# Patient Record
Sex: Female | Born: 1955 | Race: White | Hispanic: No | Smoking: Current every day smoker
Health system: Southern US, Community
[De-identification: ages and names within clinical notes are randomized; demographics above are authoritative.]

## PROBLEM LIST (undated history)

## (undated) DIAGNOSIS — M810 Age-related osteoporosis without current pathological fracture: Secondary | ICD-10-CM

## (undated) DIAGNOSIS — N879 Dysplasia of cervix uteri, unspecified: Secondary | ICD-10-CM

## (undated) DIAGNOSIS — I10 Essential (primary) hypertension: Secondary | ICD-10-CM

## (undated) DIAGNOSIS — J449 Chronic obstructive pulmonary disease, unspecified: Secondary | ICD-10-CM

## (undated) DIAGNOSIS — E785 Hyperlipidemia, unspecified: Secondary | ICD-10-CM

## (undated) HISTORY — PX: TOTAL ABDOMINAL HYSTERECTOMY W/ BILATERAL SALPINGOOPHORECTOMY: SHX83

## (undated) HISTORY — DX: Age-related osteoporosis without current pathological fracture: M81.0

## (undated) HISTORY — DX: Chronic obstructive pulmonary disease, unspecified: J44.9

## (undated) HISTORY — DX: Dysplasia of cervix uteri, unspecified: N87.9

## (undated) HISTORY — PX: APPENDECTOMY: SHX54

## (undated) HISTORY — DX: Essential (primary) hypertension: I10

## (undated) HISTORY — PX: ABDOMINAL HYSTERECTOMY: SHX81

## (undated) HISTORY — DX: Hyperlipidemia, unspecified: E78.5

---

## 2011-10-26 ENCOUNTER — Emergency Department (INDEPENDENT_AMBULATORY_CARE_PROVIDER_SITE_OTHER): Payer: BC Managed Care – PPO

## 2011-10-26 ENCOUNTER — Emergency Department
Admission: EM | Admit: 2011-10-26 | Discharge: 2011-10-26 | Disposition: A | Discharge status: Home / Self Care | Payer: BC Managed Care – PPO | Source: Home / Self Care

## 2011-10-26 ENCOUNTER — Encounter: Payer: Self-pay | Admitting: Emergency Medicine

## 2011-10-26 DIAGNOSIS — R0781 Pleurodynia: Secondary | ICD-10-CM

## 2011-10-26 DIAGNOSIS — R079 Chest pain, unspecified: Secondary | ICD-10-CM

## 2011-10-26 MED ORDER — HYDROCODONE-ACETAMINOPHEN 5-500 MG PO TABS: 1.0000 | ORAL_TABLET | Freq: Every evening | ORAL | Status: DC | PRN

## 2011-10-26 MED ORDER — TRAMADOL HCL 50 MG PO TABS: ORAL_TABLET | ORAL | Status: DC

## 2011-10-26 NOTE — ED Provider Notes (Signed)
History     CSN: [id number]  Arrival date & time 10/26/11  1541   None     Chief Complaint  Patient presents with  . Chest Pain      HPI Comments: Patient was vigorously doing yardwork about a week ago, including raking leaves. She recalls no injury, but subsequently developed right side rib pain worse with movement and inspiration.  She denies cough, and no fever.  Patient is a 56 y.o. female presenting with chest pain. The history is provided by the patient.  Chest Pain Episode onset: 1 week ago. Chest pain occurs frequently. The chest pain is unchanged. The pain is associated with breathing and coughing. At its most intense, the pain is at 8/10. The severity of the pain is moderate. The quality of the pain is described as aching. The pain does not radiate. Chest pain is worsened by certain positions and deep breathing. Pertinent negatives for primary symptoms include no fever, no fatigue, no syncope, no shortness of breath, no cough, no wheezing, no palpitations, no abdominal pain, no nausea, no vomiting and no dizziness. She tried NSAIDs for the symptoms.     History reviewed. No pertinent past medical history.  Past Surgical History  Procedure Date  . Abdominal hysterectomy   . Total abdominal hysterectomy w/ bilateral salpingoophorectomy     Family History  Problem Relation Age of Onset  . Cancer Mother   . Heart failure Father     History  Substance Use Topics  . Smoking status: Former Games developer  . Smokeless tobacco: Not on file  . Alcohol Use: Yes    OB History    Grav Para Term Preterm Abortions TAB SAB Ect Mult Living                  Review of Systems  Constitutional: Negative for fever and fatigue.  Respiratory: Negative for cough, shortness of breath and wheezing.   Cardiovascular: Positive for chest pain. Negative for palpitations and syncope.  Gastrointestinal: Negative for nausea, vomiting and abdominal pain.  Neurological: Negative for dizziness.    All other systems reviewed and are negative.    Allergies  Erythromycin; Keflex; Penicillins; and Sulfa antibiotics  Home Medications   Current Outpatient Rx  Name Route Sig Dispense Refill  . TRAMADOL HCL 50 MG PO TABS  Take one or two tabs at bedtime as needed for pain Maximum dose= 8 tablets per day 15 tablet 0    BP 167/91  Pulse 76  Temp 97.7 F (36.5 C) (Oral)  Resp 16  Ht 5\' 7"  (1.702 m)  Wt 145 lb (65.772 kg)  BMI 22.71 kg/m2  SpO2 98%  Physical Exam  Nursing note and vitals reviewed. Constitutional: She is oriented to person, place, and time. She appears well-developed and well-nourished. No distress.  HENT:  Head: Normocephalic and atraumatic.  Eyes: Conjunctivae normal are normal. Pupils are equal, round, and reactive to light.  Neck: Neck supple.  Cardiovascular: Normal heart sounds.   Pulmonary/Chest: Effort normal and breath sounds normal. No respiratory distress. She has no wheezes. She has no rales. She exhibits tenderness.  Abdominal: Soft. There is no tenderness.  Musculoskeletal:       Arms:      There is tenderness to palpation right lateral/anterior ribs as noted on diagram  Lymphadenopathy:    She has no cervical adenopathy.  Neurological: She is alert and oriented to person, place, and time.  Skin: Skin is warm and dry. No rash noted.  ED Course  Procedures none   Dg Ribs Unilateral W/chest Right  10/26/2011  *RADIOLOGY REPORT*  Clinical Data: Chest pain in the right lateral and anterior ribs for the past week.  RIGHT RIBS AND CHEST - 3+ VIEW  Comparison: No priors.  Findings: Lung volumes are normal.  No consolidative airspace disease.  No pleural effusions.  No pneumothorax.  No pulmonary nodule or mass noted.  Pulmonary vasculature and the cardiomediastinal silhouette are within normal limits.  Dedicated views of the lower right ribs demonstrate a radiopaque marker placed laterally over the point of maximal tenderness.  No acute  displaced rib fractures are identified.  IMPRESSION: 1. No radiographic evidence of acute cardiopulmonary disease. 2.  No acute displaced rib fractures are noted.   Original Report Authenticated By: Florencia Reasons, M.D.      1. Rib pain on right side       MDM  Rib belt applied.  Rx for tramadol at bedtime. Wear rib belt daytime.  Apply ice pack two or three times daily. Increase Ibuprofen 200mg , 4 tabs every 8 hours with food.  Followup with Dr. Rodney Langton if not improved two weeks.        Lattie Haw, MD 10/29/11 [id number]

## 2011-10-26 NOTE — ED Notes (Signed)
Patient states having upper/lateral right side rib pain x 1 week; associates onset with doing yardwork.

## 2011-11-04 ENCOUNTER — Ambulatory Visit (INDEPENDENT_AMBULATORY_CARE_PROVIDER_SITE_OTHER): Payer: BC Managed Care – PPO | Admitting: Family Medicine

## 2011-11-04 ENCOUNTER — Encounter: Payer: Self-pay | Admitting: Family Medicine

## 2011-11-04 VITALS — BP 169/91 | HR 81 | Wt 155.0 lb

## 2011-11-04 DIAGNOSIS — E8941 Symptomatic postprocedural ovarian failure: Secondary | ICD-10-CM

## 2011-11-04 DIAGNOSIS — E8940 Asymptomatic postprocedural ovarian failure: Secondary | ICD-10-CM

## 2011-11-04 DIAGNOSIS — I10 Essential (primary) hypertension: Secondary | ICD-10-CM | POA: Insufficient documentation

## 2011-11-04 MED ORDER — LISINOPRIL 20 MG PO TABS: 20.0000 mg | ORAL_TABLET | Freq: Every day | ORAL | Status: DC

## 2011-11-04 MED ORDER — ASPIRIN EC 81 MG PO TBEC: 81.0000 mg | DELAYED_RELEASE_TABLET | Freq: Every day | ORAL | Status: AC

## 2011-11-04 NOTE — Progress Notes (Signed)
CC: Sue Smith is a 56 y.o. female is here for Establish Care and Hypertension   Subjective: HPI:  Pleasant 56 year old here to establish care. She has the following concerns  She was seen in early October in urgent care Center and noted to have an elevated blood pressure. Since then she's been taking her blood pressure at home and in the community and notes systolics between 140 160, diastolics between 90 and 100. She has never been on antihypertensives. She tries to restrict her salt intake, avoiding canned and processed foods. She's a daily walking routine for exercise. She denies motor or sensory disturbances, irregular heartbeat, chest pain, shortness of breath, orthopnea, nor peripheral edema. She stopped smoking in 2012, cold [place].  She tells me that ever since the removal of both ovaries  in the late 1990s she suffered from hot flashes most days of the week. She was on estrogen and progesterone for matter of years but found that this was causing anxiety attacks, which had no longer been present ever since she stopped taking hormone replacement therapy. She wonders if there's anything on the market these days for hot flashes.   Her mother died from colon cancer at age 50, the patient has had colonoscopies every 5 years since the age of 74 without any abnormalities.  Review Of Systems Outlined In HPI  Past Medical History  Diagnosis Date  . Cervical dysplasia      Family History  Problem Relation Age of Onset  . Cancer Mother   . Heart failure Father      History  Substance Use Topics  . Smoking status: Former Smoker    Quit date: 01/14/2010  . Smokeless tobacco: Not on file  . Alcohol Use: 1.0 oz/week    2 drink(s) per week     Objective: Filed Vitals:   11/04/11 1408  BP: 169/91  Pulse: 81    General: Alert and Oriented, No Acute Distress HEENT: Pupils equal, round, reactive to light. Conjunctivae clear.  External ears unremarkable, canals clear with  intact TMs with appropriate landmarks.  Middle ear appears open without effusion. Pink inferior turbinates.  Moist mucous membranes, pharynx without inflammation nor lesions.  Neck supple without palpable lymphadenopathy nor abnormal masses. Lungs: Clear to auscultation bilaterally, no wheezing/ronchi/rales.  Comfortable work of breathing. Good air movement. Cardiac: Regular rate and rhythm. Normal S1/S2.  No murmurs, rubs, nor gallops.  No carotid bruits Extremities: No peripheral edema.  Strong peripheral pulses.  Mental Status: No depression, anxiety, nor agitation. Skin: Warm and dry.  Assessment & Plan: Sarai was seen today for establish care and hypertension.  Diagnoses and associated orders for this visit:  Essential hypertension - lisinopril (PRINIVIL,ZESTRIL) 20 MG tablet; Take 1 tablet (20 mg total) by mouth daily. - TSH - BASIC METABOLIC PANEL WITH GFR  Surgical menopause    We discussed different classes of antihypertensives, joint decision to start ACE inhibitor. If her basic metabolic panel is without abnormalities a call her tomorrow to start lisinopril. She'll return in 1-2 weeks for a blood pressure recheck and a complete physical exam, fasting. I've asked her to start a baby aspirin.  Return in about 2 weeks (around 11/18/2011) for cpe.

## 2011-11-05 LAB — BASIC METABOLIC PANEL WITH GFR
BUN: 11 mg/dL (ref 6–23)
CO2: 32 mEq/L (ref 19–32)
Chloride: 104 mEq/L (ref 96–112)
Creat: 0.84 mg/dL (ref 0.50–1.10)
Glucose, Bld: 104 mg/dL — ABNORMAL HIGH (ref 70–99)
Potassium: 4.2 mEq/L (ref 3.5–5.3)

## 2011-11-18 ENCOUNTER — Ambulatory Visit (INDEPENDENT_AMBULATORY_CARE_PROVIDER_SITE_OTHER): Payer: BC Managed Care – PPO | Admitting: Family Medicine

## 2011-11-18 ENCOUNTER — Encounter: Payer: Self-pay | Admitting: Family Medicine

## 2011-11-18 VITALS — BP 140/79 | HR 67 | Ht 67.0 in | Wt 154.0 lb

## 2011-11-18 DIAGNOSIS — Z8739 Personal history of other diseases of the musculoskeletal system and connective tissue: Secondary | ICD-10-CM

## 2011-11-18 DIAGNOSIS — I10 Essential (primary) hypertension: Secondary | ICD-10-CM

## 2011-11-18 DIAGNOSIS — Z0000 Encounter for general adult medical examination without abnormal findings: Secondary | ICD-10-CM

## 2011-11-18 DIAGNOSIS — R232 Flushing: Secondary | ICD-10-CM

## 2011-11-18 MED ORDER — ESTRADIOL 1 MG PO TABS: ORAL_TABLET | ORAL | Status: DC

## 2011-11-18 NOTE — Patient Instructions (Addendum)
Dr. Coda Mathey's General Advice Following Your Complete Physical Exam  The Benefits of Regular Exercise: Unless you suffer from an uncontrolled cardiovascular condition, studies strongly suggest that regular exercise and physical activity will add to both the quality and length of your life.  The World Health Organization recommends 150 minutes of moderate intensity aerobic activity every week.  This is best split over 3-4 days a week, and can be as simple as a brisk walk for just over 35 minutes "most days of the week".  This type of exercise has been shown to lower LDL-Cholesterol, lower average blood sugars, lower blood pressure, lower cardiovascular disease risk, improve memory, and increase one's overall sense of wellbeing.  The addition of anaerobic (or "strength training") exercises offers additional benefits including but not limited to increased metabolism, prevention of osteoporosis, and improved overall cholesterol levels.  How Can I Strive For A Low-Fat Diet?: Current guidelines recommend that 25-35 percent of your daily energy (food) intake should come from fats.  One might ask how can this be achieved without having to dissect each meal on a daily basis?  Switch to skim or 1% milk instead of whole milk.  Focus on lean meats such as ground turkey, fresh fish, baked chicken, and lean cuts of beef as your source of dietary protein.  Consume less than 300mg/day of dietary cholesterol.  Limit trans fatty acid consumption primarily by limiting synthetic trans fats such as partially hydrogenated oils (Ex: fried fast foods).  Focus efforts on reducing your intake of "solid" fats (Ex: Butter).  Substitute olive or vegetable oil for solid fats where possible.  Moderation of Salt Intake: Provided you don't carry a diagnosis of congestive heart failure nor renal failure, I recommend a daily allowance of no more than 2300 mg of salt (sodium).  Keeping under this daily goal is associated with a  decreased risk of cardiovascular events, creeping above it can lead to elevated blood pressures and increases your risk of cardiovascular events.  Milligrams (mg) of salt is listed on all nutrition labels, and your daily intake can add up faster than you think.  Most canned and frozen dinners can pack in over half your daily salt allowance in one meal.    Lifestyle Health Risks: Certain lifestyle choices carry specific health risks.  As you may already know, tobacco use has been associated with increasing one's risk of cardiovascular disease, pulmonary disease, numerous cancers, among many other issues.  What you may not know is that there are medications and nicotine replacement strategies that can more than double your chances of successfully quitting.  I would be thrilled to help manage your quitting strategy if you currently use tobacco products.  When it comes to alcohol use, I've yet to find an "ideal" daily allowance.  Provided an individual does not have a medical condition that is exacerbated by alcohol consumption, general guidelines determine "safe drinking" as no more than two standard drinks for a man or no more than one standard drink for a female per day.  However, much debate still exists on whether any amount of alcohol consumption is technically "safe".  My general advice, keep alcohol consumption to a minimum for general health promotion.  If you or others believe that alcohol, tobacco, or recreational drug use is interfering with your life, I would be happy to provide confidential counseling regarding treatment options.  General "Over The Counter" Nutrition Advice: Postmenopausal women should aim for a daily calcium intake of 1200 mg, however a significant   portion of this might already be provided by diets including milk, yogurt, cheese, and other dairy products.  Vitamin D has been shown to help preserve bone density, prevent fatigue, and has even been shown to help reduce falls in the  elderly.  Ensuring a daily intake of 800 Units of Vitamin D is a good place to start to enjoy the above benefits, we can easily check your Vitamin D level to see if you'd potentially benefit from supplementation beyond 800 Units a day.  Folic Acid intake should be of particular concern to women of childbearing age.  Daily consumption of 400-800 mcg of Folic Acid is recommended to minimize the chance of spinal cord defects in a fetus should pregnancy occur.    For many adults, accidents still remain one of the most common culprits when it comes to cause of death.  Some of the simplest but most effective preventitive habits you can adopt include regular seatbelt use, proper helmet use, securing firearms, and regularly testing your smoke and carbon monoxide detectors.  Ryu Cerreta B. Jahmire Ruffins DO [facility] [address], [address] [place], [place] [address] Phone: [phone]  

## 2011-11-18 NOTE — Progress Notes (Signed)
CC: Sue Smith is a 56 y.o. female is here for Annual Exam   Colonoscopy: Her mother died from colon cancer at age 64, the patient has had colonoscopies every 5 years since the age of 89 without any abnormalities. She has one planned for next year.  Papsmear: Total hysterectomy and has had 5 negative Pap smears since, not indicated today. Mammogram: Order placed for screening mammogram, no history of abnormalities.  DEXA: She tells me she had a DEXA scan years ago that showed osteoporosis, she's not been on medication for this, she'll bring in records reflecting this for my review.  Influenza Vaccine: Declines Pneumovax: Believes 2009 last admin Td/Tdap: Believes 2009 last admin Zoster: (Start 56 yo)  Subjective: HPI:  Here for complete physical exam and would like to discuss restarting hormone replacement therapy. When she had her total abdominal hysterectomy both ovaries were removed, she was on estrogen and progesterone for a few months but it caused anxiety so she stopped both. She has no history of blood clots nor family history or personal history of breast cancer. She would like to restart estrogen. She reports numerous daily hot flashes that in her quality of life, often interrupting sleep.  Over the past 2 weeks have you been bothered by: - Little interest or pleasure in doing things: No - Feeling down depressed or hopeless: No  She tries to watch what she eats, no formal exercise program but tries to stay active with yard work and chores. Former smoker, less than social alcohol use.  She had a cough after starting on lisinopril, she believes it is getting better but it returns or fail to improve she'll call the office for consideration of starting an ARB instead of lisinopril. She brings in blood pressures from home with systolics ranging from 115 140, and diastolics in the normotensive range.   Review of Systems - General ROS: negative for - chills, fever, night sweats,  weight gain or weight loss Ophthalmic ROS: negative for - decreased vision Psychological ROS: negative for - anxiety or depression ENT ROS: negative for - hearing change, nasal congestion, tinnitus or allergies Hematological and Lymphatic ROS: negative for - bleeding problems, bruising or swollen lymph nodes Breast ROS: negative Respiratory ROS: no shortness of breath, or wheezing Cardiovascular ROS: no chest pain or dyspnea on exertion Gastrointestinal ROS: no abdominal pain, change in bowel habits, or black or bloody stools Genito-Urinary ROS: negative for - genital discharge, genital ulcers, incontinence or abnormal bleeding from genitals Musculoskeletal ROS: negative for - joint pain or muscle pain Neurological ROS: negative for - headaches or memory loss Dermatological ROS: negative for lumps, mole changes, rash and skin lesion changes  Past Medical History  Diagnosis Date  . Cervical dysplasia      Family History  Problem Relation Age of Onset  . Cancer Mother   . Heart failure Father      History  Substance Use Topics  . Smoking status: Former Smoker    Quit date: 01/14/2010  . Smokeless tobacco: Not on file  . Alcohol Use: 1.0 oz/week    2 drink(s) per week     Objective: Filed Vitals:   11/18/11 0824  BP: 140/79  Pulse: 67    GGeneral: No Acute Distress HEENT: Atraumatic, normocephalic, conjunctivae normal without scleral icterus.  No nasal discharge, hearing grossly intact, TMs with good landmarks bilaterally with no middle ear abnormalities, posterior pharynx clear without oral lesions. Neck: Supple, trachea midline, no cervical nor supraclavicular adenopathy. Pulmonary:  Clear to auscultation bilaterally without wheezing, rhonchi, nor rales. Cardiac: Regular rate and rhythm.  No murmurs, rubs, nor gallops. No peripheral edema.  2+ peripheral pulses bilaterally. Abdomen: Bowel sounds normal.  No masses.  Non-tender without rebound.  Negative Murphy's sign. GU:  Deferred by patient  MSK: Grossly intact, no signs of weakness.  Full strength throughout upper and lower extremities.  Full ROM in upper and lower extremities.  No midline spinal tenderness. Neuro: Gait unremarkable, CN II-XII grossly intact.  C5-C6 Reflex 2/4 Bilaterally, L4 Reflex 2/4 Bilaterally.  Cerebellar function intact. Skin: No rashes. Psych: Alert and oriented to person/place/time.  Thought process normal. No anxiety/depression.   Assessment & Plan: [name] was seen today for annual exam.  Diagnoses and associated orders for this visit:  Annual physical exam - MM Digital Screening; Future - Lipid panel - HgB A1c  History of osteoporosis  Vasomotor flushing  Essential hypertension  Other Orders - Discontinue: estradiol (ESTRACE) 1 MG tablet; One daily for 21 days, followed by 7 days without, then repeat. - estradiol (ESTRACE) 1 MG tablet; One daily for 21 days, followed by 7 days without, then repeat.    Depression screen negative, we'll reassess next year. We discussed healthy lifestyle options and handout was given to the patient stressing the importance of calcium and vitamin D intake. She'll be dropping off outside DEXA scan for my review, we'll need to see if she should should start a bisphosphonate. Return in 3 months for blood pressure management, sooner if the above labs necessitate a sooner followup.  Return in about 3 months (around 02/18/2012).

## 2011-11-19 LAB — HEMOGLOBIN A1C
Hgb A1c MFr Bld: 5.7 % — ABNORMAL HIGH (ref <5.7)
Mean Plasma Glucose: 117 mg/dL — ABNORMAL HIGH (ref <117)

## 2011-11-19 LAB — LIPID PANEL
HDL: 49 mg/dL (ref >39)
Total CHOL/HDL Ratio: 4.7 Ratio
VLDL: 26 mg/dL (ref 0–40)

## 2011-11-20 ENCOUNTER — Telehealth: Payer: Self-pay | Admitting: Family Medicine

## 2011-11-20 DIAGNOSIS — R7303 Prediabetes: Secondary | ICD-10-CM | POA: Insufficient documentation

## 2011-11-20 NOTE — Telephone Encounter (Signed)
[place] 3% Risk Goal LDL < 160, she does not want to start a statin to minimize her risk by approximately one fifth.  She already takes an asprin.  Stressed the importance of regular exercise.  RTC 3 months. Discused pre-diabetes

## 2011-11-26 ENCOUNTER — Encounter: Payer: Self-pay | Admitting: Family Medicine

## 2012-02-10 ENCOUNTER — Ambulatory Visit (INDEPENDENT_AMBULATORY_CARE_PROVIDER_SITE_OTHER): Payer: BC Managed Care – PPO | Admitting: Family Medicine

## 2012-02-10 ENCOUNTER — Encounter: Payer: Self-pay | Admitting: Family Medicine

## 2012-02-10 VITALS — BP 143/82 | HR 79 | Temp 98.0°F | Wt 155.0 lb

## 2012-02-10 DIAGNOSIS — Z8739 Personal history of other diseases of the musculoskeletal system and connective tissue: Secondary | ICD-10-CM

## 2012-02-10 DIAGNOSIS — I10 Essential (primary) hypertension: Secondary | ICD-10-CM

## 2012-02-10 DIAGNOSIS — R300 Dysuria: Secondary | ICD-10-CM

## 2012-02-10 DIAGNOSIS — M549 Dorsalgia, unspecified: Secondary | ICD-10-CM

## 2012-02-10 DIAGNOSIS — Z800 Family history of malignant neoplasm of digestive organs: Secondary | ICD-10-CM

## 2012-02-10 LAB — POCT URINALYSIS DIPSTICK
Nitrite, UA: NEGATIVE
Spec Grav, UA: 1.02
Urobilinogen, UA: 0.2
pH, UA: 6.5

## 2012-02-10 MED ORDER — OXYCODONE-ACETAMINOPHEN 5-325 MG PO TABS: 1.0000 | ORAL_TABLET | Freq: Four times a day (QID) | ORAL | Status: DC | PRN

## 2012-02-10 MED ORDER — CIPROFLOXACIN HCL 250 MG PO TABS: ORAL_TABLET | ORAL | Status: AC

## 2012-02-10 NOTE — Progress Notes (Signed)
CC: Sue Smith is a 57 y.o. female is here for Back Pain and Dysuria   Subjective: HPI:  Patient complains of dysuria. Has been present for 3 days, worsening on a daily basis. She describes it as a discomfort in her lower back that radiates around the pelvis ending at the urethra. Described as a moderate pressure and burning sensation that is severe with urinating. Complaints of fatigue since this started. Has tried Azo, acetaminophen, no improvement whatsoever. Complains of the odor to her urine but denies clots, nor change in color or  consistency otherwise of her urine.  History of essential hypertension: Is taking lisinopril a daily basis. Reports systolics at home of 120 to 130. Reports diastolics in the 70s. Denies chest pain, shortness of breath, orthopnea, peripheral edema, cough, PND, nor facial or lip swelling  History of osteoporosis per her report. She is taking 1200 mg calcium, 800 units vitamin D on a daily basis. She is unsure what her T-scores were, she has been meaning to Provide [place] with her most recent DEXA scan. She's not taking and has not taken bisphosphonates.. she denies a history of fractures.      Review Of Systems Outlined In HPI  Past Medical History  Diagnosis Date  . Cervical dysplasia      Family History  Problem Relation Age of Onset  . Cancer Mother   . Heart failure Father      History  Substance Use Topics  . Smoking status: Former Smoker    Quit date: 01/14/2010  . Smokeless tobacco: Not on file  . Alcohol Use: 1.0 oz/week    2 drink(s) per week     Objective: Filed Vitals:   02/10/12 0847  BP: 143/82  Pulse: 79  Temp: 98 F (36.7 C)    General: Alert and Oriented, No Acute Distress HEENT: Pupils equal, round, reactive to light. Conjunctivae clear.   moist mucous membranes  Lungs: Clear to auscultation bilaterally, no wheezing/ronchi/rales.  Comfortable work of breathing. Good air movement. Cardiac: Regular rate and rhythm.  Normal S1/S2.  No murmurs, rubs, nor gallops.   Abdomen:  soft and nontender  Back: No CVA tenderness nor midline pain the spinous processes  Extremities: No peripheral edema.  Strong peripheral pulses.  Mental Status: No depression, anxiety, nor agitation. Skin: Warm and dry.  Assessment & Plan: Sue Smith was seen today for back pain and dysuria.  Diagnoses and associated orders for this visit:  Back pain - Urinalysis Dipstick - Urine Culture  Dysuria - Urinalysis Dipstick - Urine Culture - ciprofloxacin (CIPRO) 250 MG tablet; Take one by mouth twice a day for five days. - oxyCODONE-acetaminophen (PERCOCET/ROXICET) 5-325 MG per tablet; Take 1 tablet by mouth every 6 (six) hours as needed for pain.  Essential hypertension  History of osteoporosis  Family history of colon cancer    Urinary tract infection: UA with small blood, low suspicion for nephrolithiasis. Empiric treatment with Cipro, will follow culture. Given severity of pain small regimen of Percocet.Signs and symptoms requring emergent/urgent reevaluation were discussed with the patient. History of osteoporosis: I've asked her to provide [place] with her most recent T-scores or results and if less than -2.5 entry diagnosis of osteoporosis she will consider bisphosphonate. Continue calcium and vitamin D Essential hypertension: Controlled, stable per home blood pressure report.  Return if symptoms worsen or fail to improve.

## 2012-02-12 LAB — URINE CULTURE: Organism ID, Bacteria: NO GROWTH

## 2012-02-29 ENCOUNTER — Other Ambulatory Visit: Payer: Self-pay

## 2012-03-25 ENCOUNTER — Encounter: Payer: Self-pay | Admitting: Family Medicine

## 2012-03-25 DIAGNOSIS — R232 Flushing: Secondary | ICD-10-CM

## 2012-03-25 MED ORDER — ESTRADIOL 0.5 MG PO TABS: ORAL_TABLET | ORAL | Status: DC

## 2012-03-25 NOTE — Telephone Encounter (Signed)
Mrs. Hauk, This might actually be a reflection that the level of estrogen is too much especially since you're having hot flashes while taking it.  I've called in a lower dose to your [facility], 0.5mg  instead of 1mg .  You can take this on a daily basis, no longer taking seven days off since that was making matters worse.  Let me know if the hot flashes behave the same way a month after new regimen.  Take care, -Laren Boom

## 2012-03-26 ENCOUNTER — Encounter: Payer: Self-pay | Admitting: Family Medicine

## 2012-03-26 DIAGNOSIS — Z860100 Personal history of colon polyps, unspecified: Secondary | ICD-10-CM | POA: Insufficient documentation

## 2012-06-30 ENCOUNTER — Encounter: Payer: Self-pay | Admitting: Family Medicine

## 2012-07-02 ENCOUNTER — Encounter: Payer: Self-pay | Admitting: Family Medicine

## 2012-08-06 ENCOUNTER — Encounter: Payer: Self-pay | Admitting: Family Medicine

## 2012-08-06 ENCOUNTER — Telehealth: Payer: Self-pay | Admitting: Family Medicine

## 2012-08-06 ENCOUNTER — Ambulatory Visit (INDEPENDENT_AMBULATORY_CARE_PROVIDER_SITE_OTHER): Payer: BC Managed Care – PPO | Admitting: Family Medicine

## 2012-08-06 ENCOUNTER — Ambulatory Visit (INDEPENDENT_AMBULATORY_CARE_PROVIDER_SITE_OTHER): Payer: BC Managed Care – PPO

## 2012-08-06 VITALS — BP 156/91 | HR 75 | Wt 155.0 lb

## 2012-08-06 DIAGNOSIS — M545 Low back pain, unspecified: Secondary | ICD-10-CM

## 2012-08-06 DIAGNOSIS — Z8739 Personal history of other diseases of the musculoskeletal system and connective tissue: Secondary | ICD-10-CM

## 2012-08-06 DIAGNOSIS — M5137 Other intervertebral disc degeneration, lumbosacral region: Secondary | ICD-10-CM

## 2012-08-06 MED ORDER — OXYCODONE-ACETAMINOPHEN 5-325 MG PO TABS: 1.0000 | ORAL_TABLET | Freq: Two times a day (BID) | ORAL | Status: DC | PRN

## 2012-08-06 MED ORDER — PREDNISONE 20 MG PO TABS: ORAL_TABLET | ORAL | Status: AC

## 2012-08-06 NOTE — Telephone Encounter (Signed)
Pt.notified

## 2012-08-06 NOTE — Telephone Encounter (Signed)
Sue Smith,  Will you please let [name] know that her xray does not show any sign of a compression fracture, I"d like her to start a prednisone taper that i have sent to [facility].  The xray does show some degenerative disc disease where I reproduced her pain when pressing on her back, this is likely the cause of her pain and should respond to prednisone.  I'd like her to f/u once she's doen with the taper.

## 2012-08-06 NOTE — Progress Notes (Signed)
CC: Sue Smith is a 57 y.o. female is here for Back Pain   Subjective: HPI:  Patient complains of low back pain that has been present for 2 months slowly worsening moderate severity now is on a daily basis. It is worse when standing for long periods of time it is localized to the midline of the back and will radiate through the left or right buttock into respective hamstrings never passing the knee. Pain is improved with flexion pain is not reproduced with extension and is relatively unchanged in that position. She denies saddle paresthesia nor bowel or bladder incontinence nor motor or sensory disturbances she denies recent or remote trauma. She does have a history of osteoporosis continues to take calcium and vitamin D daily. Pain can be occurring anytime of the day. Is described only as a pain. She denies night sweats, unintentional weight loss, abdominal pain, pelvic pain, cough, shortness of breath nor constipation. She has been using ibuprofen 800 mg 3 times a day without improvement of pain   Review Of Systems Outlined In HPI  Past Medical History  Diagnosis Date  . Cervical dysplasia      Family History  Problem Relation Age of Onset  . Cancer Mother   . Heart failure Father      History  Substance Use Topics  . Smoking status: Former Smoker    Quit date: 01/14/2010  . Smokeless tobacco: Not on file  . Alcohol Use: 1.0 oz/week    2 drink(s) per week     Objective: Filed Vitals:   08/06/12 1028  BP: 156/91  Pulse: 75    General: Alert and Oriented, No Acute Distress HEENT: Pupils equal, round, reactive to light. Conjunctivae clear.  Moist mucous membranes pharynx unremarkable Lungs: Clear to auscultation bilaterally, no wheezing/ronchi/rales.  Comfortable work of breathing. Good air movement. Cardiac: Regular rate and rhythm. Normal S1/S2.  No murmurs, rubs, nor gallops.   Extremities: No peripheral edema.  Strong peripheral pulses.  Full range of motion  strength bilaterally L4-S1 DTRs two over four. Straight leg raise is negative, long roll negative FABER/FADIR negative, bilaterally Back: Midline spinous process tenderness at L5 no paraspinal tenderness nor sacroiliac pain with deep palpation. Pain is not reproduced with flexion or extension however stork test with left leg planted is positive Mental Status: No depression, anxiety, nor agitation. Skin: Warm and dry.  Assessment & Plan: Sue Smith was seen today for back pain.  Diagnoses and associated orders for this visit:  History of osteoporosis - DG Lumbar Spine Complete; Future  Low back pain - oxyCODONE-acetaminophen (PERCOCET/ROXICET) 5-325 MG per tablet; Take 1 tablet by mouth every 12 (twelve) hours as needed for pain. - DG Lumbar Spine Complete; Future    Discussed with patient due to history of osteoporosis she is at risk for compression fracture I would like to rule this out she will obtain lumbar films today. If this is ruled out I'm still suspicious of facet pathology, she was given handout on home physical therapy medication regimen beyond Percocet will depend on plain films.   Return in about 4 weeks (around 09/03/2012), or if symptoms worsen or fail to improve.

## 2012-09-16 ENCOUNTER — Ambulatory Visit (INDEPENDENT_AMBULATORY_CARE_PROVIDER_SITE_OTHER): Payer: BC Managed Care – PPO | Admitting: Family Medicine

## 2012-09-16 ENCOUNTER — Encounter: Payer: Self-pay | Admitting: Family Medicine

## 2012-09-16 VITALS — BP 141/89 | HR 82 | Temp 97.8°F | Wt 154.0 lb

## 2012-09-16 DIAGNOSIS — M545 Low back pain, unspecified: Secondary | ICD-10-CM

## 2012-09-16 DIAGNOSIS — R300 Dysuria: Secondary | ICD-10-CM

## 2012-09-16 DIAGNOSIS — N390 Urinary tract infection, site not specified: Secondary | ICD-10-CM

## 2012-09-16 LAB — POCT URINALYSIS DIPSTICK
Bilirubin, UA: NEGATIVE
Glucose, UA: NEGATIVE
Ketones, UA: NEGATIVE
Nitrite, UA: POSITIVE
pH, UA: 6.5

## 2012-09-16 MED ORDER — CIPROFLOXACIN HCL 500 MG PO TABS: 500.0000 mg | ORAL_TABLET | Freq: Two times a day (BID) | ORAL | Status: DC

## 2012-09-16 MED ORDER — OXYCODONE-ACETAMINOPHEN 5-325 MG PO TABS: 1.0000 | ORAL_TABLET | Freq: Two times a day (BID) | ORAL | Status: DC | PRN

## 2012-09-16 NOTE — Progress Notes (Signed)
CC: Sue Smith is a 57 y.o. female is here for Dysuria   Subjective: HPI:  Patient complains of dysuria of moderate severity described as a burning at urethra along with sensation of bladder spasms that has been worsening over the past 3 days it is present all hours of the day, she has tried peridium without much benefit, she had left over oxycodone from her back pain which completely resolved her urinary complaints however only for approximately 2-3 hours nothing else seems to make better or worse. This morning it began radiating up the right lower quadrant. She has a history of pyelonephritis years ago requiring hospitalization. Currently she denies fevers, chills, flank pain, nausea, vomiting, abdominal pain, vaginal discharge constipation nor diarrhea   Review Of Systems Outlined In HPI  Past Medical History  Diagnosis Date  . Cervical dysplasia      Family History  Problem Relation Age of Onset  . Cancer Mother   . Heart failure Father      History  Substance Use Topics  . Smoking status: Former Smoker    Quit date: 01/14/2010  . Smokeless tobacco: Not on file  . Alcohol Use: 1.0 oz/week    2 drink(s) per week     Objective: Filed Vitals:   09/16/12 0827  BP: 141/89  Pulse: 82  Temp: 97.8 F (36.6 C)    General: Alert and Oriented, No Acute Distress HEENT: Pupils equal, round, reactive to light. Conjunctivae clear.   moist mucous membranes  Lungs: Clear to auscultation bilaterally, no wheezing/ronchi/rales.  Comfortable work of breathing. Good air movement. Cardiac: Regular rate and rhythm. Normal S1/S2.  No murmurs, rubs, nor gallops.   Abdomen:  soft nontender no rebound  Back: No CVA tenderness  Mental Status: No depression, anxiety, nor agitation. Skin: Warm and dry.  Assessment & Plan: Sue Smith was seen today for dysuria.  Diagnoses and associated orders for this visit:  Dysuria - Urinalysis Dipstick - Urine Culture  UTI (urinary tract  infection) - ciprofloxacin (CIPRO) 500 MG tablet; Take 1 tablet (500 mg total) by mouth 2 (two) times daily.  Low back pain - oxyCODONE-acetaminophen (PERCOCET/ROXICET) 5-325 MG per tablet; Take 1 tablet by mouth every 12 (twelve) hours as needed for pain.    Urinalysis with blood and nitrates we'll start with Cipro high-dose given her history of pyelonephritis I will follow the culture. Small prescription of oxycodone given to help with pain however she will call us in 48 hours if not significantly better.   Return if symptoms worsen or fail to improve.

## 2012-09-18 LAB — URINE CULTURE: Colony Count: >=100000

## 2012-09-25 ENCOUNTER — Encounter: Payer: BC Managed Care – PPO | Admitting: Family Medicine

## 2012-10-13 ENCOUNTER — Ambulatory Visit (INDEPENDENT_AMBULATORY_CARE_PROVIDER_SITE_OTHER): Payer: BC Managed Care – PPO | Admitting: Family Medicine

## 2012-10-13 ENCOUNTER — Encounter: Payer: Self-pay | Admitting: Family Medicine

## 2012-10-13 VITALS — BP 147/87 | HR 82 | Temp 97.7°F | Wt 155.0 lb

## 2012-10-13 DIAGNOSIS — I10 Essential (primary) hypertension: Secondary | ICD-10-CM

## 2012-10-13 DIAGNOSIS — M545 Low back pain, unspecified: Secondary | ICD-10-CM

## 2012-10-13 DIAGNOSIS — J209 Acute bronchitis, unspecified: Secondary | ICD-10-CM

## 2012-10-13 MED ORDER — LISINOPRIL 20 MG PO TABS: 20.0000 mg | ORAL_TABLET | Freq: Every day | ORAL | Status: DC

## 2012-10-13 MED ORDER — OXYCODONE-ACETAMINOPHEN 5-325 MG PO TABS: 1.0000 | ORAL_TABLET | Freq: Two times a day (BID) | ORAL | Status: DC | PRN

## 2012-10-13 MED ORDER — LEVOFLOXACIN 500 MG PO TABS: 500.0000 mg | ORAL_TABLET | Freq: Every day | ORAL | Status: DC

## 2012-10-13 NOTE — Progress Notes (Signed)
CC: Sue Smith is a 57 y.o. female is here for Nasal Congestion and Cough   Subjective: HPI:  Complains of productive cough that has been present for 3 weeks has been worsening on a weekly basis. Described as producing thick yellow sputum without blood. Present all hours of the day nothing particularly makes it better or worse. Associated with shortness of breath this weekend which prompted today's visit she's unsure about fevers or chills but there is been no change to her hot flashes. Denies cold sweats at night. Denies chest pain wheezing confusion, headache, nausea. Has had accompanying facial pressure and nasal congestion this past week.  Followup essential hypertension: At her last visit she has been hypertensive state she has run out of her lisinopril she has no outside blood pressures to report. She denies chest pain, motor sensory disturbances.  Followup low back pain: Patient reports drastic improvement with low back pain if she takes half of a Percocet in the evening. Pain is still low in the midline of the back nonradiating without bowel or bladder dysfunction nor saddle paresthesia. She states pain is improving on a monthly basis.   Review Of Systems Outlined In HPI  Past Medical History  Diagnosis Date  . Cervical dysplasia      Family History  Problem Relation Age of Onset  . Cancer Mother   . Heart failure Father      History  Substance Use Topics  . Smoking status: Former Smoker    Quit date: 01/14/2010  . Smokeless tobacco: Not on file  . Alcohol Use: 1.0 oz/week    2 drink(s) per week     Objective: Filed Vitals:   10/13/12 0821  BP: 147/87  Pulse: 82  Temp: 97.7 F (36.5 C)    General: Alert and Oriented, No Acute Distress HEENT: Pupils equal, round, reactive to light. Conjunctivae clear.  External ears unremarkable, canals clear with intact TMs with appropriate landmarks.  Middle ear appears open without effusion. Pink inferior turbinates.   Moist mucous membranes, pharynx without inflammation nor lesions However moderate cobblestoning.  Neck supple without palpable lymphadenopathy nor abnormal masses. Lungs:  trace rhonchi in the central lung fields no rales nor wheezing nor signs of consolidation.  Comfortable work of breathing. Good air movement.Frequent coughing during exam  Cardiac: Regular rate and rhythm. Normal S1/S2.  No murmurs, rubs, nor gallops.   Extremities: No peripheral edema.  Strong peripheral pulses.  Mental Status: No depression, anxiety, nor agitation. Skin: Warm and dry.  Assessment & Plan: Sue Smith was seen today for nasal congestion and cough.  Diagnoses and associated orders for this visit:  Acute bronchitis - levofloxacin (LEVAQUIN) 500 MG tablet; Take 1 tablet (500 mg total) by mouth daily.  Low back pain - oxyCODONE-acetaminophen (PERCOCET/ROXICET) 5-325 MG per tablet; Take 1 tablet by mouth every 12 (twelve) hours as needed for pain.  Essential hypertension - lisinopril (PRINIVIL,ZESTRIL) 20 MG tablet; Take 1 tablet (20 mg total) by mouth daily.    Essential hypertension: Uncontrolled chronic condition encouraged her to restart lisinopril refills provided Low back pain: Improving continue as needed Percocet for sleep   acute bronchitis: She has a complicated list of allergies we will try levofloxacin if affordable, if no improvement in 2 days will add prednisone   Return if symptoms worsen or fail to improve.

## 2012-10-15 ENCOUNTER — Encounter: Payer: Self-pay | Admitting: Family Medicine

## 2012-10-21 ENCOUNTER — Encounter: Payer: Self-pay | Admitting: Family Medicine

## 2012-10-26 ENCOUNTER — Telehealth: Payer: Self-pay | Admitting: Family Medicine

## 2012-10-26 DIAGNOSIS — R05 Cough: Secondary | ICD-10-CM

## 2012-10-26 DIAGNOSIS — R059 Cough, unspecified: Secondary | ICD-10-CM

## 2012-10-26 MED ORDER — PREDNISONE 20 MG PO TABS: ORAL_TABLET | ORAL | Status: AC

## 2012-10-26 NOTE — Telephone Encounter (Signed)
Continued cough, adding prednisone taper

## 2012-11-19 ENCOUNTER — Other Ambulatory Visit: Payer: Self-pay

## 2012-11-24 ENCOUNTER — Ambulatory Visit (INDEPENDENT_AMBULATORY_CARE_PROVIDER_SITE_OTHER): Payer: Self-pay

## 2012-11-24 ENCOUNTER — Encounter: Payer: Self-pay | Admitting: Family Medicine

## 2012-11-24 ENCOUNTER — Ambulatory Visit (INDEPENDENT_AMBULATORY_CARE_PROVIDER_SITE_OTHER): Payer: BC Managed Care – PPO

## 2012-11-24 ENCOUNTER — Ambulatory Visit (INDEPENDENT_AMBULATORY_CARE_PROVIDER_SITE_OTHER): Payer: BC Managed Care – PPO | Admitting: Family Medicine

## 2012-11-24 VITALS — BP 137/76 | HR 72 | Wt 153.0 lb

## 2012-11-24 DIAGNOSIS — R928 Other abnormal and inconclusive findings on diagnostic imaging of breast: Secondary | ICD-10-CM

## 2012-11-24 DIAGNOSIS — E785 Hyperlipidemia, unspecified: Secondary | ICD-10-CM

## 2012-11-24 DIAGNOSIS — M810 Age-related osteoporosis without current pathological fracture: Secondary | ICD-10-CM

## 2012-11-24 DIAGNOSIS — M545 Low back pain, unspecified: Secondary | ICD-10-CM

## 2012-11-24 DIAGNOSIS — Z1239 Encounter for other screening for malignant neoplasm of breast: Secondary | ICD-10-CM

## 2012-11-24 DIAGNOSIS — R7303 Prediabetes: Secondary | ICD-10-CM

## 2012-11-24 DIAGNOSIS — I10 Essential (primary) hypertension: Secondary | ICD-10-CM

## 2012-11-24 DIAGNOSIS — R5383 Other fatigue: Secondary | ICD-10-CM

## 2012-11-24 DIAGNOSIS — R5381 Other malaise: Secondary | ICD-10-CM

## 2012-11-24 DIAGNOSIS — R7309 Other abnormal glucose: Secondary | ICD-10-CM

## 2012-11-24 DIAGNOSIS — Z0000 Encounter for general adult medical examination without abnormal findings: Secondary | ICD-10-CM

## 2012-11-24 DIAGNOSIS — Z1231 Encounter for screening mammogram for malignant neoplasm of breast: Secondary | ICD-10-CM

## 2012-11-24 LAB — LIPID PANEL
Cholesterol: 260 mg/dL — ABNORMAL HIGH (ref 0–200)
Total CHOL/HDL Ratio: 5.8 Ratio

## 2012-11-24 LAB — CBC
HCT: 45.6 % (ref 36.0–46.0)
Hemoglobin: 15.3 g/dL — ABNORMAL HIGH (ref 12.0–15.0)
MCV: 94.2 fL (ref 78.0–100.0)
RBC: 4.84 MIL/uL (ref 3.87–5.11)
WBC: 6 10*3/uL (ref 4.0–10.5)

## 2012-11-24 LAB — HEMOGLOBIN A1C
Hgb A1c MFr Bld: 5.7 % — ABNORMAL HIGH (ref <5.7)
Mean Plasma Glucose: 117 mg/dL — ABNORMAL HIGH (ref <117)

## 2012-11-24 LAB — BASIC METABOLIC PANEL WITH GFR
CO2: 26 mEq/L (ref 19–32)
Chloride: 106 mEq/L (ref 96–112)
GFR, Est Non African American: 74 mL/min
Potassium: 4.9 mEq/L (ref 3.5–5.3)

## 2012-11-24 MED ORDER — OXYCODONE-ACETAMINOPHEN 5-325 MG PO TABS: 1.0000 | ORAL_TABLET | Freq: Two times a day (BID) | ORAL | Status: DC | PRN

## 2012-11-24 NOTE — Progress Notes (Signed)
CC: Sue Smith is a 57 y.o. female is here for Annual Exam   Subjective: HPI:  Colonoscopy: Her mother died from colon cancer at age 37, the patient has had colonoscopies every 5 years since the age of 44 without any abnormalities.  06/30/2012 with repeat in 5 years.  Papsmear: Total hysterectomy and has had 5 negative Pap smears since, not indicated today.  Mammogram: Overdue, referral placed today  DEXA: No prior records, last obtained 2009, repeat today.  Influenza Vaccine: due today Pneumovax: Pneumovax 2009, UTD Td/Tdap: Tdap 2009, UTD Zoster: (Start 55 yo)  Over the past 2 weeks have you been bothered by: - Little interest or pleasure in doing things: No - Feeling down depressed or hopeless: No  Daily smoker, no alcohol use, no recreational drug use. No formal physical activity tries to watch her she eats return for improvement  Her only complaint involves fatigue over the past month mild in severity present on a daily basis seems to be getting worse lites are getting longer. Denies shortness of breath, nor motor or sensory disturbances nor weakness.  Back pain is doing great taking only one percocet screen for anemia given her report of less than most days of the week  Review of Systems - General ROS: negative for - chills, fever, night sweats, weight gain or weight loss Ophthalmic ROS: negative for - decreased vision Psychological ROS: negative for - anxiety or depression ENT ROS: negative for - hearing change, nasal congestion, tinnitus or allergies Hematological and Lymphatic ROS: negative for - bleeding problems, bruising or swollen lymph nodes Breast ROS: negative Respiratory ROS: no cough, shortness of breath, or wheezing Cardiovascular ROS: no chest pain or dyspnea on exertion Gastrointestinal ROS: no abdominal pain, change in bowel habits, or black or bloody stools Genito-Urinary ROS: negative for - genital discharge, genital ulcers, incontinence or abnormal  bleeding from genitals Musculoskeletal ROS: negative for - joint pain or muscle pain Neurological ROS: negative for - headaches or memory loss Dermatological ROS: negative for lumps, mole changes, rash and skin lesion changes  Past Medical History  Diagnosis Date  . Cervical dysplasia      Family History  Problem Relation Age of Onset  . Cancer Mother   . Heart failure Father      History  Substance Use Topics  . Smoking status: Former Smoker    Quit date: 01/14/2010  . Smokeless tobacco: Not on file  . Alcohol Use: 1.0 oz/week    2 drink(s) per week     Objective: Filed Vitals:   11/24/12 0822  BP: 137/76  Pulse: 72   General: No Acute Distress HEENT: Atraumatic, normocephalic, conjunctivae normal without scleral icterus.  No nasal discharge, hearing grossly intact, TMs with good landmarks bilaterally with no middle ear abnormalities, posterior pharynx clear without oral lesions. Neck: Supple, trachea midline, no cervical nor supraclavicular adenopathy. Pulmonary: Clear to auscultation bilaterally without wheezing, rhonchi, nor rales. Cardiac: Regular rate and rhythm.  No murmurs, rubs, nor gallops. No peripheral edema.  2+ peripheral pulses bilaterally. Abdomen: Bowel sounds normal.  No masses.  Non-tender without rebound.  Negative Murphy's sign. MSK: Grossly intact, no signs of weakness.  Full strength throughout upper and lower extremities.  Full ROM in upper and lower extremities.  No midline spinal tenderness. Neuro: Gait unremarkable, CN II-XII grossly intact.  C5-C6 Reflex 2/4 Bilaterally, L4 Reflex 2/4 Bilaterally.  Cerebellar function intact. Skin: No rashes. Psych: Alert and oriented to person/place/time.  Thought process normal. No anxiety/depression.  Assessment & Plan: Sue Smith was seen today for annual exam.  Diagnoses and associated orders for this visit:  Annual physical exam  Hyperlipidemia - Lipid Profile  Prediabetes - Hemoglobin  A1c  Essential hypertension, benign - BASIC METABOLIC PANEL WITH GFR  Screening for breast cancer - MM Digital Screening; Future  Osteoporosis, unspecified - DG Bone Density; Future  Fatigue - CBC  Low back pain - oxyCODONE-acetaminophen (PERCOCET/ROXICET) 5-325 MG per tablet; Take 1 tablet by mouth every 12 (twelve) hours as needed.    Healthy lifestyle interventions including but limited to regular exercise, a healthy low fat diet, moderation of salt intake, the dangers of tobacco/alcohol/recreational drug use, nutrition supplementation, and accident avoidance were discussed with the patient and a handout was provided for future reference. She is overdue for mammogram and DEXA scan orders have been placed Following A1c given history prediabetes, lipid panel for dyslipidemia, metabolic panel for hypertension. Screening for anemia given complaint of fatigue.    Return in about 3 months (around 02/24/2013).

## 2012-11-25 ENCOUNTER — Encounter: Payer: Self-pay | Admitting: Family Medicine

## 2012-11-25 ENCOUNTER — Telehealth: Payer: Self-pay | Admitting: Family Medicine

## 2012-11-25 DIAGNOSIS — M810 Age-related osteoporosis without current pathological fracture: Secondary | ICD-10-CM | POA: Insufficient documentation

## 2012-11-25 DIAGNOSIS — E785 Hyperlipidemia, unspecified: Secondary | ICD-10-CM | POA: Insufficient documentation

## 2012-11-25 MED ORDER — ALENDRONATE SODIUM 70 MG PO TABS: 70.0000 mg | ORAL_TABLET | ORAL | Status: DC

## 2012-11-25 MED ORDER — ATORVASTATIN CALCIUM 20 MG PO TABS: 20.0000 mg | ORAL_TABLET | Freq: Every day | ORAL | Status: DC

## 2012-11-25 NOTE — Telephone Encounter (Signed)
Pt.notified

## 2012-11-25 NOTE — Telephone Encounter (Signed)
Sue Lush, Will you please let mrs. Constantin know that her bone density test confirmed osteoporosis.  Her bone density is decreased to degree where I'd recommend she start a medication Fosamax/alendronate to help prevent further loss, this is a once a week tablet to take along with calcium and Vitamin D.  Also, cholesterol has increased over the past year to a degree where I'd also recommend starting lipitor/atorvastatin to help bring these down.  Blood sugar has not changed, not in the diabetic range and blood counts were normal.  I'd encourage f/u in 3 months to recheck cholesterol.

## 2012-12-01 ENCOUNTER — Other Ambulatory Visit: Payer: Self-pay | Admitting: Family Medicine

## 2012-12-01 DIAGNOSIS — R928 Other abnormal and inconclusive findings on diagnostic imaging of breast: Secondary | ICD-10-CM

## 2012-12-25 ENCOUNTER — Ambulatory Visit
Admission: RE | Admit: 2012-12-25 | Discharge: 2012-12-25 | Disposition: A | Discharge status: Home / Self Care | Payer: BC Managed Care – PPO | Source: Ambulatory Visit | Attending: Family Medicine | Admitting: Family Medicine

## 2012-12-25 DIAGNOSIS — R928 Other abnormal and inconclusive findings on diagnostic imaging of breast: Secondary | ICD-10-CM

## 2013-01-02 ENCOUNTER — Encounter: Payer: Self-pay | Admitting: Family Medicine

## 2013-01-02 DIAGNOSIS — M545 Low back pain, unspecified: Secondary | ICD-10-CM

## 2013-01-05 MED ORDER — OXYCODONE-ACETAMINOPHEN 5-325 MG PO TABS: 1.0000 | ORAL_TABLET | Freq: Two times a day (BID) | ORAL | Status: DC | PRN

## 2013-01-05 NOTE — Telephone Encounter (Signed)
Andrea, Rx placed in in-box ready for pickup/faxing.  

## 2013-02-02 ENCOUNTER — Encounter: Payer: Self-pay | Admitting: Family Medicine

## 2013-02-04 ENCOUNTER — Telehealth: Payer: Self-pay | Admitting: Family Medicine

## 2013-02-04 ENCOUNTER — Other Ambulatory Visit: Payer: Self-pay | Admitting: Family Medicine

## 2013-02-04 DIAGNOSIS — M545 Low back pain, unspecified: Secondary | ICD-10-CM

## 2013-02-04 MED ORDER — OXYCODONE-ACETAMINOPHEN 5-325 MG PO TABS: 1.0000 | ORAL_TABLET | Freq: Two times a day (BID) | ORAL | Status: DC | PRN

## 2013-02-04 NOTE — Telephone Encounter (Signed)
Pt came by and picked the rx up today

## 2013-02-04 NOTE — Telephone Encounter (Signed)
Sue Smith, Rx placed in in-box ready for pickup/faxing.  Good Morning Dr. Ileene Rubens, I would like to request a refill prescription for oxycodone-5-325. It has helped tremendously with the back pain especially during this cold weather. I will be seeing you late February for my cholesterol check. Thank you Sue Smith

## 2013-02-25 ENCOUNTER — Other Ambulatory Visit: Payer: Self-pay | Admitting: Family Medicine

## 2013-02-25 ENCOUNTER — Telehealth: Payer: Self-pay | Admitting: Family Medicine

## 2013-02-25 DIAGNOSIS — E785 Hyperlipidemia, unspecified: Secondary | ICD-10-CM

## 2013-02-25 DIAGNOSIS — M545 Low back pain, unspecified: Secondary | ICD-10-CM

## 2013-02-25 MED ORDER — OXYCODONE-ACETAMINOPHEN 5-325 MG PO TABS: 1.0000 | ORAL_TABLET | Freq: Two times a day (BID) | ORAL | Status: DC | PRN

## 2013-02-25 NOTE — Telephone Encounter (Signed)
Sue Smith, Rx requested by patient, in your inbox, can you please ask her to f/u in the next 2-4 weeks so we can go over some additional medications to help with her back pain so that she does not build a tolerance to oxycodone.

## 2013-02-25 NOTE — Telephone Encounter (Signed)
Pt notified; she asked if she can have chol lab order put in. Order placed

## 2013-03-23 ENCOUNTER — Encounter: Payer: Self-pay | Admitting: Family Medicine

## 2013-03-23 ENCOUNTER — Other Ambulatory Visit: Payer: Self-pay | Admitting: Family Medicine

## 2013-03-23 ENCOUNTER — Ambulatory Visit (INDEPENDENT_AMBULATORY_CARE_PROVIDER_SITE_OTHER): Payer: BC Managed Care – PPO | Admitting: Family Medicine

## 2013-03-23 VITALS — BP 142/75 | HR 73 | Wt 147.0 lb

## 2013-03-23 DIAGNOSIS — Z79899 Other long term (current) drug therapy: Secondary | ICD-10-CM

## 2013-03-23 DIAGNOSIS — M545 Low back pain, unspecified: Secondary | ICD-10-CM

## 2013-03-23 DIAGNOSIS — M810 Age-related osteoporosis without current pathological fracture: Secondary | ICD-10-CM

## 2013-03-23 DIAGNOSIS — M5136 Other intervertebral disc degeneration, lumbar region: Secondary | ICD-10-CM

## 2013-03-23 DIAGNOSIS — E785 Hyperlipidemia, unspecified: Secondary | ICD-10-CM

## 2013-03-23 DIAGNOSIS — Z5181 Encounter for therapeutic drug level monitoring: Secondary | ICD-10-CM

## 2013-03-23 DIAGNOSIS — M5137 Other intervertebral disc degeneration, lumbosacral region: Secondary | ICD-10-CM

## 2013-03-23 LAB — COMPLETE METABOLIC PANEL WITH GFR
ALT: 16 U/L (ref 0–35)
AST: 11 U/L (ref 0–37)
Albumin: 4.2 g/dL (ref 3.5–5.2)
Alkaline Phosphatase: 71 U/L (ref 39–117)
BILIRUBIN TOTAL: 0.4 mg/dL (ref 0.2–1.2)
BUN: 11 mg/dL (ref 6–23)
CO2: 29 meq/L (ref 19–32)
Calcium: 9.5 mg/dL (ref 8.4–10.5)
Chloride: 105 mEq/L (ref 96–112)
Creat: 0.91 mg/dL (ref 0.50–1.10)
GFR, EST AFRICAN AMERICAN: 82 mL/min
GFR, EST NON AFRICAN AMERICAN: 71 mL/min
GLUCOSE: 92 mg/dL (ref 70–99)
Potassium: 4.3 mEq/L (ref 3.5–5.3)
Sodium: 143 mEq/L (ref 135–145)
Total Protein: 6.8 g/dL (ref 6.0–8.3)

## 2013-03-23 LAB — LIPID PANEL
CHOL/HDL RATIO: 3.6 ratio
Cholesterol: 154 mg/dL (ref 0–200)
HDL: 43 mg/dL (ref >39)
LDL CALC: 92 mg/dL (ref 0–99)
Triglycerides: 97 mg/dL (ref <150)
VLDL: 19 mg/dL (ref 0–40)

## 2013-03-23 MED ORDER — OXYCODONE-ACETAMINOPHEN 5-325 MG PO TABS: 1.0000 | ORAL_TABLET | Freq: Two times a day (BID) | ORAL | Status: DC | PRN

## 2013-03-23 MED ORDER — CELECOXIB 200 MG PO CAPS: 200.0000 mg | ORAL_CAPSULE | Freq: Two times a day (BID) | ORAL | Status: DC

## 2013-03-23 NOTE — Progress Notes (Signed)
CC: Sue Smith is a 58 y.o. female is here for f/u back pain and cholesterol f/u   Subjective: HPI:  Followup hyperlipidemia: Continues on Lipitor a daily basis without myalgias nor right upper quadrant pain. She has been trying to eat better with a low-fat diet and try to stay more active during the day. She's lost 5 pounds since I saw her last this was intentional. Denies motor or sensory disturbances, chest pain, nor limb claudication  Followup low back pain: Patient states that half a tablet of her Percocet will completely relieve her pain for approximately 8-12 hours. Pain is still localized in the middle of the low back is nonradiating worse with leaning backwards greater than leaning forwards. No saddle paresthesia nor bowel or bladder incontinence. Character severity and frequency of pain has not been deteriorating  Followup osteoporosis: Continues to take Fosamax on a weekly basis without dysphagia or any known side effects. She's also taking vitamin D and calcium.  Denies any new joint or bone pain.     Review Of Systems Outlined In HPI  Past Medical History  Diagnosis Date  . Cervical dysplasia     Past Surgical History  Procedure Laterality Date  . Abdominal hysterectomy    . Total abdominal hysterectomy w/ bilateral salpingoophorectomy    . Appendectomy     Family History  Problem Relation Age of Onset  . Cancer Mother   . Heart failure Father     History   Social History  . Marital Status: Widowed    Spouse Name: N/A    Number of Children: N/A  . Years of Education: N/A   Occupational History  . Not on file.   Social History Main Topics  . Smoking status: Former Smoker    Quit date: 01/14/2010  . Smokeless tobacco: Not on file  . Alcohol Use: 1.0 oz/week    2 drink(s) per week  . Drug Use: No  . Sexual Activity: Not on file   Other Topics Concern  . Not on file   Social History Narrative  . No narrative on file     Objective: BP 142/75   Pulse 73  Wt 147 lb (66.679 kg)  Vital signs reviewed. General: Alert and Oriented, No Acute Distress HEENT: Pupils equal, round, reactive to light. Conjunctivae clear.  External ears unremarkable.  Moist mucous membranes. Lungs: Clear and comfortable work of breathing, speaking in full sentences without accessory muscle use. Cardiac: Regular rate and rhythm.  Back: Full range of motion without restrictions of the lumbar spine Extremities: No peripheral edema.  Strong peripheral pulses.  Mental Status: No depression, anxiety, nor agitation. Logical though process. Skin: Warm and dry.  Assessment & Plan: Sue Smith was seen today for f/u back pain and cholesterol f/u.  Diagnoses and associated orders for this visit:  Hyperlipidemia - Lipid Profile  Encounter for monitoring statin therapy - COMPLETE METABOLIC PANEL WITH GFR  DDD (degenerative disc disease), lumbar  Low back pain - oxyCODONE-acetaminophen (PERCOCET/ROXICET) 5-325 MG per tablet; Take 1 tablet by mouth every 12 (twelve) hours as needed.  Osteoporosis  Other Orders - celecoxib (CELEBREX) 200 MG capsule; Take 1 capsule (200 mg total) by mouth 2 (two) times daily.    Hyperlipidemia: Due for repeat LDL checked checking liver enzymes Degenerative disc disease and low back pain: Stable and controlled continue as needed Percocet we have added Celebrex via samples to see this limits her need for narcotics Osteoporosis: Clinically stable continue Fosamax  Return in  about 6 months (around 09/23/2013).

## 2013-03-24 ENCOUNTER — Telehealth: Payer: Self-pay | Admitting: Family Medicine

## 2013-03-24 DIAGNOSIS — E785 Hyperlipidemia, unspecified: Secondary | ICD-10-CM

## 2013-03-24 MED ORDER — ATORVASTATIN CALCIUM 20 MG PO TABS: 20.0000 mg | ORAL_TABLET | Freq: Every day | ORAL | Status: DC

## 2013-03-24 NOTE — Telephone Encounter (Signed)
lipitor refill

## 2013-03-25 ENCOUNTER — Encounter: Payer: Self-pay | Admitting: Family Medicine

## 2013-03-30 ENCOUNTER — Encounter: Payer: Self-pay | Admitting: Family Medicine

## 2013-03-31 ENCOUNTER — Telehealth: Payer: Self-pay | Admitting: Family Medicine

## 2013-03-31 DIAGNOSIS — M1990 Unspecified osteoarthritis, unspecified site: Secondary | ICD-10-CM

## 2013-03-31 MED ORDER — CELECOXIB 200 MG PO CAPS: 200.0000 mg | ORAL_CAPSULE | Freq: Two times a day (BID) | ORAL | Status: DC

## 2013-03-31 NOTE — Telephone Encounter (Signed)
See email--

## 2013-04-13 ENCOUNTER — Other Ambulatory Visit: Payer: Self-pay | Admitting: Family Medicine

## 2013-04-27 ENCOUNTER — Telehealth: Payer: Self-pay | Admitting: Family Medicine

## 2013-04-27 DIAGNOSIS — M545 Low back pain, unspecified: Secondary | ICD-10-CM

## 2013-04-28 MED ORDER — OXYCODONE-ACETAMINOPHEN 5-325 MG PO TABS: 1.0000 | ORAL_TABLET | Freq: Two times a day (BID) | ORAL | Status: DC | PRN

## 2013-04-28 NOTE — Telephone Encounter (Signed)
rx up front and left message on pt's vm that  rx up front

## 2013-04-28 NOTE — Telephone Encounter (Signed)
Sue Smith, Rx placed in in-box ready for pickup/faxing.  

## 2013-05-27 ENCOUNTER — Other Ambulatory Visit: Payer: Self-pay | Admitting: Family Medicine

## 2013-05-27 ENCOUNTER — Telehealth: Payer: Self-pay | Admitting: Family Medicine

## 2013-05-27 DIAGNOSIS — M545 Low back pain, unspecified: Secondary | ICD-10-CM

## 2013-05-27 MED ORDER — OXYCODONE-ACETAMINOPHEN 5-325 MG PO TABS: 1.0000 | ORAL_TABLET | Freq: Two times a day (BID) | ORAL | Status: DC | PRN

## 2013-05-27 NOTE — Telephone Encounter (Signed)
rx up front 

## 2013-05-27 NOTE — Telephone Encounter (Signed)
Sue Smith, Rx placed in in-box ready for pickup/faxing.  

## 2013-06-25 ENCOUNTER — Telehealth: Payer: Self-pay | Admitting: Family Medicine

## 2013-06-25 DIAGNOSIS — M545 Low back pain, unspecified: Secondary | ICD-10-CM

## 2013-06-28 ENCOUNTER — Other Ambulatory Visit: Payer: Self-pay | Admitting: Family Medicine

## 2013-06-28 MED ORDER — OXYCODONE-ACETAMINOPHEN 5-325 MG PO TABS: 1.0000 | ORAL_TABLET | Freq: Two times a day (BID) | ORAL | Status: DC | PRN

## 2013-06-28 NOTE — Telephone Encounter (Signed)
Sue Smith, Rx placed in in-box ready for pickup/faxing.  

## 2013-06-28 NOTE — Telephone Encounter (Signed)
rx up front 

## 2013-07-14 ENCOUNTER — Encounter: Payer: Self-pay | Admitting: Physician Assistant

## 2013-07-14 ENCOUNTER — Ambulatory Visit (INDEPENDENT_AMBULATORY_CARE_PROVIDER_SITE_OTHER): Payer: BC Managed Care – PPO | Admitting: Physician Assistant

## 2013-07-14 VITALS — BP 138/88 | HR 82 | Temp 97.9°F | Ht 67.0 in | Wt 147.0 lb

## 2013-07-14 DIAGNOSIS — J209 Acute bronchitis, unspecified: Secondary | ICD-10-CM

## 2013-07-14 MED ORDER — PREDNISONE 50 MG PO TABS: ORAL_TABLET | ORAL | Status: DC

## 2013-07-14 MED ORDER — LEVOFLOXACIN 500 MG PO TABS: 500.0000 mg | ORAL_TABLET | Freq: Every day | ORAL | Status: DC

## 2013-07-14 NOTE — Patient Instructions (Addendum)
Prednisone for 5 days.  levaquin for 7 days.    Bronchitis Bronchitis is inflammation of the airways that extend from the windpipe into the lungs (bronchi). The inflammation often causes mucus to develop, which leads to a cough. If the inflammation becomes severe, it may cause shortness of breath. CAUSES  Bronchitis may be caused by:   Viral infections.   Bacteria.   Cigarette smoke.   Allergens, pollutants, and other irritants.  SIGNS AND SYMPTOMS  The most common symptom of bronchitis is a frequent cough that produces mucus. Other symptoms include:  Fever.   Body aches.   Chest congestion.   Chills.   Shortness of breath.   Sore throat.  DIAGNOSIS  Bronchitis is usually diagnosed through a medical history and physical exam. Tests, such as chest X-rays, are sometimes done to rule out other conditions.  TREATMENT  You may need to avoid contact with whatever caused the problem (smoking, for example). Medicines are sometimes needed. These may include:  Antibiotics. These may be prescribed if the condition is caused by bacteria.  Cough suppressants. These may be prescribed for relief of cough symptoms.   Inhaled medicines. These may be prescribed to help open your airways and make it easier for you to breathe.   Steroid medicines. These may be prescribed for those with recurrent (chronic) bronchitis. HOME CARE INSTRUCTIONS  Get plenty of rest.   Drink enough fluids to keep your urine clear or pale yellow (unless you have a medical condition that requires fluid restriction). Increasing fluids may help thin your secretions and will prevent dehydration.   Only take over-the-counter or prescription medicines as directed by your health care provider.  Only take antibiotics as directed. Make sure you finish them even if you start to feel better.  Avoid secondhand smoke, irritating chemicals, and strong fumes. These will make bronchitis worse. If you are a  smoker, quit smoking. Consider using nicotine gum or skin patches to help control withdrawal symptoms. Quitting smoking will help your lungs heal faster.   Put a cool-mist humidifier in your bedroom at night to moisten the air. This may help loosen mucus. Change the water in the humidifier daily. You can also run the hot water in your shower and sit in the bathroom with the door closed for 5-10 minutes.   Follow up with your health care provider as directed.   Wash your hands frequently to avoid catching bronchitis again or spreading an infection to others.  SEEK MEDICAL CARE IF: Your symptoms do not improve after 1 week of treatment.  SEEK IMMEDIATE MEDICAL CARE IF:  Your fever increases.  You have chills.   You have chest pain.   You have worsening shortness of breath.   You have bloody sputum.  You faint.  You have lightheadedness.  You have a severe headache.   You vomit repeatedly. MAKE SURE YOU:   Understand these instructions.  Will watch your condition.  Will get help right away if you are not doing well or get worse. Document Released: 12/31/2004 Document Revised: 10/21/2012 Document Reviewed: 08/25/2012 [facility] Patient Information 2015 [place], [place]. This information is not intended to replace advice given to you by your health care provider. Make sure you discuss any questions you have with your health care provider.

## 2013-07-16 NOTE — Progress Notes (Signed)
   Subjective:    Patient ID: Sue Smith, female    DOB: May 03, 1955, 58 y.o.   MRN: [id number]  HPI Pt is a 58 yo female who presents to the clinic with productive cough , chest tightness and wheezing for about one month. She has treated with OTC mucinex and nyquil with no relief. She continues to have a lot of mucinex production. Mostly white to clear but some yellowish sputum. No fever, chills, nausea, vomiting, sinus pressure, ear pain or ST. She is a former smoker who quit in 2012. Pt has nebulizer machine access but has not been using.    Review of Systems  All other systems reviewed and are negative.      Objective:   Physical Exam  Constitutional: She is oriented to person, place, and time. She appears well-developed and well-nourished.  HENT:  Head: Normocephalic and atraumatic.  Right Ear: External ear normal.  Left Ear: External ear normal.  Nose: Nose normal.  Mouth/Throat: Oropharynx is clear and moist.  TM's clear bilaterally  Negative maxillary and frontal sinus tenderness.   Eyes: Conjunctivae are normal.  Neck: Normal range of motion. Neck supple.  Cardiovascular: Normal rate, regular rhythm and normal heart sounds.   Pulmonary/Chest:  Bilateral wheezing throughout both lungs. Pulse ox 95 percent.   Lymphadenopathy:    She has no cervical adenopathy.  Neurological: She is alert and oriented to person, place, and time.  Skin: Skin is dry.  Psychiatric: She has a normal mood and affect. Her behavior is normal.          Assessment & Plan:  Acute bronchitis- duoneb was given in office today. Pt did have some benefit espeically with more mucus production. Pulse ox is reassuring.  pt has nebuilzer at home with albuterol. Suggested to use every 2-6 hours as needed for SOB and wheezing. Use more for next 3 days. Prednisone given today. levaquin for 7 days given. Pt has numerous allergies and intolerances. She requested to be given last abx that she was given  since she tolerated so well. HO given. Offered cough syrup; however she declined today. She does not like narcotic in cough syrup.   Discussed if coughing continued spirometry would be a good next step.

## 2013-07-19 ENCOUNTER — Encounter: Payer: Self-pay | Admitting: Family Medicine

## 2013-07-21 ENCOUNTER — Ambulatory Visit (INDEPENDENT_AMBULATORY_CARE_PROVIDER_SITE_OTHER): Payer: BC Managed Care – PPO | Admitting: Family Medicine

## 2013-07-21 ENCOUNTER — Encounter: Payer: Self-pay | Admitting: Family Medicine

## 2013-07-21 VITALS — BP 108/50 | HR 59 | Ht 67.0 in | Wt 147.0 lb

## 2013-07-21 DIAGNOSIS — B9689 Other specified bacterial agents as the cause of diseases classified elsewhere: Secondary | ICD-10-CM

## 2013-07-21 DIAGNOSIS — M549 Dorsalgia, unspecified: Secondary | ICD-10-CM

## 2013-07-21 DIAGNOSIS — G8929 Other chronic pain: Secondary | ICD-10-CM

## 2013-07-21 DIAGNOSIS — I10 Essential (primary) hypertension: Secondary | ICD-10-CM

## 2013-07-21 DIAGNOSIS — A499 Bacterial infection, unspecified: Secondary | ICD-10-CM

## 2013-07-21 DIAGNOSIS — J329 Chronic sinusitis, unspecified: Secondary | ICD-10-CM

## 2013-07-21 DIAGNOSIS — M545 Low back pain, unspecified: Secondary | ICD-10-CM

## 2013-07-21 MED ORDER — PREDNISONE 20 MG PO TABS: ORAL_TABLET | ORAL | Status: AC

## 2013-07-21 MED ORDER — OXYCODONE-ACETAMINOPHEN 5-325 MG PO TABS: 1.0000 | ORAL_TABLET | Freq: Two times a day (BID) | ORAL | Status: DC | PRN

## 2013-07-21 MED ORDER — AMOXICILLIN-POT CLAVULANATE 500-125 MG PO TABS: ORAL_TABLET | ORAL | Status: AC

## 2013-07-21 NOTE — Progress Notes (Signed)
CC: Sue Smith is a 58 y.o. female is here for Cough   Subjective: HPI:  Patient reports facial pain localized in the right cheek described as pressure mild to moderate severity accompanied by dry cough, sore throat, fatigue, subjective fever and chills that has been present for the past 2 weeks. Symptoms were company by a nonproductive cough with wheezing however this is slightly improved after 5 days of prednisone and 7 days of Levaquin. She continues to use albuterol nebulizer treatment twice a day without any benefit of the facial pain or sore throat symptoms. Review of systems is positive for nasal congestion. She denies motor or sensory disturbances, lightheadedness, headache,. Review of systems is positive for rash on the right and left flanks that came on 2 days ago and is itchy.  She states that she is going to file for Social Security disability and would like to know what she needs to do to document her physical limitations due to chronic back pain degenerative disc disease.  Followup hypertension: No outside blood pressures to report she continues on lisinopril on a daily basis denying any side effects or intolerance.   Review Of Systems Outlined In HPI  Past Medical History  Diagnosis Date  . Cervical dysplasia     Past Surgical History  Procedure Laterality Date  . Abdominal hysterectomy    . Total abdominal hysterectomy w/ bilateral salpingoophorectomy    . Appendectomy     Family History  Problem Relation Age of Onset  . Cancer Mother   . Heart failure Father     History   Social History  . Marital Status: Widowed    Spouse Name: N/A    Number of Children: N/A  . Years of Education: N/A   Occupational History  . Not on file.   Social History Main Topics  . Smoking status: Former Smoker    Quit date: 01/14/2010  . Smokeless tobacco: Not on file  . Alcohol Use: 1.0 oz/week    2 drink(s) per week  . Drug Use: No  . Sexual Activity: Not on file    Other Topics Concern  . Not on file   Social History Narrative  . No narrative on file     Objective: BP 108/50  Pulse 59  Ht 5\' 7"  (1.702 m)  Wt 147 lb (66.679 kg)  BMI 23.02 kg/m2  General: Alert and Oriented, No Acute Distress HEENT: Pupils equal, round, reactive to light. Conjunctivae clear.  External ears unremarkable, canals clear with intact TMs with appropriate landmarks.  Middle ear appears open without effusion. Pink inferior turbinates.  Moist mucous membranes, pharynx with moderate inflammation however no lesions, uvula is midline.  Neck with shotty right and left anterior cervical chain lymphadenopathy but no posterior chain lymphadenopathy.  Lungs: Clear to auscultation bilaterally, no wheezing/ronchi/rales.  Comfortable work of breathing. Good air movement. Cardiac: Regular rate and rhythm. Normal S1/S2.  No murmurs, rubs, nor gallops.   Extremities: No peripheral edema.  Strong peripheral pulses.  Mental Status: No depression, anxiety, nor agitation. Skin: Warm and dry. 3 welts on the right flank and again on the left flank with mild surrounding excoriations, not tender to the touch  Assessment & Plan: Sue Smith was seen today for cough.  Diagnoses and associated orders for this visit:  Bacterial sinusitis - predniSONE (DELTASONE) 20 MG tablet; Three tabs daily days 1-3, two tabs daily days 4-6, one tab daily days 7-9, half tab daily days 10-13. - amoxicillin-clavulanate (AUGMENTIN) 500-125 MG  per tablet; Take one by mouth every 8 hours for ten total days.  Chronic back pain - Ambulatory referral to Physical Therapy  Low back pain without sciatica, unspecified back pain laterality - oxyCODONE-acetaminophen (PERCOCET/ROXICET) 5-325 MG per tablet; Take 1 tablet by mouth every 12 (twelve) hours as needed.  Essential hypertension    Bacterial sinusitis: Start prednisone taper and Augmentin Chronic back pain: I referred her to physical therapy for a functional  capacity evaluation not necessarily for recurring visits Low back pain: She's due for refill on oxycodone which has been providing her with satisfactory pain improvement Essential hypertension: Controlled continue lisinopril   Return if symptoms worsen or fail to improve.

## 2013-07-22 ENCOUNTER — Encounter: Payer: Self-pay | Admitting: Family Medicine

## 2013-07-22 ENCOUNTER — Telehealth: Payer: Self-pay | Admitting: Family Medicine

## 2013-07-22 DIAGNOSIS — M5136 Other intervertebral disc degeneration, lumbar region: Secondary | ICD-10-CM

## 2013-07-22 NOTE — Telephone Encounter (Signed)
CEA no regarding functional capacity evaluation

## 2013-08-02 ENCOUNTER — Encounter: Payer: Self-pay | Admitting: Family Medicine

## 2013-08-17 ENCOUNTER — Ambulatory Visit: Payer: BC Managed Care – PPO | Attending: Family Medicine

## 2013-08-17 DIAGNOSIS — M5137 Other intervertebral disc degeneration, lumbosacral region: Secondary | ICD-10-CM | POA: Diagnosis not present

## 2013-08-17 DIAGNOSIS — M51379 Other intervertebral disc degeneration, lumbosacral region without mention of lumbar back pain or lower extremity pain: Secondary | ICD-10-CM | POA: Insufficient documentation

## 2013-08-17 DIAGNOSIS — IMO0001 Reserved for inherently not codable concepts without codable children: Secondary | ICD-10-CM | POA: Diagnosis present

## 2013-08-19 ENCOUNTER — Encounter: Payer: Self-pay | Admitting: Family Medicine

## 2013-08-19 ENCOUNTER — Telehealth: Payer: Self-pay | Admitting: Family Medicine

## 2013-08-19 DIAGNOSIS — M5136 Other intervertebral disc degeneration, lumbar region: Secondary | ICD-10-CM

## 2013-08-19 NOTE — Telephone Encounter (Signed)
Seth Bake, Will you please asked patient if she was provided with results from her functional capacity evaluation. This evaluation classify her as only being capable of performing light work 8 hours a day for 40 hours a week. Having this document is important she desires to claim or seek disability benefits. I like her to have a copy of this for records, please let me know she does not have a copy and will request one from Pearson Forster, PT

## 2013-08-19 NOTE — Telephone Encounter (Signed)
Pt notified. [facility] is faxing over functional capacity eval results

## 2013-08-22 ENCOUNTER — Other Ambulatory Visit: Payer: Self-pay | Admitting: Family Medicine

## 2013-08-22 DIAGNOSIS — M545 Low back pain: Secondary | ICD-10-CM

## 2013-08-23 ENCOUNTER — Encounter: Payer: Self-pay | Admitting: Family Medicine

## 2013-08-23 ENCOUNTER — Telehealth: Payer: Self-pay | Admitting: *Deleted

## 2013-08-23 ENCOUNTER — Ambulatory Visit (INDEPENDENT_AMBULATORY_CARE_PROVIDER_SITE_OTHER): Payer: BC Managed Care – PPO | Admitting: Family Medicine

## 2013-08-23 VITALS — BP 136/84 | HR 85 | Temp 98.1°F | Wt 148.0 lb

## 2013-08-23 DIAGNOSIS — R300 Dysuria: Secondary | ICD-10-CM

## 2013-08-23 DIAGNOSIS — M545 Low back pain, unspecified: Secondary | ICD-10-CM

## 2013-08-23 LAB — POCT URINALYSIS DIPSTICK
Bilirubin, UA: NEGATIVE
Glucose, UA: NEGATIVE
NITRITE UA: NEGATIVE
Protein, UA: 30
Spec Grav, UA: 1.015
UROBILINOGEN UA: 0.2
pH, UA: 8.5

## 2013-08-23 MED ORDER — OXYCODONE-ACETAMINOPHEN 5-325 MG PO TABS: 1.0000 | ORAL_TABLET | Freq: Two times a day (BID) | ORAL | Status: DC | PRN

## 2013-08-23 MED ORDER — CIPROFLOXACIN HCL 250 MG PO TABS: ORAL_TABLET | ORAL | Status: AC

## 2013-08-23 NOTE — Telephone Encounter (Signed)
Sue Smith sent a msg thru [facility] and I responded to her msg and also left a voice mail on her phone. I stated that if she felt she had another UTI she needed to schedule a nurse visit or appt with her provider. Margette Fast, CMA

## 2013-08-23 NOTE — Progress Notes (Signed)
CC: [name] is a 58 y.o. female is here for Dysuria   Subjective: HPI:  Complains of dysuria described as a burning when urinating and a knife sensation in her bladder upon completion of urinating. Symptoms are moderate in severity and have been worsening since onset 3 days ago. No interventions as of yet. Denies fevers, chills, nausea, vomiting, nor any other genitourinary complaints   Review Of Systems Outlined In HPI  Past Medical History  Diagnosis Date  . Cervical dysplasia     Past Surgical History  Procedure Laterality Date  . Abdominal hysterectomy    . Total abdominal hysterectomy w/ bilateral salpingoophorectomy    . Appendectomy     Family History  Problem Relation Age of Onset  . Cancer Mother   . Heart failure Father     History   Social History  . Marital Status: Widowed    Spouse Name: N/A    Number of Children: N/A  . Years of Education: N/A   Occupational History  . Not on file.   Social History Main Topics  . Smoking status: Former Smoker    Quit date: 01/14/2010  . Smokeless tobacco: Not on file  . Alcohol Use: 1.0 oz/week    2 drink(s) per week  . Drug Use: No  . Sexual Activity: Not on file   Other Topics Concern  . Not on file   Social History Narrative  . No narrative on file     Objective: BP 136/84  Pulse 85  Temp(Src) 98.1 F (36.7 C) (Oral)  Wt 148 lb (67.132 kg)  Vital signs reviewed. General: Alert and Oriented, No Acute Distress HEENT: Pupils equal, round, reactive to light. Conjunctivae clear.  External ears unremarkable.  Moist mucous membranes. Lungs: Clear and comfortable work of breathing, speaking in full sentences without accessory muscle use. Cardiac: Regular rate and rhythm.  Neuro: CN II-XII grossly intact, gait normal. Extremities: No peripheral edema.  Strong peripheral pulses.  Mental Status: No depression, anxiety, nor agitation. Logical though process. Skin: Warm and dry.  Assessment &  Plan: Airis was seen today for dysuria.  Diagnoses and associated orders for this visit:  Dysuria - Urinalysis Dipstick - Urine Culture - ciprofloxacin (CIPRO) 250 MG tablet; Take one by mouth twice a day for five days.  Low back pain without sciatica, unspecified back pain laterality - oxyCODONE-acetaminophen (PERCOCET/ROXICET) 5-325 MG per tablet; Take 1 tablet by mouth every 12 (twelve) hours as needed.    Dysuria: Start ciprofloxacin based on antibiotic sensitivities from last year, will follow culture Low back pain: She's due for refill on her oxycodone  Followup on an as-needed basis   No Follow-up on file.

## 2013-08-25 LAB — URINE CULTURE: Colony Count: >=100000

## 2013-09-11 ENCOUNTER — Other Ambulatory Visit: Payer: Self-pay | Admitting: Family Medicine

## 2013-09-20 ENCOUNTER — Telehealth: Payer: Self-pay | Admitting: Family Medicine

## 2013-09-20 DIAGNOSIS — M545 Low back pain: Secondary | ICD-10-CM

## 2013-09-21 MED ORDER — OXYCODONE-ACETAMINOPHEN 5-325 MG PO TABS: 1.0000 | ORAL_TABLET | Freq: Two times a day (BID) | ORAL | Status: DC | PRN

## 2013-09-21 NOTE — Telephone Encounter (Signed)
Sue Smith, Rx placed in in-box ready for pickup/faxing.  

## 2013-09-22 NOTE — Telephone Encounter (Signed)
rx placed up front and pt notified

## 2013-09-24 ENCOUNTER — Other Ambulatory Visit: Payer: Self-pay | Admitting: Family Medicine

## 2013-10-22 ENCOUNTER — Telehealth: Payer: Self-pay | Admitting: Family Medicine

## 2013-10-22 DIAGNOSIS — M545 Low back pain: Secondary | ICD-10-CM

## 2013-10-22 MED ORDER — OXYCODONE-ACETAMINOPHEN 5-325 MG PO TABS: 1.0000 | ORAL_TABLET | Freq: Two times a day (BID) | ORAL | Status: DC | PRN

## 2013-10-22 NOTE — Telephone Encounter (Signed)
Printed and in Dr [employer] in box.

## 2013-11-14 ENCOUNTER — Other Ambulatory Visit: Payer: Self-pay | Admitting: Family Medicine

## 2013-11-16 ENCOUNTER — Other Ambulatory Visit: Payer: Self-pay | Admitting: Family Medicine

## 2013-11-21 ENCOUNTER — Telehealth: Payer: Self-pay | Admitting: Family Medicine

## 2013-11-21 DIAGNOSIS — M545 Low back pain: Secondary | ICD-10-CM

## 2013-11-22 MED ORDER — OXYCODONE-ACETAMINOPHEN 5-325 MG PO TABS: 1.0000 | ORAL_TABLET | Freq: Two times a day (BID) | ORAL | Status: DC | PRN

## 2013-11-22 NOTE — Telephone Encounter (Signed)
rx placed up front by tammy

## 2013-11-22 NOTE — Telephone Encounter (Signed)
Andrea, Rx placed in in-box ready for pickup/faxing.  

## 2013-11-25 ENCOUNTER — Ambulatory Visit (INDEPENDENT_AMBULATORY_CARE_PROVIDER_SITE_OTHER): Payer: BC Managed Care – PPO | Admitting: Family Medicine

## 2013-11-25 ENCOUNTER — Ambulatory Visit: Payer: BC Managed Care – PPO

## 2013-11-25 ENCOUNTER — Encounter: Payer: Self-pay | Admitting: Family Medicine

## 2013-11-25 VITALS — BP 131/78 | HR 77 | Ht 67.0 in | Wt 144.0 lb

## 2013-11-25 DIAGNOSIS — R7303 Prediabetes: Secondary | ICD-10-CM

## 2013-11-25 DIAGNOSIS — M810 Age-related osteoporosis without current pathological fracture: Secondary | ICD-10-CM

## 2013-11-25 DIAGNOSIS — M545 Low back pain: Secondary | ICD-10-CM

## 2013-11-25 DIAGNOSIS — G4700 Insomnia, unspecified: Secondary | ICD-10-CM | POA: Insufficient documentation

## 2013-11-25 DIAGNOSIS — M1990 Unspecified osteoarthritis, unspecified site: Secondary | ICD-10-CM

## 2013-11-25 DIAGNOSIS — Z0000 Encounter for general adult medical examination without abnormal findings: Secondary | ICD-10-CM

## 2013-11-25 DIAGNOSIS — Z1239 Encounter for other screening for malignant neoplasm of breast: Secondary | ICD-10-CM

## 2013-11-25 DIAGNOSIS — E785 Hyperlipidemia, unspecified: Secondary | ICD-10-CM

## 2013-11-25 LAB — CBC
HCT: 46.2 % — ABNORMAL HIGH (ref 36.0–46.0)
HEMOGLOBIN: 15.3 g/dL — AB (ref 12.0–15.0)
MCH: 31.6 pg (ref 26.0–34.0)
MCHC: 33.1 g/dL (ref 30.0–36.0)
MCV: 95.5 fL (ref 78.0–100.0)
Platelets: 208 10*3/uL (ref 150–400)
RBC: 4.84 MIL/uL (ref 3.87–5.11)
RDW: 12.8 % (ref 11.5–15.5)
WBC: 6.4 10*3/uL (ref 4.0–10.5)

## 2013-11-25 LAB — COMPLETE METABOLIC PANEL WITH GFR
ALBUMIN: 4.5 g/dL (ref 3.5–5.2)
ALT: 10 U/L (ref 0–35)
AST: 11 U/L (ref 0–37)
Alkaline Phosphatase: 67 U/L (ref 39–117)
BILIRUBIN TOTAL: 0.4 mg/dL (ref 0.2–1.2)
BUN: 10 mg/dL (ref 6–23)
CHLORIDE: 103 meq/L (ref 96–112)
CO2: 29 meq/L (ref 19–32)
Calcium: 10.3 mg/dL (ref 8.4–10.5)
Creat: 0.96 mg/dL (ref 0.50–1.10)
GFR, EST AFRICAN AMERICAN: 76 mL/min
GFR, Est Non African American: 66 mL/min
Glucose, Bld: 103 mg/dL — ABNORMAL HIGH (ref 70–99)
POTASSIUM: 4.2 meq/L (ref 3.5–5.3)
SODIUM: 141 meq/L (ref 135–145)
TOTAL PROTEIN: 7.2 g/dL (ref 6.0–8.3)

## 2013-11-25 LAB — LIPID PANEL
Cholesterol: 152 mg/dL (ref 0–200)
HDL: 54 mg/dL (ref >39)
LDL CALC: 77 mg/dL (ref 0–99)
Total CHOL/HDL Ratio: 2.8 Ratio
Triglycerides: 105 mg/dL (ref <150)
VLDL: 21 mg/dL (ref 0–40)

## 2013-11-25 MED ORDER — CELECOXIB 200 MG PO CAPS: 200.0000 mg | ORAL_CAPSULE | Freq: Two times a day (BID) | ORAL | Status: DC

## 2013-11-25 MED ORDER — AMITRIPTYLINE HCL 10 MG PO TABS: 10.0000 mg | ORAL_TABLET | Freq: Every evening | ORAL | Status: DC | PRN

## 2013-11-25 MED ORDER — LISINOPRIL 20 MG PO TABS: ORAL_TABLET | ORAL | Status: DC

## 2013-11-25 MED ORDER — ALENDRONATE SODIUM 70 MG PO TABS: 70.0000 mg | ORAL_TABLET | ORAL | Status: DC

## 2013-11-25 MED ORDER — OXYCODONE-ACETAMINOPHEN 5-325 MG PO TABS: 1.0000 | ORAL_TABLET | Freq: Two times a day (BID) | ORAL | Status: DC | PRN

## 2013-11-25 MED ORDER — ESTRADIOL 0.5 MG PO TABS
ORAL_TABLET | ORAL | Status: DC
Start: 2013-11-25 — End: 2014-01-19

## 2013-11-25 MED ORDER — RANITIDINE HCL 150 MG PO CAPS: 150.0000 mg | ORAL_CAPSULE | Freq: Every evening | ORAL | Status: DC

## 2013-11-25 NOTE — Progress Notes (Signed)
CC: Sue Smith is a 58 y.o. female is here for Annual Exam   Subjective: HPI:  Colonoscopy: Repeat due June 2019 Papsmear: Total hysterectomy and has had 5 negative Pap smears since, not indicated today. Mammogram: Repeat due December 2015  DEXA: On therapy and repeat needed one year.  Influenza Vaccine: she will receive this today Pneumovax: Pneumovax 2009, UTD Td/Tdap: Tdap 2009, UTD Zoster: (Start 58 yo)  Patient plans to continued hot flashes occurring on a daily basis mild to moderate severity. She also reports insomnia described as difficulty staying asleep despite using melatonin. This occurs on a daily basis. Complains of acidic sensation in the back of her throat that radiates up and down behind the sternum if she eats large meals before going to bed or chili dogs, this only happens late in the evening after eating. Denies any exertional component to this  Review of Systems - General ROS: negative for - chills, fever, night sweats, weight gain or weight loss Ophthalmic ROS: negative for - decreased vision Psychological ROS: negative for - anxiety or depression ENT ROS: negative for - hearing change, nasal congestion, tinnitus or allergies Hematological and Lymphatic ROS: negative for - bleeding problems, bruising or swollen lymph nodes Breast ROS: negative Respiratory ROS: no cough, shortness of breath, or wheezing Cardiovascular ROS: no chest pain or dyspnea on exertion Gastrointestinal ROS: no abdominal painother than that described above, change in bowel habits, or black or bloody stools Genito-Urinary ROS: negative for - genital discharge, genital ulcers, incontinence or abnormal bleeding from genitals Musculoskeletal ROS: negative for - joint pain or muscle pain Neurological ROS: negative for - headaches or memory loss Dermatological ROS: negative for lumps, mole changes, rash and skin lesion changes  Past Medical History  Diagnosis Date  . Cervical dysplasia      Past Surgical History  Procedure Laterality Date  . Abdominal hysterectomy    . Total abdominal hysterectomy w/ bilateral salpingoophorectomy    . Appendectomy     Family History  Problem Relation Age of Onset  . Cancer Mother   . Heart failure Father     History   Social History  . Marital Status: Widowed    Spouse Name: N/A    Number of Children: N/A  . Years of Education: N/A   Occupational History  . Not on file.   Social History Main Topics  . Smoking status: Former Smoker    Quit date: 01/14/2010  . Smokeless tobacco: Not on file  . Alcohol Use: 1.0 oz/week    2 drink(s) per week  . Drug Use: No  . Sexual Activity: Not on file   Other Topics Concern  . Not on file   Social History Narrative     Objective: BP 131/78 mmHg  Pulse 77  Ht 5\' 7"  (1.702 m)  Wt 144 lb (65.318 kg)  BMI 22.55 kg/m2  General: No Acute Distress HEENT: Atraumatic, normocephalic, conjunctivae normal without scleral icterus.  No nasal discharge, hearing grossly intact, TMs with good landmarks bilaterally with no middle ear abnormalities, posterior pharynx clear without oral lesions. Neck: Supple, trachea midline, no cervical nor supraclavicular adenopathy. Pulmonary: Clear to auscultation bilaterally without wheezing, rhonchi, nor rales. Cardiac: Regular rate and rhythm.  No murmurs, rubs, nor gallops. No peripheral edema.  2+ peripheral pulses bilaterally. Abdomen: Bowel sounds normal.  No masses.  Non-tender without rebound.  Negative Murphy's sign. GU: declined MSK: Grossly intact, no signs of weakness.  Full strength throughout upper and lower extremities.  Full ROM in upper and lower extremities.  No midline spinal tenderness. Neuro: Gait unremarkable, CN II-XII grossly intact.  C5-C6 Reflex 2/4 Bilaterally, L4 Reflex 2/4 Bilaterally.  Cerebellar function intact. Skin: No rashes. Seborrheic keratoses on the left calf noninflamed Psych: Alert and oriented to person/place/time.   Thought process normal. No anxiety/depression.   Assessment & Plan: Sue Smith was seen today for annual exam.  Diagnoses and associated orders for this visit:  Pre-diabetes - Hemoglobin A1c  Hyperlipidemia - COMPLETE METABOLIC PANEL WITH GFR  Screening for breast cancer - MM DIGITAL SCREENING BILATERAL; Future  Annual physical exam - MM DIGITAL SCREENING BILATERAL; Future - Lipid panel - COMPLETE METABOLIC PANEL WITH GFR - CBC  Osteoporosis - alendronate (FOSAMAX) 70 MG tablet; Take 1 tablet (70 mg total) by mouth once a week. Take with a full glass of water on an empty stomach.  Osteoarthritis, unspecified osteoarthritis type, unspecified site - celecoxib (CELEBREX) 200 MG capsule; Take 1 capsule (200 mg total) by mouth 2 (two) times daily.  Low back pain without sciatica, unspecified back pain laterality - oxyCODONE-acetaminophen (PERCOCET/ROXICET) 5-325 MG per tablet; Take 1 tablet by mouth every 12 (twelve) hours as needed.  Insomnia  Other Orders - ranitidine (ZANTAC) 150 MG capsule; Take 1 capsule (150 mg total) by mouth every evening. - amitriptyline (ELAVIL) 10 MG tablet; Take 1 tablet (10 mg total) by mouth at bedtime as needed for sleep. - estradiol (ESTRACE) 0.5 MG tablet; TAKE ONE TABLET BY MOUTH ONCE DAILY - lisinopril (PRINIVIL,ZESTRIL) 20 MG tablet; TAKE ONE TABLET BY MOUTH ONCE DAILY    Healthy lifestyle interventions including but not limited to regular exercise, a healthy low fat diet, moderation of salt intake, the dangers of tobacco/alcohol/recreational drug use, nutrition supplementation, and accident avoidance were discussed with the patient and a handout was provided for future reference.  Lipid panel with liver enzymes, she would like to know if she can stop taking Lipitor if her cholesterol is normal, she would hope to not take this for 3-6 months to see if she still really needs to be on it after her AHA score is calculated when she is no longer  taking cholesterol medication. Refills provided for all other chronic conditions, start ranitidine for GERD for at least one month on a nightly basis. Starting amitriptyline to help with sleep.  Return for 3-6 months lab visit for cholesterol if cholesterol is good today and we stop generic lipitor.

## 2013-11-26 LAB — HEMOGLOBIN A1C
HEMOGLOBIN A1C: 5.7 % — AB (ref <5.7)
Mean Plasma Glucose: 117 mg/dL — ABNORMAL HIGH (ref <117)

## 2013-12-26 ENCOUNTER — Other Ambulatory Visit: Payer: Self-pay | Admitting: Family Medicine

## 2013-12-27 ENCOUNTER — Other Ambulatory Visit: Payer: Self-pay

## 2013-12-27 DIAGNOSIS — M545 Low back pain: Secondary | ICD-10-CM

## 2013-12-27 MED ORDER — OXYCODONE-ACETAMINOPHEN 5-325 MG PO TABS: 1.0000 | ORAL_TABLET | Freq: Two times a day (BID) | ORAL | Status: DC | PRN

## 2013-12-29 ENCOUNTER — Ambulatory Visit (INDEPENDENT_AMBULATORY_CARE_PROVIDER_SITE_OTHER): Payer: BC Managed Care – PPO

## 2013-12-29 ENCOUNTER — Ambulatory Visit: Payer: BC Managed Care – PPO

## 2013-12-29 DIAGNOSIS — Z1239 Encounter for other screening for malignant neoplasm of breast: Secondary | ICD-10-CM

## 2014-01-16 ENCOUNTER — Other Ambulatory Visit: Payer: Self-pay | Admitting: Family Medicine

## 2014-01-19 ENCOUNTER — Other Ambulatory Visit: Payer: Self-pay | Admitting: Family Medicine

## 2014-02-01 ENCOUNTER — Telehealth: Payer: Self-pay | Admitting: Family Medicine

## 2014-02-01 ENCOUNTER — Encounter: Payer: Self-pay | Admitting: Family Medicine

## 2014-02-01 DIAGNOSIS — M545 Low back pain: Secondary | ICD-10-CM

## 2014-02-01 MED ORDER — ESTRADIOL 1 MG PO TABS: 1.0000 mg | ORAL_TABLET | Freq: Every day | ORAL | Status: DC

## 2014-02-01 MED ORDER — OXYCODONE-ACETAMINOPHEN 5-325 MG PO TABS: 1.0000 | ORAL_TABLET | Freq: Two times a day (BID) | ORAL | Status: DC | PRN

## 2014-02-01 NOTE — Telephone Encounter (Signed)
Sue Smith, Rx placed in in-box ready for pickup/faxing.  

## 2014-02-01 NOTE — Telephone Encounter (Signed)
rx faxed

## 2014-02-14 ENCOUNTER — Other Ambulatory Visit: Payer: Self-pay | Admitting: Family Medicine

## 2014-02-23 ENCOUNTER — Other Ambulatory Visit: Payer: Self-pay | Admitting: Family Medicine

## 2014-02-23 ENCOUNTER — Telehealth: Payer: Self-pay | Admitting: Family Medicine

## 2014-02-23 DIAGNOSIS — M545 Low back pain: Secondary | ICD-10-CM

## 2014-02-23 MED ORDER — OXYCODONE-ACETAMINOPHEN 5-325 MG PO TABS: 1.0000 | ORAL_TABLET | Freq: Two times a day (BID) | ORAL | Status: DC | PRN

## 2014-02-23 NOTE — Telephone Encounter (Signed)
Pt.notified

## 2014-02-23 NOTE — Telephone Encounter (Signed)
Seth Bake, Patient is requesting refill, also requesting increase in frequency of dosing, i'll print off an Rx of the usual frequency however any consideration of changes in dosing requires an office visit for exam.

## 2014-02-27 ENCOUNTER — Other Ambulatory Visit: Payer: Self-pay | Admitting: Family Medicine

## 2014-03-11 ENCOUNTER — Ambulatory Visit (INDEPENDENT_AMBULATORY_CARE_PROVIDER_SITE_OTHER): Payer: BLUE CROSS/BLUE SHIELD | Admitting: Family Medicine

## 2014-03-11 ENCOUNTER — Encounter: Payer: Self-pay | Admitting: Family Medicine

## 2014-03-11 VITALS — BP 164/88 | HR 64 | Wt 141.0 lb

## 2014-03-11 DIAGNOSIS — I10 Essential (primary) hypertension: Secondary | ICD-10-CM

## 2014-03-11 DIAGNOSIS — R232 Flushing: Secondary | ICD-10-CM

## 2014-03-11 DIAGNOSIS — R7309 Other abnormal glucose: Secondary | ICD-10-CM

## 2014-03-11 DIAGNOSIS — H6121 Impacted cerumen, right ear: Secondary | ICD-10-CM

## 2014-03-11 DIAGNOSIS — M545 Low back pain: Secondary | ICD-10-CM

## 2014-03-11 DIAGNOSIS — M5136 Other intervertebral disc degeneration, lumbar region: Secondary | ICD-10-CM

## 2014-03-11 DIAGNOSIS — E785 Hyperlipidemia, unspecified: Secondary | ICD-10-CM | POA: Diagnosis not present

## 2014-03-11 DIAGNOSIS — R7303 Prediabetes: Secondary | ICD-10-CM

## 2014-03-11 LAB — LIPID PANEL
Cholesterol: 240 mg/dL — ABNORMAL HIGH (ref 0–200)
HDL: 45 mg/dL — ABNORMAL LOW (ref >=46)
LDL Cholesterol: 167 mg/dL — ABNORMAL HIGH (ref 0–99)
Total CHOL/HDL Ratio: 5.3 Ratio
Triglycerides: 138 mg/dL (ref <150)
VLDL: 28 mg/dL (ref 0–40)

## 2014-03-11 LAB — HEMOGLOBIN A1C
HEMOGLOBIN A1C: 5.7 % — AB (ref <5.7)
MEAN PLASMA GLUCOSE: 117 mg/dL — AB (ref <117)

## 2014-03-11 MED ORDER — ESTRADIOL 1 MG PO TABS: 1.0000 mg | ORAL_TABLET | Freq: Every day | ORAL | Status: DC

## 2014-03-11 MED ORDER — OXYCODONE-ACETAMINOPHEN 5-325 MG PO TABS: 1.0000 | ORAL_TABLET | Freq: Three times a day (TID) | ORAL | Status: DC | PRN

## 2014-03-11 MED ORDER — GABAPENTIN 300 MG PO CAPS: 300.0000 mg | ORAL_CAPSULE | Freq: Every day | ORAL | Status: DC

## 2014-03-11 NOTE — Progress Notes (Signed)
CC: Sue Smith is a 59 y.o. female is here for Medication Refill and Labs Only   Subjective: HPI:  Complains of right ear fullness with mild decrease in hearing. Been present for about a week now accompanied by nasal congestion and postnasal drip. Ear discomfort is moderate in severity no interventions as of yet nothing particularly makes it better or worse. The accompanying symptoms are only mild in severity and have been fluctuating throughout the day. Denies any other motor or sensory disturbances other than that described below.  Follow-up essential hypertension: She's lost 5 pounds over the last 2 months intentionally with portion control. No outside blood pressures to report. She's been able to handle her blood pressure with diet and exercise in the past and is surprised that her blood pressure is elevated today. Denies chest pain shortness of breath orthopnea nor peripheral edema but does admit that she's been much less active over the past 3 months due to the weather.  Follow-up degenerative disc disease: She's noticed that hydrocodone is only effective for about 5 hours. She's felt the need to take an additional pill in the midday. Symptoms of midline back pain that radiated down the back of the legs when lying on the ipsilateral side are 100% alleviated 30 minutes after taking the pill but only for 5 hours. She also has some numbness on her lateral hips when lying on the ipsilateral side at bedtime. Systems are interfering with sleep. She denies any new character to her pain.  Follow-up prediabetes: No polyuria palpation or polydipsia  Follow-up vasomotor flushing: She only has one hot flashes day taking estradiol 1 mg daily and denies any side effects. She is happy with the reduction of her hot flashes.  Follow-up hyperlipidemia: She's been off of Lipitor for 3 months now. No chest pain shortness of breath orthopnea nor peripheral edema   Review Of Systems Outlined In HPI  Past  Medical History  Diagnosis Date  . Cervical dysplasia     Past Surgical History  Procedure Laterality Date  . Abdominal hysterectomy    . Total abdominal hysterectomy w/ bilateral salpingoophorectomy    . Appendectomy     Family History  Problem Relation Age of Onset  . Cancer Mother   . Heart failure Father     History   Social History  . Marital Status: Widowed    Spouse Name: N/A  . Number of Children: N/A  . Years of Education: N/A   Occupational History  . Not on file.   Social History Main Topics  . Smoking status: Former Smoker    Quit date: 01/14/2010  . Smokeless tobacco: Not on file  . Alcohol Use: 1.0 oz/week    2 drink(s) per week  . Drug Use: No  . Sexual Activity: Not on file   Other Topics Concern  . Not on file   Social History Narrative     Objective: BP 164/88 mmHg  Pulse 64  Wt 141 lb (63.957 kg)  General: Alert and Oriented, No Acute Distress HEENT: Pupils equal, round, reactive to light. Conjunctivae clear. On initial exam right cerumen impaction obscures the ear canal. Following cerumen removal External ears unremarkable, canals clear with intact TMs with appropriate landmarks.  Middle ear appears open without effusion. Pink inferior turbinates.  Moist mucous membranes, pharynx without inflammation nor lesions.  Neck supple without palpable lymphadenopathy nor abnormal masses. Lungs: Clear to auscultation bilaterally, no wheezing/ronchi/rales.  Comfortable work of breathing. Good air movement. Cardiac: Regular  rate and rhythm. Normal S1/S2.  No murmurs, rubs, nor gallops.   Extremities: No peripheral edema.  Strong peripheral pulses.  Mental Status: No depression, anxiety, nor agitation. Skin: Warm and dry. Back: No midline spinous process tenderness in the lumbar region, full range of motion and strength of both lower extremities  Assessment & Plan: Sue Smith was seen today for medication refill and labs only.  Diagnoses and all orders for  this visit:  DDD (degenerative disc disease), lumbar Orders: -     gabapentin (NEURONTIN) 300 MG capsule; Take 1 capsule (300 mg total) by mouth at bedtime. To prevent nerve pain. -     oxyCODONE-acetaminophen (PERCOCET/ROXICET) 5-325 MG per tablet; Take 1 tablet by mouth every 8 (eight) hours as needed.  Essential hypertension  Hyperlipidemia Orders: -     Lipid panel  Pre-diabetes Orders: -     Hemoglobin A1c  Low back pain without sciatica, unspecified back pain laterality Orders: -     oxyCODONE-acetaminophen (PERCOCET/ROXICET) 5-325 MG per tablet; Take 1 tablet by mouth every 8 (eight) hours as needed.  Vasomotor flushing Orders: -     estradiol (ESTRACE) 1 MG tablet; Take 1 tablet (1 mg total) by mouth daily.  Cerumen impaction, right   Degenerative disc disease: Uncontrolled, adding gabapentin at night in place of amitriptyline. May increase an additional dose of oxycodone during the day Essential hypertension: Uncontrolled today, discussed diet and exercise interventions which have been successful in the past prior to initiating antihypertensive Prediabetes: Clinically controlled but due for A1c Vasomotor flushing: Control continue estradiol  Indication: Cerumen impaction of the ear Medical necessity statement: On physical examination, cerumen impairs clinically significant portions of the external auditory canal, and tympanic membrane. Noted obstructive, copious cerumen that cannot be removed without magnification and instrumentations requiring physician skills Consent: Discussed benefits and risks of procedure and verbal consent obtained Procedure: Patient was prepped for the procedure. Utilized an otoscope to assess and take note of the ear canal, the tympanic membrane, and the presence, amount, and placement of the cerumen. Gentle water irrigation and soft plastic curette was utilized to remove cerumen.  Post procedure examination: shows cerumen was completely  removed. Patient tolerated procedure well. The patient is made aware that they may experience temporary vertigo, temporary hearing loss, and temporary discomfort. If these symptom last for more than 24 hours to call the clinic or proceed to the ED.      Return in about 3 months (around 06/09/2014) for BP follow up.

## 2014-03-14 ENCOUNTER — Telehealth: Payer: Self-pay | Admitting: Family Medicine

## 2014-03-14 MED ORDER — ATORVASTATIN CALCIUM 20 MG PO TABS: 20.0000 mg | ORAL_TABLET | Freq: Every day | ORAL | Status: DC

## 2014-03-14 NOTE — Telephone Encounter (Signed)
Left message on vm

## 2014-03-14 NOTE — Telephone Encounter (Signed)
Seth Bake, Will you please let Ms. Thielke know that her cholesterol has risen back to a range that puts her at significant risk of having a heart attack or stroke in the next ten years.  I would strongly recommend she restart her atorvastatin, I've sent a new Rx to her [facility].  Blood sugar remains just barely in the prediabetic range however very stable over the past two years.

## 2014-04-10 ENCOUNTER — Telehealth: Payer: Self-pay | Admitting: Family Medicine

## 2014-04-10 DIAGNOSIS — M5136 Other intervertebral disc degeneration, lumbar region: Secondary | ICD-10-CM

## 2014-04-10 DIAGNOSIS — M545 Low back pain: Secondary | ICD-10-CM

## 2014-04-12 ENCOUNTER — Other Ambulatory Visit: Payer: Self-pay | Admitting: Family Medicine

## 2014-04-13 MED ORDER — OXYCODONE-ACETAMINOPHEN 5-325 MG PO TABS: 1.0000 | ORAL_TABLET | Freq: Three times a day (TID) | ORAL | Status: DC | PRN

## 2014-04-13 NOTE — Telephone Encounter (Signed)
Left message for pt and rx up at the front

## 2014-04-13 NOTE — Telephone Encounter (Signed)
Andrea, Rx placed in in-box ready for pickup/faxing.  

## 2014-05-08 ENCOUNTER — Other Ambulatory Visit: Payer: Self-pay | Admitting: Family Medicine

## 2014-05-08 DIAGNOSIS — M5136 Other intervertebral disc degeneration, lumbar region: Secondary | ICD-10-CM

## 2014-05-08 DIAGNOSIS — M545 Low back pain: Secondary | ICD-10-CM

## 2014-05-09 MED ORDER — OXYCODONE-ACETAMINOPHEN 5-325 MG PO TABS: 1.0000 | ORAL_TABLET | Freq: Three times a day (TID) | ORAL | Status: DC | PRN

## 2014-05-09 NOTE — Telephone Encounter (Signed)
Left detailed message on patient vm that Oxycodone was ready for pickup. Rossana Molchan,CMA

## 2014-05-09 NOTE — Telephone Encounter (Signed)
Sue Smith, Rx placed in in-box ready for pickup/faxing.  

## 2014-05-09 NOTE — Telephone Encounter (Signed)
Message left on vm by rhonda. rx up front

## 2014-06-06 ENCOUNTER — Encounter: Payer: Self-pay | Admitting: Family Medicine

## 2014-06-06 ENCOUNTER — Ambulatory Visit (INDEPENDENT_AMBULATORY_CARE_PROVIDER_SITE_OTHER): Payer: BLUE CROSS/BLUE SHIELD | Admitting: Family Medicine

## 2014-06-06 VITALS — BP 135/76 | HR 71 | Ht 67.0 in | Wt 140.0 lb

## 2014-06-06 DIAGNOSIS — M5136 Other intervertebral disc degeneration, lumbar region: Secondary | ICD-10-CM

## 2014-06-06 DIAGNOSIS — E785 Hyperlipidemia, unspecified: Secondary | ICD-10-CM

## 2014-06-06 DIAGNOSIS — I10 Essential (primary) hypertension: Secondary | ICD-10-CM | POA: Diagnosis not present

## 2014-06-06 MED ORDER — OXYCODONE-ACETAMINOPHEN 10-325 MG PO TABS: 0.5000 | ORAL_TABLET | Freq: Three times a day (TID) | ORAL | Status: DC | PRN

## 2014-06-06 NOTE — Progress Notes (Signed)
CC: Sue Smith is a 59 y.o. female is here for Follow-up   Subjective: HPI:  Follow-up essential hypertension: Continues on lisinopril 20 mg daily. Blood pressure at home consistently below 140/90. Denies chest pain shortness of breath orthopnea peripheral edema cough nor angioedema.  Follow-up hyperlipidemia: Since I saw her last she restarted atorvastatin for an uncontrolled LDL elevation. Denies right upper quadrant pain or myalgias. No limb claudication. Denies motor or sensory disturbances  Follow DDD: Taking Percocet twice a day she's noticed that she has to double up on her morning dose if she is going to be active in the daytime. She's had to take on more physical activity around the house and on her property because of her significant other recently having episodes of decompensated congestive heart failure. She actually had to revive him with CPR since I saw her last. Since she's been more physically involved around the house her low back pain has been slightly worse still with a radicular component to the left lateral hip. Denies any weakness or any other motor or sensory disturbances pain is only described as pain moderate in severity After Taking Percocet for A Few Hours. Review Of Systems Outlined In HPI  Past Medical History  Diagnosis Date  . Cervical dysplasia     Past Surgical History  Procedure Laterality Date  . Abdominal hysterectomy    . Total abdominal hysterectomy w/ bilateral salpingoophorectomy    . Appendectomy     Family History  Problem Relation Age of Onset  . Cancer Mother   . Heart failure Father     History   Social History  . Marital Status: Widowed    Spouse Name: N/A  . Number of Children: N/A  . Years of Education: N/A   Occupational History  . Not on file.   Social History Main Topics  . Smoking status: Former Smoker    Quit date: 01/14/2010  . Smokeless tobacco: Not on file  . Alcohol Use: 1.0 oz/week    2 drink(s) per week  .  Drug Use: No  . Sexual Activity: Not on file   Other Topics Concern  . Not on file   Social History Narrative     Objective: BP 135/76 mmHg  Pulse 71  Ht 5\' 7"  (1.702 m)  Wt 140 lb (63.504 kg)  BMI 21.92 kg/m2  General: Alert and Oriented, No Acute Distress HEENT: Pupils equal, round, reactive to light. Conjunctivae clear.  Moist mucous membranes Lungs: Clear to auscultation bilaterally, no wheezing/ronchi/rales.  Comfortable work of breathing. Good air movement. Cardiac: Regular rate and rhythm. Normal S1/S2.  No murmurs, rubs, nor gallops.   Abdomen: Soft and flat Extremities: No peripheral edema.  Strong peripheral pulses.  Mental Status: No depression, anxiety, nor agitation. Skin: Warm and dry.  Assessment & Plan: Sue Smith was seen today for follow-up.  Diagnoses and all orders for this visit:  Essential hypertension  Hyperlipidemia  DDD (degenerative disc disease), lumbar  Other orders -     oxyCODONE-acetaminophen (PERCOCET) 10-325 MG per tablet; Take 0.5-1 tablets by mouth every 8 (eight) hours as needed for pain.   Essential hypertension: Controlled continue lisinopril Hyperlipidemia: Currently controlled continuing atorvastatin indefinitely Degenerative disc disease: Symptomatically worsened increasing as needed Percocet.   Return in about 3 months (around 09/06/2014) for HTN.

## 2014-06-15 ENCOUNTER — Other Ambulatory Visit: Payer: Self-pay | Admitting: Family Medicine

## 2014-07-04 ENCOUNTER — Telehealth: Payer: Self-pay | Admitting: Family Medicine

## 2014-07-04 DIAGNOSIS — M5136 Other intervertebral disc degeneration, lumbar region: Secondary | ICD-10-CM

## 2014-07-05 MED ORDER — OXYCODONE-ACETAMINOPHEN 10-325 MG PO TABS: 0.5000 | ORAL_TABLET | Freq: Three times a day (TID) | ORAL | Status: DC | PRN

## 2014-07-05 NOTE — Addendum Note (Signed)
Addended by: Marcial Pacas on: 07/05/2014 02:36 PM   Modules accepted: Orders

## 2014-07-05 NOTE — Telephone Encounter (Signed)
Pt notified and rx is up front

## 2014-07-05 NOTE — Telephone Encounter (Signed)
Andrea, Rx placed in in-box ready for pickup/faxing.  

## 2014-07-14 ENCOUNTER — Other Ambulatory Visit: Payer: Self-pay | Admitting: Family Medicine

## 2014-07-15 ENCOUNTER — Other Ambulatory Visit: Payer: Self-pay

## 2014-07-15 MED ORDER — LISINOPRIL 20 MG PO TABS: 20.0000 mg | ORAL_TABLET | Freq: Every day | ORAL | Status: DC

## 2014-08-02 ENCOUNTER — Telehealth: Payer: Self-pay | Admitting: Family Medicine

## 2014-08-02 MED ORDER — OXYCODONE-ACETAMINOPHEN 10-325 MG PO TABS: 0.5000 | ORAL_TABLET | Freq: Three times a day (TID) | ORAL | Status: DC | PRN

## 2014-08-02 NOTE — Telephone Encounter (Signed)
rx placed up front by [facility]

## 2014-08-02 NOTE — Addendum Note (Signed)
Addended by: Marcial Pacas on: 08/02/2014 12:12 PM   Modules accepted: Orders

## 2014-08-02 NOTE — Telephone Encounter (Signed)
Sue Smith, Rx placed in in-box ready for pickup/faxing.  

## 2014-08-09 ENCOUNTER — Encounter: Payer: Self-pay | Admitting: Family Medicine

## 2014-08-24 ENCOUNTER — Encounter: Payer: Self-pay | Admitting: Family Medicine

## 2014-08-24 ENCOUNTER — Ambulatory Visit (INDEPENDENT_AMBULATORY_CARE_PROVIDER_SITE_OTHER): Payer: BLUE CROSS/BLUE SHIELD | Admitting: Family Medicine

## 2014-08-24 VITALS — BP 147/84 | HR 63 | Temp 98.0°F | Ht 67.0 in | Wt 133.0 lb

## 2014-08-24 DIAGNOSIS — N390 Urinary tract infection, site not specified: Secondary | ICD-10-CM

## 2014-08-24 DIAGNOSIS — R232 Flushing: Secondary | ICD-10-CM | POA: Diagnosis not present

## 2014-08-24 DIAGNOSIS — R7303 Prediabetes: Secondary | ICD-10-CM

## 2014-08-24 DIAGNOSIS — R7309 Other abnormal glucose: Secondary | ICD-10-CM | POA: Diagnosis not present

## 2014-08-24 DIAGNOSIS — I10 Essential (primary) hypertension: Secondary | ICD-10-CM

## 2014-08-24 DIAGNOSIS — M5136 Other intervertebral disc degeneration, lumbar region: Secondary | ICD-10-CM

## 2014-08-24 LAB — POCT GLYCOSYLATED HEMOGLOBIN (HGB A1C): Hemoglobin A1C: 5.6

## 2014-08-24 LAB — POCT URINALYSIS DIPSTICK
Bilirubin, UA: NEGATIVE
Glucose, UA: NEGATIVE
Ketones, UA: NEGATIVE
Nitrite, UA: POSITIVE
PH UA: 7
Protein, UA: NEGATIVE
SPEC GRAV UA: 1.025
UROBILINOGEN UA: 0.2

## 2014-08-24 MED ORDER — LEVOFLOXACIN 500 MG PO TABS: 500.0000 mg | ORAL_TABLET | Freq: Every day | ORAL | Status: DC

## 2014-08-24 MED ORDER — OXYCODONE-ACETAMINOPHEN 10-325 MG PO TABS: 0.5000 | ORAL_TABLET | Freq: Three times a day (TID) | ORAL | Status: DC | PRN

## 2014-08-24 MED ORDER — LISINOPRIL 20 MG PO TABS: 20.0000 mg | ORAL_TABLET | Freq: Every day | ORAL | Status: DC

## 2014-08-24 MED ORDER — ESTRADIOL 1 MG PO TABS: 1.0000 mg | ORAL_TABLET | Freq: Every day | ORAL | Status: DC

## 2014-08-24 NOTE — Progress Notes (Signed)
CC: Sue Smith is a 59 y.o. female is here for Abdominal Pain; Urinary Frequency; and Cough   Subjective: HPI:  Follow-up essential hypertension: Continues to take lisinopril on a daily basis. She is requesting refills today. Blood pressures at home are consistently below 140/90. Denies chest pain peripheral edema nor motor or sensory disturbances other than hot flashes.  Follow-up degenerative disc disease: She tells me she has absolutely no pain in the lower background she takes a full Percocet every 8 hours. She occasionally can get by with only taking half a tablet every 8 hours. If she does not take this medication she has severe midline back pain that does not radiate that interferes with quality of life. She's been able to garden the majority of the summer without any pain.  Follow-up prediabetes: No outside blood sugars to report. No polyuria plication or polydipsia.  Requesting refills on estradiol. She's taking this on a daily basis with no known side effects. She has a hot flash a few times a week, frequently is significantly lowered since this medication was started.  Complains of low pelvic pain with dysuria present for the past few days. Seems to be worsening. No interventions as of yet other than increasing fluids. Nothing seems to help but or make it worse. She tells me feels like prior urinary tract infections.   Review Of Systems Outlined In HPI  Past Medical History  Diagnosis Date  . Cervical dysplasia     Past Surgical History  Procedure Laterality Date  . Abdominal hysterectomy    . Total abdominal hysterectomy w/ bilateral salpingoophorectomy    . Appendectomy     Family History  Problem Relation Age of Onset  . Cancer Mother   . Heart failure Father     Social History   Social History  . Marital Status: Widowed    Spouse Name: N/A  . Number of Children: N/A  . Years of Education: N/A   Occupational History  . Not on file.   Social History  Main Topics  . Smoking status: Former Smoker    Quit date: 01/14/2010  . Smokeless tobacco: Not on file  . Alcohol Use: 1.0 oz/week    2 drink(s) per week  . Drug Use: No  . Sexual Activity: Not on file   Other Topics Concern  . Not on file   Social History Narrative     Objective: BP 147/84 mmHg  Pulse 63  Temp(Src) 98 F (36.7 C) (Oral)  Ht 5\' 7"  (1.702 m)  Wt 133 lb (60.328 kg)  BMI 20.83 kg/m2  SpO2 98%  General: Alert and Oriented, No Acute Distress HEENT: Pupils equal, round, reactive to light. Conjunctivae clear.  Moist mucous membranes pharynx Unremarkable Lungs: Comfortable work of breathing with mild rhonchi in all lung fields with inspiration only. No wheezing or rales. Cardiac: Regular rate and rhythm. Normal S1/S2.  No murmurs, rubs, nor gallops.   Extremities: No peripheral edema.  Strong peripheral pulses.  Mental Status: No depression, anxiety, nor agitation. Skin: Warm and dry.  Assessment & Plan: Sue Smith was seen today for abdominal pain, urinary frequency and cough.  Diagnoses and all orders for this visit:  Essential hypertension -     lisinopril (PRINIVIL,ZESTRIL) 20 MG tablet; Take 1 tablet (20 mg total) by mouth daily.  DDD (degenerative disc disease), lumbar -     oxyCODONE-acetaminophen (PERCOCET) 10-325 MG per tablet; Take 0.5-1 tablets by mouth every 8 (eight) hours as needed for pain.  Pre-diabetes -     POCT HgB A1C  Vasomotor flushing -     estradiol (ESTRACE) 1 MG tablet; Take 1 tablet (1 mg total) by mouth daily.  UTI (lower urinary tract infection) -     levofloxacin (LEVAQUIN) 500 MG tablet; Take 1 tablet (500 mg total) by mouth daily. -     POCT urinalysis dipstick -     Urine Culture   Essential hypertension: Uncontrolled today, no change lisinopril, I'd like her to check her blood pressure after urinary tract infection is resolved and it gets above 140/90 return to discuss medication changes. Degenerative disc disease:  Controlled continue Percocet. Prediabetes: A1c of 5.6, normal, continue to stay active on a daily basis  Vasomotor flushing: Controlled on estradiol Urinalysis highly suggestive of UTI therefore starting Levaquin, this antibiotic was chosen given a recent productive cough that she has been experiencing which probably represents acute on chronic bronchitis.   Return in about 3 months (around 11/24/2014).

## 2014-08-26 ENCOUNTER — Telehealth: Payer: Self-pay | Admitting: *Deleted

## 2014-08-26 MED ORDER — PREDNISONE 20 MG PO TABS
ORAL_TABLET | ORAL | Status: AC
Start: 2014-08-26 — End: 2014-08-31

## 2014-08-26 NOTE — Telephone Encounter (Signed)
Pt left vm stating that she isn't feeling much better and that you said you could send her over some prednisone.

## 2014-08-26 NOTE — Telephone Encounter (Signed)
LMOM notifying pt of rx. 

## 2014-08-26 NOTE — Telephone Encounter (Signed)
Museum/gallery conservator, Rx sent to [facility]

## 2014-08-27 LAB — URINE CULTURE: Colony Count: 95000

## 2014-08-29 ENCOUNTER — Encounter: Payer: Self-pay | Admitting: Family Medicine

## 2014-08-29 ENCOUNTER — Ambulatory Visit (INDEPENDENT_AMBULATORY_CARE_PROVIDER_SITE_OTHER): Payer: BLUE CROSS/BLUE SHIELD

## 2014-08-29 ENCOUNTER — Telehealth: Payer: Self-pay | Admitting: Family Medicine

## 2014-08-29 DIAGNOSIS — R05 Cough: Secondary | ICD-10-CM

## 2014-08-29 DIAGNOSIS — R059 Cough, unspecified: Secondary | ICD-10-CM

## 2014-08-29 DIAGNOSIS — J449 Chronic obstructive pulmonary disease, unspecified: Secondary | ICD-10-CM

## 2014-08-29 NOTE — Telephone Encounter (Signed)
Amber, Will you please let patient know that I've put in an order for a chest xray to further workup her cough.  No appt needed, just show up to radiology.

## 2014-08-29 NOTE — Telephone Encounter (Signed)
LMOM notifying pt to go to imaging for chest xray.

## 2014-09-26 ENCOUNTER — Other Ambulatory Visit: Payer: Self-pay | Admitting: Family Medicine

## 2014-09-26 DIAGNOSIS — M5136 Other intervertebral disc degeneration, lumbar region: Secondary | ICD-10-CM

## 2014-09-27 MED ORDER — OXYCODONE-ACETAMINOPHEN 10-325 MG PO TABS: 0.5000 | ORAL_TABLET | Freq: Three times a day (TID) | ORAL | Status: DC | PRN

## 2014-10-23 ENCOUNTER — Other Ambulatory Visit: Payer: Self-pay | Admitting: Family Medicine

## 2014-10-23 DIAGNOSIS — M5136 Other intervertebral disc degeneration, lumbar region: Secondary | ICD-10-CM

## 2014-10-24 MED ORDER — OXYCODONE-ACETAMINOPHEN 10-325 MG PO TABS: 0.5000 | ORAL_TABLET | Freq: Three times a day (TID) | ORAL | Status: DC | PRN

## 2014-11-20 ENCOUNTER — Telehealth: Payer: Self-pay | Admitting: Sports Medicine

## 2014-11-20 DIAGNOSIS — M5136 Other intervertebral disc degeneration, lumbar region: Secondary | ICD-10-CM

## 2014-11-21 MED ORDER — OXYCODONE-ACETAMINOPHEN 10-325 MG PO TABS: 0.5000 | ORAL_TABLET | Freq: Three times a day (TID) | ORAL | Status: DC | PRN

## 2014-11-21 NOTE — Telephone Encounter (Signed)
Sue Smith would like a refill of the following medications: oxyCODONE-acetaminophen (PERCOCET) 10-325 MG tablet   Is this refill appropriate?

## 2014-11-21 NOTE — Telephone Encounter (Signed)
[place], Rx placed in in-box ready for pickup/faxing.  

## 2014-11-21 NOTE — Telephone Encounter (Signed)
I have never seen this patient, routing to Dr. Ileene Rubens.

## 2014-11-22 NOTE — Telephone Encounter (Signed)
Pt.notified

## 2014-12-01 ENCOUNTER — Other Ambulatory Visit: Payer: Self-pay | Admitting: Family Medicine

## 2014-12-01 ENCOUNTER — Ambulatory Visit (INDEPENDENT_AMBULATORY_CARE_PROVIDER_SITE_OTHER): Payer: BLUE CROSS/BLUE SHIELD | Admitting: Family Medicine

## 2014-12-01 ENCOUNTER — Encounter: Payer: Self-pay | Admitting: Family Medicine

## 2014-12-01 VITALS — BP 138/80 | HR 66 | Wt 134.0 lb

## 2014-12-01 DIAGNOSIS — M810 Age-related osteoporosis without current pathological fracture: Secondary | ICD-10-CM

## 2014-12-01 DIAGNOSIS — Z1231 Encounter for screening mammogram for malignant neoplasm of breast: Secondary | ICD-10-CM

## 2014-12-01 DIAGNOSIS — Z23 Encounter for immunization: Secondary | ICD-10-CM

## 2014-12-01 DIAGNOSIS — Z0000 Encounter for general adult medical examination without abnormal findings: Secondary | ICD-10-CM

## 2014-12-01 LAB — CBC
HEMATOCRIT: 45.7 % (ref 36.0–46.0)
HEMOGLOBIN: 14.9 g/dL (ref 12.0–15.0)
MCH: 31 pg (ref 26.0–34.0)
MCHC: 32.6 g/dL (ref 30.0–36.0)
MCV: 95 fL (ref 78.0–100.0)
MPV: 10.6 fL (ref 8.6–12.4)
Platelets: 194 10*3/uL (ref 150–400)
RBC: 4.81 MIL/uL (ref 3.87–5.11)
RDW: 12.7 % (ref 11.5–15.5)
WBC: 6 10*3/uL (ref 4.0–10.5)

## 2014-12-01 LAB — COMPLETE METABOLIC PANEL WITH GFR
ALBUMIN: 4.2 g/dL (ref 3.6–5.1)
ALT: 8 U/L (ref 6–29)
AST: 9 U/L — ABNORMAL LOW (ref 10–35)
Alkaline Phosphatase: 56 U/L (ref 33–130)
BUN: 13 mg/dL (ref 7–25)
CO2: 28 mmol/L (ref 20–31)
Calcium: 9.6 mg/dL (ref 8.6–10.4)
Chloride: 104 mmol/L (ref 98–110)
Creat: 0.76 mg/dL (ref 0.50–1.05)
GFR, EST NON AFRICAN AMERICAN: 87 mL/min (ref >=60)
GFR, Est African American: >89 mL/min (ref >=60)
GLUCOSE: 89 mg/dL (ref 65–99)
POTASSIUM: 4.9 mmol/L (ref 3.5–5.3)
SODIUM: 141 mmol/L (ref 135–146)
Total Bilirubin: 0.5 mg/dL (ref 0.2–1.2)
Total Protein: 6.9 g/dL (ref 6.1–8.1)

## 2014-12-01 LAB — LIPID PANEL
CHOL/HDL RATIO: 3.1 ratio (ref <=5.0)
Cholesterol: 141 mg/dL (ref 125–200)
HDL: 46 mg/dL (ref >=46)
LDL CALC: 80 mg/dL (ref <130)
Triglycerides: 75 mg/dL (ref <150)
VLDL: 15 mg/dL (ref <30)

## 2014-12-01 MED ORDER — ALBUTEROL SULFATE 108 (90 BASE) MCG/ACT IN AEPB: 2.0000 | INHALATION_SPRAY | Freq: Every day | RESPIRATORY_TRACT | Status: DC

## 2014-12-01 NOTE — Patient Instructions (Addendum)
Psyllium Powder (Metamucil) 2-3 teaspoons in water nightly.   Dr. Lajoyce Lauber General Advice Following Your Complete Physical Exam  The Benefits of Regular Exercise: Unless you suffer from an uncontrolled cardiovascular condition, studies strongly suggest that regular exercise and physical activity will add to both the quality and length of your life.  The World Health Organization recommends 150 minutes of moderate intensity aerobic activity every week.  This is best split over 3-4 days a week, and can be as simple as a brisk walk for just over 35 minutes "most days of the week".  This type of exercise has been shown to lower LDL-Cholesterol, lower average blood sugars, lower blood pressure, lower cardiovascular disease risk, improve memory, and increase one's overall sense of wellbeing.  The addition of anaerobic (or "strength training") exercises offers additional benefits including but not limited to increased metabolism, prevention of osteoporosis, and improved overall cholesterol levels.  How Can I Strive For A Low-Fat Diet?: Current guidelines recommend that 25-35 percent of your daily energy (food) intake should come from fats.  One might ask how can this be achieved without having to dissect each meal on a daily basis?  Switch to skim or 1% milk instead of whole milk.  Focus on lean meats such as ground [place], fresh fish, baked chicken, and lean cuts of beef as your source of dietary protein.  Limit saturated fat consumption to less than 10% of your daily caloric intake.  Limit trans fatty acid consumption primarily by limiting synthetic trans fats such as partially hydrogenated oils (Ex: fried fast foods).  Substitute olive or vegetable oil for solid fats where possible.  Moderation of Salt Intake: Provided you don't carry a diagnosis of congestive heart failure nor renal failure, I recommend a daily allowance of no more than 2300 mg of salt (sodium).  Keeping under this daily goal is  associated with a decreased risk of cardiovascular events, creeping above it can lead to elevated blood pressures and increases your risk of cardiovascular events.  Milligrams (mg) of salt is listed on all nutrition labels, and your daily intake can add up faster than you think.  Most canned and frozen dinners can pack in over half your daily salt allowance in one meal.    Lifestyle Health Risks: Certain lifestyle choices carry specific health risks.  As you may already know, tobacco use has been associated with increasing one's risk of cardiovascular disease, pulmonary disease, numerous cancers, among many other issues.  What you may not know is that there are medications and nicotine replacement strategies that can more than double your chances of successfully quitting.  I would be thrilled to help manage your quitting strategy if you currently use tobacco products.  When it comes to alcohol use, I've yet to find an "ideal" daily allowance.  Provided an individual does not have a medical condition that is exacerbated by alcohol consumption, general guidelines determine "safe drinking" as no more than two standard drinks for a man or no more than one standard drink for a female per day.  However, much debate still exists on whether any amount of alcohol consumption is technically "safe".  My general advice, keep alcohol consumption to a minimum for general health promotion.  If you or others believe that alcohol, tobacco, or recreational drug use is interfering with your life, I would be happy to provide confidential counseling regarding treatment options.  General "Over The Counter" Nutrition Advice: Postmenopausal women should aim for a daily calcium intake of 1200  mg, however a significant portion of this might already be provided by diets including milk, yogurt, cheese, and other dairy products.  Vitamin D has been shown to help preserve bone density, prevent fatigue, and has even been shown to help reduce  falls in the elderly.  Ensuring a daily intake of 800 Units of Vitamin D is a good place to start to enjoy the above benefits, we can easily check your Vitamin D level to see if you'd potentially benefit from supplementation beyond 800 Units a day.  Folic Acid intake should be of particular concern to women of childbearing age.  Daily consumption of 0000000 mcg of Folic Acid is recommended to minimize the chance of spinal cord defects in a fetus should pregnancy occur.    For many adults, accidents still remain one of the most common culprits when it comes to cause of death.  Some of the simplest but most effective preventitive habits you can adopt include regular seatbelt use, proper helmet use, securing firearms, and regularly testing your smoke and carbon monoxide detectors.  Sue Smith Sue Smith Sue Smith, Sue Smith, Sue Smith [address] Phone: [phone]

## 2014-12-01 NOTE — Progress Notes (Signed)
CC: Sue Smith is a 59 y.o. female is here for Annual Exam   Subjective: HPI:  Colonoscopy: Repeat 06/2017 Papsmear: N/A Hysterectomy Mammogram:Repeat Dec 2016  DEXA: Repeat needed, orders placed  Influenza Vaccine:  Pneumovax: UTD Td/Tdap: UTD Zoster: (Start 57 yo)  Requesting complete physical exam  Complains of productive cough most mornings. It lasts only a few hours. She's also had some shortness of breath if she has a viral illness or allergies are bothering her.  Review of Systems - General ROS: negative for - chills, fever, night sweats, weight gain or weight loss Ophthalmic ROS: negative for - decreased vision Psychological ROS: negative for - anxiety or depression ENT ROS: negative for - hearing change, nasal congestion, tinnitus or allergies Hematological and Lymphatic ROS: negative for - bleeding problems, bruising or swollen lymph nodes Breast ROS: negative Respiratory ROS: no  wheezing Cardiovascular ROS: no chest pain or dyspnea on exertion Gastrointestinal ROS: no abdominal pain, change in bowel habits, or black or bloody stools Genito-Urinary ROS: negative for - genital discharge, genital ulcers, incontinence or abnormal bleeding from genitals Musculoskeletal ROS: negative for - joint pain or muscle pain Neurological ROS: negative for - headaches or memory loss Dermatological ROS: negative for lumps, mole changes, rash and skin lesion changes  Past Medical History  Diagnosis Date  . Cervical dysplasia     Past Surgical History  Procedure Laterality Date  . Abdominal hysterectomy    . Total abdominal hysterectomy w/ bilateral salpingoophorectomy    . Appendectomy     Family History  Problem Relation Age of Onset  . Cancer Mother   . Heart failure Father     Social History   Social History  . Marital Status: Widowed    Spouse Name: N/A  . Number of Children: N/A  . Years of Education: N/A   Occupational History  . Not on file.   Social  History Main Topics  . Smoking status: Former Smoker    Quit date: 01/14/2010  . Smokeless tobacco: Not on file  . Alcohol Use: 1.0 oz/week    2 drink(s) per week  . Drug Use: No  . Sexual Activity: Not on file   Other Topics Concern  . Not on file   Social History Narrative     Objective: BP 138/80 mmHg  Pulse 66  Wt 134 lb (60.782 kg)  General: No Acute Distress HEENT: Atraumatic, normocephalic, conjunctivae normal without scleral icterus.  No nasal discharge, hearing grossly intact, TMs with good landmarks bilaterally with no middle ear abnormalities, posterior pharynx clear without oral lesions. Neck: Supple, trachea midline, no cervical nor supraclavicular adenopathy. Pulmonary: Clear to auscultation bilaterally without wheezing, rhonchi, nor rales. Cardiac: Regular rate and rhythm.  No murmurs, rubs, nor gallops. No peripheral edema.  2+ peripheral pulses bilaterally. Abdomen: Bowel sounds normal.  No masses.  Non-tender without rebound.  Negative Murphy's sign. MSK: Grossly intact, no signs of weakness.  Full strength throughout upper and lower extremities.  Full ROM in upper and lower extremities.  No midline spinal tenderness. Neuro: Gait unremarkable, CN II-XII grossly intact.  C5-C6 Reflex 2/4 Bilaterally, L4 Reflex 2/4 Bilaterally.  Cerebellar function intact. Skin: No rashes. Psych: Alert and oriented to person/place/time.  Thought process normal. No anxiety/depression.  Assessment & Plan: Sue Smith was seen today for annual exam.  Diagnoses and all orders for this visit:  Annual physical exam -     DG Bone Density; Future -     Lipid panel -  COMPLETE METABOLIC PANEL WITH GFR -     CBC  Osteoporosis -     DG Bone Density; Future -     Lipid panel -     COMPLETE METABOLIC PANEL WITH GFR -     CBC  Other orders -     Albuterol Sulfate (PROAIR RESPICLICK) [id number] (90 BASE) MCG/ACT AEPB; Inhale 2 puffs into the lungs daily.  Healthy lifestyle interventions  including but not limited to regular exercise, a healthy low fat diet, moderation of salt intake, the dangers of tobacco/alcohol/recreational drug use, nutrition supplementation, and accident avoidance were discussed with the patient and a handout was provided for future reference.  Trial of albuterol every morning to help with cough attributed to COPD.  Return in about 6 months (around 05/31/2015) for Blood Pressure.

## 2014-12-10 ENCOUNTER — Other Ambulatory Visit: Payer: Self-pay | Admitting: Family Medicine

## 2014-12-18 ENCOUNTER — Telehealth: Payer: Self-pay | Admitting: Family Medicine

## 2014-12-18 DIAGNOSIS — M5136 Other intervertebral disc degeneration, lumbar region: Secondary | ICD-10-CM

## 2014-12-19 MED ORDER — OXYCODONE-ACETAMINOPHEN 10-325 MG PO TABS: 0.5000 | ORAL_TABLET | Freq: Three times a day (TID) | ORAL | Status: DC | PRN

## 2014-12-19 NOTE — Addendum Note (Signed)
Addended by: Marcial Pacas on: 12/19/2014 09:32 AM   Modules accepted: Orders

## 2014-12-19 NOTE — Telephone Encounter (Signed)
Pt.notified

## 2014-12-19 NOTE — Telephone Encounter (Signed)
[place], Rx placed in in-box ready for pickup/faxing.  

## 2015-01-05 ENCOUNTER — Ambulatory Visit (INDEPENDENT_AMBULATORY_CARE_PROVIDER_SITE_OTHER): Payer: BLUE CROSS/BLUE SHIELD

## 2015-01-05 DIAGNOSIS — M810 Age-related osteoporosis without current pathological fracture: Secondary | ICD-10-CM | POA: Diagnosis not present

## 2015-01-05 DIAGNOSIS — Z0000 Encounter for general adult medical examination without abnormal findings: Secondary | ICD-10-CM

## 2015-01-05 DIAGNOSIS — Z1231 Encounter for screening mammogram for malignant neoplasm of breast: Secondary | ICD-10-CM

## 2015-01-08 ENCOUNTER — Other Ambulatory Visit: Payer: Self-pay | Admitting: Family Medicine

## 2015-01-17 ENCOUNTER — Other Ambulatory Visit: Payer: Self-pay | Admitting: Family Medicine

## 2015-01-17 DIAGNOSIS — M5136 Other intervertebral disc degeneration, lumbar region: Secondary | ICD-10-CM

## 2015-01-17 MED ORDER — OXYCODONE-ACETAMINOPHEN 10-325 MG PO TABS: 0.5000 | ORAL_TABLET | Freq: Three times a day (TID) | ORAL | Status: DC | PRN

## 2015-01-17 NOTE — Telephone Encounter (Signed)
See refill request.

## 2015-01-17 NOTE — Telephone Encounter (Signed)
Sending in refill request for Percocet

## 2015-01-23 ENCOUNTER — Encounter: Payer: Self-pay | Admitting: Family Medicine

## 2015-01-24 MED ORDER — BUDESONIDE-FORMOTEROL FUMARATE 80-4.5 MCG/ACT IN AERO: 2.0000 | INHALATION_SPRAY | Freq: Two times a day (BID) | RESPIRATORY_TRACT | Status: DC

## 2015-02-16 ENCOUNTER — Ambulatory Visit (INDEPENDENT_AMBULATORY_CARE_PROVIDER_SITE_OTHER): Payer: BLUE CROSS/BLUE SHIELD | Admitting: Family Medicine

## 2015-02-16 ENCOUNTER — Encounter: Payer: Self-pay | Admitting: Family Medicine

## 2015-02-16 VITALS — BP 139/80 | HR 71 | Wt 136.0 lb

## 2015-02-16 DIAGNOSIS — J449 Chronic obstructive pulmonary disease, unspecified: Secondary | ICD-10-CM

## 2015-02-16 DIAGNOSIS — M5136 Other intervertebral disc degeneration, lumbar region: Secondary | ICD-10-CM

## 2015-02-16 DIAGNOSIS — F17200 Nicotine dependence, unspecified, uncomplicated: Secondary | ICD-10-CM

## 2015-02-16 DIAGNOSIS — I10 Essential (primary) hypertension: Secondary | ICD-10-CM | POA: Diagnosis not present

## 2015-02-16 DIAGNOSIS — R7303 Prediabetes: Secondary | ICD-10-CM

## 2015-02-16 DIAGNOSIS — J4489 Other specified chronic obstructive pulmonary disease: Secondary | ICD-10-CM | POA: Insufficient documentation

## 2015-02-16 DIAGNOSIS — IMO0001 Reserved for inherently not codable concepts without codable children: Secondary | ICD-10-CM

## 2015-02-16 LAB — POCT GLYCOSYLATED HEMOGLOBIN (HGB A1C): HEMOGLOBIN A1C: 5.7

## 2015-02-16 MED ORDER — VARENICLINE TARTRATE 1 MG PO TABS: 1.0000 mg | ORAL_TABLET | Freq: Two times a day (BID) | ORAL | Status: DC

## 2015-02-16 MED ORDER — OXYCODONE-ACETAMINOPHEN 10-325 MG PO TABS: 0.5000 | ORAL_TABLET | Freq: Three times a day (TID) | ORAL | Status: DC | PRN

## 2015-02-16 MED ORDER — ALENDRONATE SODIUM 70 MG PO TABS: ORAL_TABLET | ORAL | Status: DC

## 2015-02-16 MED ORDER — LISINOPRIL 20 MG PO TABS: 20.0000 mg | ORAL_TABLET | Freq: Every day | ORAL | Status: DC

## 2015-02-16 MED ORDER — ATORVASTATIN CALCIUM 20 MG PO TABS: 20.0000 mg | ORAL_TABLET | Freq: Every day | ORAL | Status: DC

## 2015-02-16 MED ORDER — BUDESONIDE-FORMOTEROL FUMARATE 80-4.5 MCG/ACT IN AERO: 2.0000 | INHALATION_SPRAY | Freq: Two times a day (BID) | RESPIRATORY_TRACT | Status: DC

## 2015-02-16 MED ORDER — VARENICLINE TARTRATE 0.5 MG X 11 & 1 MG X 42 PO MISC: ORAL | Status: DC

## 2015-02-16 NOTE — Progress Notes (Signed)
CC: Sue Smith is a 60 y.o. female is here for Medication Refill and Dx of Pre-Diabetes   Subjective: HPI:  Follow-up prediabetes: No outside blood sugars report. Denies polyuria prior visual polydipsia  Follow essential hypertension: Taking lisinopril 20% complaints. She is requesting refills. No outside blood pressures to report. She denies chest pain shortness of breath orthopnea or peripheral edema.  Follow-up COPD: She's found that Symbicort significantly reduces her cough if she takes 1 puff every other day. It's working tremendously better than using as needed albuterol.since she began the above regimen she is not having any wheezing.   She is requesting refills on oxycodone. She's taking this up to 3 times a day as needed for low back pain without any radicular symptoms. Symptoms are worse the more active she is. Symptoms are absent she takes a full tab every 8-12 hours. She denies any known side effects.  She restarted smoking over the winter while visiting her in-laws up [place]. She tells me that there was some much drama and stress that she had to start smoking to get a release. She wants to quit and wants to know if she can try Chantix.     Review Of Systems Outlined In HPI  Past Medical History  Diagnosis Date  . Cervical dysplasia     Past Surgical History  Procedure Laterality Date  . Abdominal hysterectomy    . Total abdominal hysterectomy w/ bilateral salpingoophorectomy    . Appendectomy     Family History  Problem Relation Age of Onset  . Cancer Mother   . Heart failure Father     Social History   Social History  . Marital Status: Widowed    Spouse Name: N/A  . Number of Children: N/A  . Years of Education: N/A   Occupational History  . Not on file.   Social History Main Topics  . Smoking status: Former Smoker    Quit date: 01/14/2010  . Smokeless tobacco: Not on file  . Alcohol Use: 1.0 oz/week    2 drink(s) per week  . Drug Use: No  .  Sexual Activity: Not on file   Other Topics Concern  . Not on file   Social History Narrative     Objective: BP 139/80 mmHg  Pulse 71  Wt 136 lb (61.689 kg)  General: Alert and Oriented, No Acute Distress HEENT: Pupils equal, round, reactive to light. Conjunctivae clear.  moist mucous membranes  Lungs: Clear to auscultation bilaterally, no wheezing/ronchi/rales.  Comfortable work of breathing. Good air movement. Cardiac: Regular rate and rhythm. Normal S1/S2.  No murmurs, rubs, nor gallops.   Extremities: No peripheral edema.  Strong peripheral pulses.  Mental Status: No depression, anxiety, nor agitation. Skin: Warm and dry.  Assessment & Plan: Sue Smith was seen today for medication refill and dx of pre-diabetes.  Diagnoses and all orders for this visit:  Pre-diabetes -     POCT HgB A1C  Essential hypertension -     lisinopril (PRINIVIL,ZESTRIL) 20 MG tablet; Take 1 tablet (20 mg total) by mouth daily.  COPD bronchitis  DDD (degenerative disc disease), lumbar -     oxyCODONE-acetaminophen (PERCOCET) 10-325 MG tablet; Take 0.5-1 tablets by mouth every 8 (eight) hours as needed for pain.  Tobacco use disorder -     varenicline (CHANTIX STARTING MONTH PAK) 0.5 MG X 11 & 1 MG X 42 tablet; Take one 0.5 mg tablet by mouth once daily for 3 days, then increase to one 0.5  mg tablet twice daily for 4 days, then increase to one 1 mg tablet twice daily. -     varenicline (CHANTIX CONTINUING MONTH PAK) 1 MG tablet; Take 1 tablet (1 mg total) by mouth 2 (two) times daily.  Other orders -     alendronate (FOSAMAX) 70 MG tablet; TAKE ONE TABLET BY MOUTH ONCE A WEEK, TAKE WITH A FULL GLASS OF WATER ON AN EMPTY STOMACH -     budesonide-formoterol (SYMBICORT) 80-4.5 MCG/ACT inhaler; Inhale 2 puffs into the lungs 2 (two) times daily. -     atorvastatin (LIPITOR) 20 MG tablet; Take 1 tablet (20 mg total) by mouth daily.   Prediabetes with an A1c of 5.7, controlled   essential hypertension:  Controlled with lisinopril COPD: Controlled with Symbicort Degenerative disc disease of the lumbar spine controlled with as needed use of Percocet Tobacco use: Discussed picking a quit date and starting Chantix 1 week beforehand.  Return in about 3 months (around 05/16/2015).

## 2015-03-14 ENCOUNTER — Telehealth: Payer: Self-pay | Admitting: Family Medicine

## 2015-03-14 DIAGNOSIS — M51369 Other intervertebral disc degeneration, lumbar region without mention of lumbar back pain or lower extremity pain: Secondary | ICD-10-CM

## 2015-03-14 DIAGNOSIS — M5136 Other intervertebral disc degeneration, lumbar region: Secondary | ICD-10-CM

## 2015-03-15 MED ORDER — OXYCODONE-ACETAMINOPHEN 10-325 MG PO TABS: 0.5000 | ORAL_TABLET | Freq: Three times a day (TID) | ORAL | Status: DC | PRN

## 2015-03-15 NOTE — Addendum Note (Signed)
Addended by: Marcial Pacas on: 03/15/2015 01:30 PM   Modules accepted: Orders

## 2015-03-15 NOTE — Telephone Encounter (Signed)
[place], Rx placed in in-box ready for pickup/faxing.  

## 2015-03-15 NOTE — Telephone Encounter (Signed)
vm left for pt letting her that her Rx is available for pick up

## 2015-04-12 ENCOUNTER — Other Ambulatory Visit: Payer: Self-pay | Admitting: Family Medicine

## 2015-04-12 ENCOUNTER — Encounter: Payer: Self-pay | Admitting: Family Medicine

## 2015-04-12 DIAGNOSIS — M5136 Other intervertebral disc degeneration, lumbar region: Secondary | ICD-10-CM

## 2015-04-13 MED ORDER — OXYCODONE-ACETAMINOPHEN 10-325 MG PO TABS: 0.5000 | ORAL_TABLET | Freq: Three times a day (TID) | ORAL | Status: DC | PRN

## 2015-04-13 NOTE — Telephone Encounter (Signed)
Sue Smith, Rx placed in in-box ready for pickup/faxing.  

## 2015-04-13 NOTE — Telephone Encounter (Signed)
Pt.notified

## 2015-04-17 ENCOUNTER — Other Ambulatory Visit: Payer: Self-pay | Admitting: Family Medicine

## 2015-05-11 ENCOUNTER — Other Ambulatory Visit: Payer: Self-pay | Admitting: Family Medicine

## 2015-05-11 DIAGNOSIS — M5136 Other intervertebral disc degeneration, lumbar region: Secondary | ICD-10-CM

## 2015-05-15 ENCOUNTER — Other Ambulatory Visit: Payer: Self-pay | Admitting: Family Medicine

## 2015-05-15 DIAGNOSIS — M5136 Other intervertebral disc degeneration, lumbar region: Secondary | ICD-10-CM

## 2015-05-15 MED ORDER — OXYCODONE-ACETAMINOPHEN 10-325 MG PO TABS: 0.5000 | ORAL_TABLET | Freq: Three times a day (TID) | ORAL | Status: DC | PRN

## 2015-05-15 NOTE — Addendum Note (Signed)
Addended by: Marcial Pacas on: 05/15/2015 02:11 PM   Modules accepted: Orders

## 2015-05-16 NOTE — Addendum Note (Signed)
Addended by: Marcial Pacas on: 05/16/2015 03:42 PM   Modules accepted: Orders

## 2015-05-17 ENCOUNTER — Other Ambulatory Visit: Payer: Self-pay | Admitting: Family Medicine

## 2015-05-23 ENCOUNTER — Other Ambulatory Visit: Payer: Self-pay | Admitting: Family Medicine

## 2015-06-13 ENCOUNTER — Ambulatory Visit (INDEPENDENT_AMBULATORY_CARE_PROVIDER_SITE_OTHER): Payer: BLUE CROSS/BLUE SHIELD | Admitting: Family Medicine

## 2015-06-13 ENCOUNTER — Encounter: Payer: Self-pay | Admitting: Family Medicine

## 2015-06-13 VITALS — BP 164/84 | HR 56 | Wt 140.0 lb

## 2015-06-13 DIAGNOSIS — IMO0001 Reserved for inherently not codable concepts without codable children: Secondary | ICD-10-CM

## 2015-06-13 DIAGNOSIS — R232 Flushing: Secondary | ICD-10-CM | POA: Diagnosis not present

## 2015-06-13 DIAGNOSIS — F4310 Post-traumatic stress disorder, unspecified: Secondary | ICD-10-CM

## 2015-06-13 DIAGNOSIS — I10 Essential (primary) hypertension: Secondary | ICD-10-CM | POA: Diagnosis not present

## 2015-06-13 DIAGNOSIS — J449 Chronic obstructive pulmonary disease, unspecified: Secondary | ICD-10-CM

## 2015-06-13 DIAGNOSIS — R7303 Prediabetes: Secondary | ICD-10-CM

## 2015-06-13 DIAGNOSIS — M5136 Other intervertebral disc degeneration, lumbar region: Secondary | ICD-10-CM

## 2015-06-13 LAB — POCT GLYCOSYLATED HEMOGLOBIN (HGB A1C): Hemoglobin A1C: 5.3

## 2015-06-13 MED ORDER — DOXEPIN HCL 25 MG PO CAPS: 25.0000 mg | ORAL_CAPSULE | Freq: Every day | ORAL | Status: DC

## 2015-06-13 MED ORDER — OXYCODONE-ACETAMINOPHEN 10-325 MG PO TABS: 0.5000 | ORAL_TABLET | Freq: Three times a day (TID) | ORAL | Status: DC | PRN

## 2015-06-13 MED ORDER — ESTRADIOL 1 MG PO TABS: 1.0000 mg | ORAL_TABLET | Freq: Every day | ORAL | Status: DC
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
## 2015-07-03 ENCOUNTER — Encounter: Payer: Self-pay | Admitting: Family Medicine

## 2015-07-03 MED ORDER — DIAZEPAM 5 MG PO TABS: 2.5000 mg | ORAL_TABLET | Freq: Every evening | ORAL | Status: DC | PRN

## 2015-07-10 ENCOUNTER — Telehealth: Payer: Self-pay | Admitting: Family Medicine

## 2015-07-10 ENCOUNTER — Encounter: Payer: Self-pay | Admitting: Family Medicine

## 2015-07-10 DIAGNOSIS — M5136 Other intervertebral disc degeneration, lumbar region: Secondary | ICD-10-CM

## 2015-07-12 MED ORDER — OXYCODONE-ACETAMINOPHEN 10-325 MG PO TABS: 0.5000 | ORAL_TABLET | Freq: Three times a day (TID) | ORAL | Status: DC | PRN

## 2015-07-12 NOTE — Addendum Note (Signed)
Addended by: Marcial Pacas on: 07/12/2015 04:26 PM   Modules accepted: Orders

## 2015-07-12 NOTE — Telephone Encounter (Signed)
Pt.notified

## 2015-07-12 NOTE — Telephone Encounter (Signed)
[place], Rx placed in in-box ready for pickup/faxing.  

## 2015-07-26 ENCOUNTER — Encounter: Payer: Self-pay | Admitting: Family Medicine

## 2015-08-07 ENCOUNTER — Other Ambulatory Visit: Payer: Self-pay | Admitting: Family Medicine

## 2015-08-07 DIAGNOSIS — M5136 Other intervertebral disc degeneration, lumbar region: Secondary | ICD-10-CM

## 2015-08-08 ENCOUNTER — Other Ambulatory Visit: Payer: Self-pay

## 2015-08-08 DIAGNOSIS — M51369 Other intervertebral disc degeneration, lumbar region without mention of lumbar back pain or lower extremity pain: Secondary | ICD-10-CM

## 2015-08-08 DIAGNOSIS — M5136 Other intervertebral disc degeneration, lumbar region: Secondary | ICD-10-CM

## 2015-08-08 MED ORDER — OXYCODONE-ACETAMINOPHEN 10-325 MG PO TABS: 0.5000 | ORAL_TABLET | Freq: Three times a day (TID) | ORAL | 0 refills | Status: DC | PRN

## 2015-08-12 ENCOUNTER — Other Ambulatory Visit: Payer: Self-pay | Admitting: Family Medicine

## 2015-08-15 MED ORDER — DIAZEPAM 5 MG PO TABS: 2.5000 mg | ORAL_TABLET | Freq: Every evening | ORAL | 0 refills | Status: DC | PRN

## 2015-09-04 ENCOUNTER — Encounter: Payer: Self-pay | Admitting: Family Medicine

## 2015-09-04 ENCOUNTER — Ambulatory Visit (INDEPENDENT_AMBULATORY_CARE_PROVIDER_SITE_OTHER): Payer: BLUE CROSS/BLUE SHIELD | Admitting: Family Medicine

## 2015-09-04 VITALS — BP 179/85 | HR 59 | Wt 140.0 lb

## 2015-09-04 DIAGNOSIS — R7303 Prediabetes: Secondary | ICD-10-CM | POA: Diagnosis not present

## 2015-09-04 DIAGNOSIS — M5136 Other intervertebral disc degeneration, lumbar region: Secondary | ICD-10-CM | POA: Diagnosis not present

## 2015-09-04 DIAGNOSIS — I10 Essential (primary) hypertension: Secondary | ICD-10-CM | POA: Diagnosis not present

## 2015-09-04 LAB — POCT GLYCOSYLATED HEMOGLOBIN (HGB A1C): Hemoglobin A1C: 5.5

## 2015-09-04 MED ORDER — LISINOPRIL-HYDROCHLOROTHIAZIDE 20-12.5 MG PO TABS: 1.0000 | ORAL_TABLET | Freq: Every day | ORAL | 3 refills | Status: DC

## 2015-09-04 MED ORDER — OXYCODONE-ACETAMINOPHEN 10-325 MG PO TABS: 0.5000 | ORAL_TABLET | Freq: Four times a day (QID) | ORAL | 0 refills | Status: DC | PRN

## 2015-09-04 MED ORDER — BUDESONIDE-FORMOTEROL FUMARATE 80-4.5 MCG/ACT IN AERO: 2.0000 | INHALATION_SPRAY | Freq: Two times a day (BID) | RESPIRATORY_TRACT | 3 refills | Status: DC

## 2015-09-04 NOTE — Progress Notes (Signed)
CC: Sue Smith is a 60 y.o. female is here for Hyperglycemia and Medication Refill   Subjective: HPI:  Follow-up essential hypertension: She's been checking her blood pressure at home and is not uncommon for her to have readings in the stage I hypertension range. She denies any chest pain shortness of breath orthopnea nor peripheral edema. She's taking the lisinopril with 100% compliance.  Follow-up prediabetes: She is trying to stay active by working in her garden most days out of the week. She's trying to watch her carbohydrate intake. She denies any polyuria polyphagia or vision disturbance.  Her only complaint today is persistent low back pain. It's similar in character for her chronic back pain however Percocet 10 mg does not seem to be lasting more than 5 hours at most. During his 5 hour she is almost 100% pain-free and does not interfere with quality of life.  She denies any radicular component to her pain or any motor or sensory disturbances in the lower extremities   Review Of Systems Outlined In HPI  Past Medical History:  Diagnosis Date  . Cervical dysplasia     Past Surgical History:  Procedure Laterality Date  . ABDOMINAL HYSTERECTOMY    . APPENDECTOMY    . TOTAL ABDOMINAL HYSTERECTOMY W/ BILATERAL SALPINGOOPHORECTOMY     Family History  Problem Relation Age of Onset  . Cancer Mother   . Heart failure Father     Social History   Social History  . Marital status: Widowed    Spouse name: N/A  . Number of children: N/A  . Years of education: N/A   Occupational History  . Not on file.   Social History Main Topics  . Smoking status: Former Smoker    Quit date: 01/14/2010  . Smokeless tobacco: Not on file  . Alcohol use 1.0 oz/week    2 drink(s) per week  . Drug use: No  . Sexual activity: Not on file   Other Topics Concern  . Not on file   Social History Narrative  . No narrative on file     Objective: BP (!) 179/85   Pulse (!) 59   Wt 140 lb  (63.5 kg)   BMI 21.93 kg/m   General: Alert and Oriented, No Acute Distress HEENT: Pupils equal, round, reactive to light. Conjunctivae clear. Moist mucous membranes  Lungs: Clear to auscultation bilaterally, no wheezing/ronchi/rales.  Comfortable work of breathing. Good air movement. Cardiac: Regular rate and rhythm. Normal S1/S2.  No murmurs, rubs, nor gallops.   Extremities: No peripheral edema.  Strong peripheral pulses.  Mental Status: No depression, anxiety, nor agitation. Skin: Warm and dry. Back: No midline spinous process tenderness in the lumbar region  Assessment & Plan: Dulse was seen today for hyperglycemia and medication refill.  Diagnoses and all orders for this visit:  Essential hypertension  Pre-diabetes  DDD (degenerative disc disease), lumbar -     oxyCODONE-acetaminophen (PERCOCET) 10-325 MG tablet; Take 0.5-1 tablets by mouth every 6 (six) hours as needed for pain.  Other orders -     lisinopril-hydrochlorothiazide (ZESTORETIC) 20-12.5 MG tablet; Take 1 tablet by mouth daily. -     budesonide-formoterol (SYMBICORT) 80-4.5 MCG/ACT inhaler; Inhale 2 puffs into the lungs 2 (two) times daily.   Essential hypertension: Uncontrolled chronic condition adding hydrochlorothiazide to her lisinopril Prediabetes: A1c of 5.5, controlled, continue daily activity and carbohydrate restriction Degenerative disc disease: Increasing frequency allowance of Percocet.  Discussed with this patient that I will be resigning from  my position here with [employer] in September in order to stay with my family who will be moving to [facility]. I let him know about the providers that are still accepting patients and I feel that this individual will be under great care if he/she stays here with [facility].   Return in about 3 months (around 12/05/2015) for Physical.

## 2015-09-04 NOTE — Addendum Note (Signed)
Addended by: Delrae Alfred on: 09/04/2015 08:54 AM   Modules accepted: Orders

## 2015-09-05 ENCOUNTER — Encounter: Payer: Self-pay | Admitting: Family Medicine

## 2015-09-10 ENCOUNTER — Encounter: Payer: Self-pay | Admitting: Family Medicine

## 2015-09-17 ENCOUNTER — Other Ambulatory Visit: Payer: Self-pay | Admitting: Family Medicine

## 2015-09-17 DIAGNOSIS — I10 Essential (primary) hypertension: Secondary | ICD-10-CM

## 2015-10-02 ENCOUNTER — Other Ambulatory Visit: Payer: Self-pay | Admitting: Family Medicine

## 2015-10-02 DIAGNOSIS — M5136 Other intervertebral disc degeneration, lumbar region: Secondary | ICD-10-CM

## 2015-10-04 ENCOUNTER — Other Ambulatory Visit: Payer: Self-pay | Admitting: Physician Assistant

## 2015-10-04 ENCOUNTER — Encounter: Payer: Self-pay | Admitting: Physician Assistant

## 2015-10-04 DIAGNOSIS — M5136 Other intervertebral disc degeneration, lumbar region: Secondary | ICD-10-CM

## 2015-10-04 MED ORDER — OXYCODONE-ACETAMINOPHEN 10-325 MG PO TABS: 0.5000 | ORAL_TABLET | Freq: Four times a day (QID) | ORAL | 0 refills | Status: DC | PRN

## 2015-10-04 NOTE — Telephone Encounter (Signed)
Pt needs an appointment with me for further medicines

## 2015-10-04 NOTE — Progress Notes (Signed)
Pt is a hommel patient and I have not seen yet. She is taking for  DDD, lumbar.

## 2015-10-04 NOTE — Telephone Encounter (Signed)
Please call pt. She needs an appointment with Me, Luvenia Starch, or Dr Sheppard Coil to continue this medicine.  I will not prescribe chronic pain medications at this dose. I am doubtful that Dr Sheppard Coil will as well. I am happy to refer to pain management.

## 2015-10-11 ENCOUNTER — Ambulatory Visit (INDEPENDENT_AMBULATORY_CARE_PROVIDER_SITE_OTHER): Payer: BLUE CROSS/BLUE SHIELD | Admitting: Physician Assistant

## 2015-10-11 ENCOUNTER — Encounter: Payer: Self-pay | Admitting: Physician Assistant

## 2015-10-11 VITALS — BP 153/63 | HR 61 | Ht 67.0 in | Wt 141.0 lb

## 2015-10-11 DIAGNOSIS — M5136 Other intervertebral disc degeneration, lumbar region: Secondary | ICD-10-CM | POA: Diagnosis not present

## 2015-10-11 DIAGNOSIS — Z23 Encounter for immunization: Secondary | ICD-10-CM

## 2015-10-11 DIAGNOSIS — I10 Essential (primary) hypertension: Secondary | ICD-10-CM | POA: Diagnosis not present

## 2015-10-11 MED ORDER — OXYCODONE-ACETAMINOPHEN 10-325 MG PO TABS: 0.5000 | ORAL_TABLET | Freq: Four times a day (QID) | ORAL | 0 refills | Status: DC | PRN

## 2015-10-11 MED ORDER — AMLODIPINE BESYLATE 2.5 MG PO TABS: ORAL_TABLET | ORAL | 1 refills | Status: DC

## 2015-10-11 NOTE — Progress Notes (Addendum)
Subjective:     Patient ID: Sue Smith, female   DOB: 03/12/1955, 60 y.o.   MRN: [id number]  HPI Patient is a 60 y.o. Caucasian female presenting today to establish care from Dr. Ileene Rubens and for medication refills. The patient notes that she was taking lisinopril-HCTZ and had a severe reaction with mouth sores and stomach upset. The patient has not been taking this medication and reports that her systolic blood pressure is in the 130s at home. The patient is also requesting a medication refill for her Oxycodone. The patient reports that she has been taking this for the past 3 years for her degenerative disc disease. The patient denies shortness of breath, palpitations, chest pain, fever, or weight change.  Review of Systems  Constitutional: Negative for activity change, appetite change, chills, diaphoresis, fatigue, fever and unexpected weight change.  HENT: Negative.   Eyes: Negative.   Respiratory: Negative for cough, chest tightness, shortness of breath and wheezing.   Cardiovascular: Negative for chest pain, palpitations and leg swelling.  Gastrointestinal: Negative.   Genitourinary: Negative.   Musculoskeletal: Positive for back pain.  Allergic/Immunologic: Positive for environmental allergies.  Neurological: Negative for dizziness, syncope, weakness, light-headedness, numbness and headaches.  Hematological: Negative.   Psychiatric/Behavioral: Negative.       Objective:   Physical Exam  Constitutional: She is oriented to person, place, and time. She appears well-developed and well-nourished. No distress.  HENT:  Head: Normocephalic and atraumatic.  Right Ear: External ear normal.  Left Ear: External ear normal.  Nose: Nose normal.  Mouth/Throat: Oropharynx is clear and moist. No oropharyngeal exudate.  Cerumen impaction noted bilaterally.   Eyes: Conjunctivae and EOM are normal. Pupils are equal, round, and reactive to light. Right eye exhibits no discharge. Left eye  exhibits no discharge. No scleral icterus.  Neck: Normal range of motion. Neck supple. No JVD present. No tracheal deviation present. No thyromegaly present.  Cardiovascular: Normal rate, regular rhythm, normal heart sounds and intact distal pulses.  Exam reveals no gallop and no friction rub.   No murmur heard. Pulmonary/Chest: Effort normal and breath sounds normal. No stridor. No respiratory distress. She has no wheezes. She has no rales. She exhibits no tenderness.  Abdominal: Soft. Bowel sounds are normal. She exhibits no distension and no mass. There is no tenderness. There is no rebound and no guarding.  Lymphadenopathy:    She has no cervical adenopathy.  Neurological: She is alert and oriented to person, place, and time. No cranial nerve deficit. Coordination normal.  Skin: Skin is warm and dry. No rash noted. She is not diaphoretic. No erythema. No pallor.  Psychiatric: She has a normal mood and affect. Her behavior is normal. Judgment and thought content normal.      Assessment:     Diagnoses and all orders for this visit:  Essential hypertension -     amLODipine (NORVASC) 2.5 MG tablet; Take one-two tablets once daily.  Influenza vaccine needed -     Flu Vaccine QUAD 36+ mos PF IM (Fluarix & Fluzone Quad PF)  DDD (degenerative disc disease), lumbar -     Discontinue: oxyCODONE-acetaminophen (PERCOCET) 10-325 MG tablet; Take 0.5-1 tablets by mouth every 6 (six) hours as needed for pain. -     oxyCODONE-acetaminophen (PERCOCET) 10-325 MG tablet; Take 0.5-1 tablets by mouth every 6 (six) hours as needed for pain. -     oxyCODONE-acetaminophen (PERCOCET) 10-325 MG tablet; Take 0.5-1 tablets by mouth every 6 (six) hours as needed for pain.  Do not refill until 11/10/2015.       Plan:     1. Hypertension - Patient to discontinue lisinopril-HCTZ at this time. Patient instructed to increase dose of lisinopril to 40mg  daily and if that does not improve her blood pressure 3-4 days to  add amlodipine 2.5mg  two tablets daily. Patient encouraged to keep blood pressure log at home and to bring it with her at her next appointment in 2 weeks for nurse visit. Will continue to monitor.   2. Degenerative disc disease - Patient with chronic daily pain secondary to DDD. Patient given refill of percocet today. Patient is aware that she should not take any other substances with acetaminophen in it on the days that she takes her percocet. Patient receptive to possible injections and further evaluation in the future. Will do opioid pain contract during next appointment.   Summary - Patient given flu shot in-clinic today.

## 2015-10-18 ENCOUNTER — Other Ambulatory Visit: Payer: Self-pay | Admitting: Family Medicine

## 2015-11-13 ENCOUNTER — Encounter: Payer: Self-pay | Admitting: Physician Assistant

## 2015-11-15 ENCOUNTER — Encounter: Payer: Self-pay | Admitting: Physician Assistant

## 2015-11-16 ENCOUNTER — Other Ambulatory Visit: Payer: Self-pay | Admitting: Physician Assistant

## 2015-11-16 MED ORDER — LISINOPRIL 40 MG PO TABS
40.0000 mg | ORAL_TABLET | Freq: Every day | ORAL | 3 refills | Status: DC
Start: 2015-11-16 — End: 2015-12-01

## 2015-12-01 ENCOUNTER — Other Ambulatory Visit: Payer: Self-pay | Admitting: Physician Assistant

## 2015-12-01 ENCOUNTER — Ambulatory Visit (INDEPENDENT_AMBULATORY_CARE_PROVIDER_SITE_OTHER): Payer: BLUE CROSS/BLUE SHIELD | Admitting: Physician Assistant

## 2015-12-01 ENCOUNTER — Encounter: Payer: Self-pay | Admitting: Physician Assistant

## 2015-12-01 VITALS — BP 111/62 | HR 61 | Ht 67.0 in | Wt 140.0 lb

## 2015-12-01 DIAGNOSIS — Z131 Encounter for screening for diabetes mellitus: Secondary | ICD-10-CM

## 2015-12-01 DIAGNOSIS — I10 Essential (primary) hypertension: Secondary | ICD-10-CM

## 2015-12-01 DIAGNOSIS — M5136 Other intervertebral disc degeneration, lumbar region: Secondary | ICD-10-CM

## 2015-12-01 DIAGNOSIS — Z0000 Encounter for general adult medical examination without abnormal findings: Secondary | ICD-10-CM | POA: Diagnosis not present

## 2015-12-01 DIAGNOSIS — Z1231 Encounter for screening mammogram for malignant neoplasm of breast: Secondary | ICD-10-CM

## 2015-12-01 DIAGNOSIS — J0100 Acute maxillary sinusitis, unspecified: Secondary | ICD-10-CM

## 2015-12-01 DIAGNOSIS — E785 Hyperlipidemia, unspecified: Secondary | ICD-10-CM

## 2015-12-01 DIAGNOSIS — N959 Unspecified menopausal and perimenopausal disorder: Secondary | ICD-10-CM

## 2015-12-01 DIAGNOSIS — Z1159 Encounter for screening for other viral diseases: Secondary | ICD-10-CM | POA: Diagnosis not present

## 2015-12-01 DIAGNOSIS — K219 Gastro-esophageal reflux disease without esophagitis: Secondary | ICD-10-CM

## 2015-12-01 LAB — LIPID PANEL
CHOLESTEROL: 161 mg/dL (ref <200)
HDL: 51 mg/dL (ref >50)
LDL CALC: 85 mg/dL (ref <100)
Total CHOL/HDL Ratio: 3.2 Ratio (ref <5.0)
Triglycerides: 127 mg/dL (ref <150)
VLDL: 25 mg/dL (ref <30)

## 2015-12-01 LAB — COMPLETE METABOLIC PANEL WITH GFR
ALT: 10 U/L (ref 6–29)
AST: 10 U/L (ref 10–35)
Albumin: 4.6 g/dL (ref 3.6–5.1)
Alkaline Phosphatase: 59 U/L (ref 33–130)
BUN: 11 mg/dL (ref 7–25)
CO2: 29 mmol/L (ref 20–31)
CREATININE: 1.04 mg/dL (ref 0.50–1.05)
Calcium: 10 mg/dL (ref 8.6–10.4)
Chloride: 104 mmol/L (ref 98–110)
GFR, Est African American: 68 mL/min (ref >=60)
GFR, Est Non African American: 59 mL/min — ABNORMAL LOW (ref >=60)
GLUCOSE: 96 mg/dL (ref 65–99)
Potassium: 4 mmol/L (ref 3.5–5.3)
SODIUM: 141 mmol/L (ref 135–146)
Total Bilirubin: 0.5 mg/dL (ref 0.2–1.2)
Total Protein: 7.4 g/dL (ref 6.1–8.1)

## 2015-12-01 MED ORDER — LISINOPRIL 40 MG PO TABS: 40.0000 mg | ORAL_TABLET | Freq: Every day | ORAL | 3 refills | Status: DC

## 2015-12-01 MED ORDER — LEVOFLOXACIN 500 MG PO TABS: 500.0000 mg | ORAL_TABLET | Freq: Every day | ORAL | 0 refills | Status: DC

## 2015-12-01 MED ORDER — ESTRADIOL 1 MG PO TABS: 1.0000 mg | ORAL_TABLET | Freq: Every day | ORAL | 1 refills | Status: DC

## 2015-12-01 MED ORDER — OXYCODONE-ACETAMINOPHEN 10-325 MG PO TABS: 0.5000 | ORAL_TABLET | Freq: Four times a day (QID) | ORAL | 0 refills | Status: DC | PRN

## 2015-12-01 MED ORDER — PREDNISONE 50 MG PO TABS: ORAL_TABLET | ORAL | 0 refills | Status: DC

## 2015-12-01 MED ORDER — RANITIDINE HCL 150 MG PO TABS: 150.0000 mg | ORAL_TABLET | Freq: Two times a day (BID) | ORAL | 1 refills | Status: DC

## 2015-12-01 NOTE — Progress Notes (Signed)
Subjective:     Sue Smith is a 60 y.o. female and is here for a comprehensive physical exam. The patient reports problems - she is having 2 weeks of sinus pressure, headaches, teeth pain and ear pressure. she is taking OTC sudafed and mucinex without relief. no fever, chills, or body aches. .  Brings in BP logs and averaging 100-130/70's. She denies any CP, palpitations, headaches or dizziness. She denies any side effects to medications.  Social History   Social History  . Marital status: Widowed    Spouse name: N/A  . Number of children: N/A  . Years of education: N/A   Occupational History  . Not on file.   Social History Main Topics  . Smoking status: Former Smoker    Quit date: 01/14/2010  . Smokeless tobacco: Not on file  . Alcohol use 1.0 oz/week    2 drink(s) per week  . Drug use: No  . Sexual activity: Not on file   Other Topics Concern  . Not on file   Social History Narrative  . No narrative on file   Health Maintenance  Topic Date Due  . Hepatitis C Screening  09/09/1955  . HIV Screening  07/26/1972  . PAP SMEAR  11/30/2045 (Originally 07/27/1978)  . MAMMOGRAM  01/04/2017  . TETANUS/TDAP  01/14/2017  . COLONOSCOPY  07/01/2022  . INFLUENZA VACCINE  Completed    The following portions of the patient's history were reviewed and updated as appropriate: allergies, current medications, past family history, past medical history, past social history, past surgical history and problem list.  Review of Systems Pertinent items are noted in HPI. Pertinent items noted in HPI and remainder of comprehensive ROS otherwise negative.   Objective:    BP 111/62   Pulse 61   Ht 5\' 7"  (1.702 m)   Wt 140 lb (63.5 kg)   BMI 21.93 kg/m  General appearance: alert, cooperative and appears stated age Head: Normocephalic, without obvious abnormality, atraumatic Eyes: conjunctivae/corneas clear. PERRL, EOM's intact. Fundi benign. Ears: normal TM's and external ear canals  both ears Nose: Nares normal. Septum midline. Mucosa normal. No drainage or sinus tenderness. Throat: lips, mucosa, and tongue normal; teeth and gums normal Neck: no adenopathy, no carotid bruit, no JVD, supple, symmetrical, trachea midline and thyroid not enlarged, symmetric, no tenderness/mass/nodules Back: symmetric, no curvature. ROM normal. No CVA tenderness. Lungs: clear to auscultation bilaterally Heart: regular rate and rhythm, S1, S2 normal, no murmur, click, rub or gallop Abdomen: soft, non-tender; bowel sounds normal; no masses,  no organomegaly Extremities: extremities normal, atraumatic, no cyanosis or edema Pulses: 2+ and symmetric Skin: Skin color, texture, turgor normal. No rashes or lesions Lymph nodes: Cervical, supraclavicular, and axillary nodes normal. Neurologic: Alert and oriented X 3, normal strength and tone. Normal symmetric reflexes. Normal coordination and gait    Assessment:    Healthy female exam.      Plan:    Marland KitchenMarland KitchenRoxane was seen today for annual exam.  Diagnoses and all orders for this visit:  Routine physical examination -     Hepatitis C Antibody -     Lipid panel -     COMPLETE METABOLIC PANEL WITH GFR  Need for hepatitis C screening test -     Hepatitis C Antibody  Hyperlipidemia, unspecified hyperlipidemia type -     Lipid panel  Screening for diabetes mellitus -     COMPLETE METABOLIC PANEL WITH GFR  DDD (degenerative disc disease), lumbar -  oxyCODONE-acetaminophen (PERCOCET) 10-325 MG tablet; Take 0.5-1 tablets by mouth every 6 (six) hours as needed for pain. Do not refill until 12/11/2015. -     Drug Abuse Panel 10-50 No Conf, U  Acute maxillary sinusitis, recurrence not specified -     levofloxacin (LEVAQUIN) 500 MG tablet; Take 1 tablet (500 mg total) by mouth daily. -     predniSONE (DELTASONE) 50 MG tablet; Take one tablet for 5 days.  Gastroesophageal reflux disease, esophagitis presence not specified -     ranitidine  (ZANTAC) 150 MG tablet; Take 1 tablet (150 mg total) by mouth 2 (two) times daily.  Essential hypertension, benign -     lisinopril (PRINIVIL,ZESTRIL) 40 MG tablet; Take 1 tablet (40 mg total) by mouth daily.  Post menopausal problems -     estradiol (ESTRACE) 1 MG tablet; Take 1 tablet (1 mg total) by mouth daily.  colonoscopy up to date.  Mammogram to have done in December.  Discussed vitamin d and calcium    Pt has a lot of allergies levaquin was the last abx given.   Discussed tapering off of estradiol. She is on board with taking 1/2 tablet daily. Follow up in 3 months.   Pt has been getting pain rx filled with Dr. Ileene Rubens for many years. She request rx today. UDS done today. Pt placed on pain contract.  See After Visit Summary for Counseling Recommendations

## 2015-12-01 NOTE — Patient Instructions (Signed)
Drug holidays.

## 2015-12-02 LAB — HEPATITIS C ANTIBODY: HCV Ab: NEGATIVE

## 2015-12-08 LAB — DRUG ABUSE PANEL 10-50 NO CONF, U
AMPHETAMINES (1000 ng/mL SCRN): NEGATIVE
BARBITURATES: NEGATIVE
BENZODIAZEPINES: POSITIVE — AB
COCAINE METABOLITES: NEGATIVE
MARIJUANA MET (50 NG/ML SCRN): NEGATIVE
METHADONE: NEGATIVE
METHAQUALONE: NEGATIVE
OPIATES: POSITIVE — AB
PHENCYCLIDINE: NEGATIVE
PROPOXYPHENE: NEGATIVE

## 2015-12-10 ENCOUNTER — Encounter: Payer: Self-pay | Admitting: Physician Assistant

## 2016-01-08 ENCOUNTER — Other Ambulatory Visit: Payer: Self-pay | Admitting: Family Medicine

## 2016-01-08 ENCOUNTER — Other Ambulatory Visit: Payer: Self-pay | Admitting: Physician Assistant

## 2016-01-08 DIAGNOSIS — M5136 Other intervertebral disc degeneration, lumbar region: Secondary | ICD-10-CM

## 2016-01-11 ENCOUNTER — Encounter: Payer: Self-pay | Admitting: Physician Assistant

## 2016-01-11 DIAGNOSIS — M5136 Other intervertebral disc degeneration, lumbar region: Secondary | ICD-10-CM

## 2016-01-12 ENCOUNTER — Ambulatory Visit (INDEPENDENT_AMBULATORY_CARE_PROVIDER_SITE_OTHER): Payer: BLUE CROSS/BLUE SHIELD

## 2016-01-12 DIAGNOSIS — Z1231 Encounter for screening mammogram for malignant neoplasm of breast: Secondary | ICD-10-CM

## 2016-01-12 MED ORDER — DIAZEPAM 5 MG PO TABS: 2.5000 mg | ORAL_TABLET | Freq: Every evening | ORAL | 0 refills | Status: DC | PRN

## 2016-01-12 MED ORDER — OXYCODONE-ACETAMINOPHEN 10-325 MG PO TABS: 0.5000 | ORAL_TABLET | Freq: Four times a day (QID) | ORAL | 0 refills | Status: DC | PRN

## 2016-01-16 ENCOUNTER — Other Ambulatory Visit: Payer: Self-pay

## 2016-01-16 ENCOUNTER — Encounter: Payer: Self-pay | Admitting: Physician Assistant

## 2016-01-16 MED ORDER — BUDESONIDE-FORMOTEROL FUMARATE 80-4.5 MCG/ACT IN AERO: 2.0000 | INHALATION_SPRAY | Freq: Two times a day (BID) | RESPIRATORY_TRACT | 3 refills | Status: DC

## 2016-02-05 ENCOUNTER — Other Ambulatory Visit: Payer: Self-pay | Admitting: Physician Assistant

## 2016-02-05 MED ORDER — ALENDRONATE SODIUM 70 MG PO TABS: ORAL_TABLET | ORAL | 3 refills | Status: DC

## 2016-02-05 NOTE — Telephone Encounter (Signed)
Up to date DEXA and CPE 11/17.

## 2016-02-11 ENCOUNTER — Other Ambulatory Visit: Payer: Self-pay | Admitting: Sports Medicine

## 2016-02-11 ENCOUNTER — Encounter: Payer: Self-pay | Admitting: Physician Assistant

## 2016-02-11 DIAGNOSIS — M5136 Other intervertebral disc degeneration, lumbar region: Secondary | ICD-10-CM

## 2016-02-12 MED ORDER — OXYCODONE-ACETAMINOPHEN 10-325 MG PO TABS: 0.5000 | ORAL_TABLET | Freq: Four times a day (QID) | ORAL | 0 refills | Status: DC | PRN

## 2016-03-11 ENCOUNTER — Ambulatory Visit (INDEPENDENT_AMBULATORY_CARE_PROVIDER_SITE_OTHER): Payer: BLUE CROSS/BLUE SHIELD | Admitting: Physician Assistant

## 2016-03-11 ENCOUNTER — Encounter: Payer: Self-pay | Admitting: Physician Assistant

## 2016-03-11 VITALS — BP 158/81 | HR 66 | Ht 67.0 in | Wt 140.0 lb

## 2016-03-11 DIAGNOSIS — I10 Essential (primary) hypertension: Secondary | ICD-10-CM | POA: Diagnosis not present

## 2016-03-11 DIAGNOSIS — M5136 Other intervertebral disc degeneration, lumbar region: Secondary | ICD-10-CM | POA: Diagnosis not present

## 2016-03-11 DIAGNOSIS — R232 Flushing: Secondary | ICD-10-CM

## 2016-03-11 MED ORDER — OXYCODONE-ACETAMINOPHEN 10-325 MG PO TABS
0.5000 | ORAL_TABLET | Freq: Four times a day (QID) | ORAL | 0 refills | Status: DC | PRN
Start: 2016-03-11 — End: 2016-03-11

## 2016-03-11 MED ORDER — OXYCODONE-ACETAMINOPHEN 10-325 MG PO TABS: 0.5000 | ORAL_TABLET | Freq: Four times a day (QID) | ORAL | 0 refills | Status: DC | PRN

## 2016-03-11 NOTE — Patient Instructions (Signed)
Menopause and Herbal Products Introduction WHAT IS MENOPAUSE? Menopause is the normal time of life when menstrual periods decrease in frequency and eventually stop completely. This process can take several years for some women. Menopause is complete when you have had an absence of menstruation for a full year since your last menstrual period. It usually occurs between the ages of 35 and 90. It is not common for menopause to begin before the age of 45. During menopause, your body stops producing the female hormones estrogen and progesterone. Common symptoms associated with this loss of hormones (vasomotor symptoms) are:  Hot flashes.  Hot flushes.  Night sweats. Other common symptoms and complications of menopause include:  Decrease in sex drive.  Vaginal dryness and thinning of the walls of the vagina. This can make sex painful.  Dryness of the skin and development of wrinkles.  Headaches.  Tiredness.  Irritability.  Memory problems.  Weight gain.  Bladder infections.  Hair growth on the face and chest.  Inability to reproduce offspring (infertility).  Loss of density in the bones (osteoporosis) increasing your risk for breaks (fractures).  Depression.  Hardening and narrowing of the arteries (atherosclerosis). This increases your risk of heart attack and stroke. WHAT TREATMENT OPTIONS ARE AVAILABLE? There are many treatment choices for menopause symptoms. The most common treatment is hormone replacement therapy. Many alternative therapies for menopause are emerging, including the use of herbal products. These supplements can be found in the form of herbs, teas, oils, tinctures, and pills. Common herbal supplements for menopause are made from plants that contain phytoestrogens. Phytoestrogens are compounds that occur naturally in plants and plant products. They act like estrogen in the body. Foods and herbs that contain phytoestrogens include:  Soy.  Flax seeds.  Red  clover.  Ginseng. WHAT MENOPAUSE SYMPTOMS MAY BE HELPED IF I USE HERBAL PRODUCTS?  Vasomotor symptoms. These may be helped by:  Soy. Some studies show that soy may have a moderate benefit for hot flashes.  Black cohosh. There is limited evidence indicating this may be beneficial for hot flashes.  Symptoms that are related to heart and blood vessel disease. These may be helped by soy. Studies have shown that soy can help to lower cholesterol.  Depression. This may be helped by:  St. John's wort. There is limited evidence that shows this may help mild to moderate depression.  Black cohosh. There is evidence that this may help depression and mood swings.  Osteoporosis. Soy may help to decrease bone loss that is associated with menopause and may prevent osteoporosis. Limited evidence indicates that red clover may offer some bone loss protection as well. Other herbal products that are commonly used during menopause lack enough evidence to support their use as a replacement for conventional menopause therapies. These products include evening primrose, ginseng, and red clover. WHAT ARE THE CASES WHEN HERBAL PRODUCTS SHOULD NOT BE USED DURING MENOPAUSE? Do not use herbal products during menopause without your health care provider's approval if:  You are taking medicine.  You have a preexisting liver condition. ARE THERE ANY RISKS IN MY TAKING HERBAL PRODUCTS DURING MENOPAUSE? If you choose to use herbal products to help with symptoms of menopause, keep in mind that:  Different supplements have different and unmeasured amounts of herbal ingredients.  Herbal products are not regulated the same way that medicines are.  Concentrations of herbs may vary depending on the way they are prepared. For example, the concentration may be different in a pill, tea, oil, and  tincture.  Little is known about the risks of using herbal products, particularly the risks of long-term use.  Some herbal  supplements can be harmful when combined with certain medicines. Most commonly reported side effects of herbal products are mild. However, if used improperly, many herbal supplements can cause serious problems. Talk to your health care provider before starting any herbal product. If problems develop, stop taking the supplement and let your health care provider know. This information is not intended to replace advice given to you by your health care provider. Make sure you discuss any questions you have with your health care provider. Document Released: 06/19/2007 Document Revised: 06/08/2015 Document Reviewed: 06/15/2013  2017 [employer]

## 2016-03-11 NOTE — Progress Notes (Signed)
   Subjective:    Patient ID: Sue Smith, female    DOB: 06/12/1955, 61 y.o.   MRN: [id number]  HPI Pt is a 61 yo female who presents to the clinic for 3 month chronic pain follow up.   Chronic pain/lumbar pain- she has been well controlled on current dose for over 2 years. Previous patient of Dr. Ileene Rubens.   Vasomotor flushing- she stopped estradiol and no worsening of hot flashes but still present. She wonders about other treatment.   HTN- elevated today. Per pt checks at home and in the 130/80's. No CP, palpitaitons, headaches, dizziness. She is taking her medication daily.    Review of Systems  All other systems reviewed and are negative.      Objective:   Physical Exam  Constitutional: She is oriented to person, place, and time. She appears well-developed and well-nourished.  HENT:  Head: Normocephalic and atraumatic.  Neck: Normal range of motion. Neck supple. No thyromegaly present.  Cardiovascular: Normal rate, regular rhythm and normal heart sounds.   Pulmonary/Chest: Effort normal and breath sounds normal.  Lymphadenopathy:    She has no cervical adenopathy.  Neurological: She is alert and oriented to person, place, and time.  Psychiatric: She has a normal mood and affect. Her behavior is normal.          Assessment & Plan:  Marland KitchenMarland KitchenDiagnoses and all orders for this visit:  DDD (degenerative disc disease), lumbar -     Discontinue: oxyCODONE-acetaminophen (PERCOCET) 10-325 MG tablet; Take 0.5-1 tablets by mouth every 6 (six) hours as needed for pain. -     Discontinue: oxyCODONE-acetaminophen (PERCOCET) 10-325 MG tablet; Take 0.5-1 tablets by mouth every 6 (six) hours as needed for pain. Do not refill until 04/06/16. -     oxyCODONE-acetaminophen (PERCOCET) 10-325 MG tablet; Take 0.5-1 tablets by mouth every 6 (six) hours as needed for pain. Do not refill until 05/06/16.  Vasomotor flushing  Essential hypertension   [place] controlled substance database reviewed  with no concerns for any other provider prescribing and/or other narcotics.  opioid risk assessment tool low risk.  UDS done in November with no concerns.   For flushing discussed gabapentin which pt states she has tried. SSRI which she does not want to try. clonadine which she does not want to try. Gave HO for herbal options.   Keep BP log for next 2 weeks and come in for nurse visit recheck. If numbers have not improvemed then will need to change up BP medications.

## 2016-03-20 ENCOUNTER — Other Ambulatory Visit: Payer: Self-pay

## 2016-03-20 MED ORDER — ATORVASTATIN CALCIUM 20 MG PO TABS: 20.0000 mg | ORAL_TABLET | Freq: Every day | ORAL | 3 refills | Status: DC

## 2016-05-28 ENCOUNTER — Encounter: Payer: Self-pay | Admitting: Physician Assistant

## 2016-05-28 MED ORDER — LISINOPRIL 20 MG PO TABS: 40.0000 mg | ORAL_TABLET | Freq: Every day | ORAL | 0 refills | Status: DC

## 2016-06-05 ENCOUNTER — Ambulatory Visit (INDEPENDENT_AMBULATORY_CARE_PROVIDER_SITE_OTHER): Payer: BLUE CROSS/BLUE SHIELD | Admitting: Physician Assistant

## 2016-06-05 ENCOUNTER — Encounter: Payer: Self-pay | Admitting: Physician Assistant

## 2016-06-05 VITALS — BP 164/64 | HR 79 | Ht 67.0 in | Wt 136.0 lb

## 2016-06-05 DIAGNOSIS — M76891 Other specified enthesopathies of right lower limb, excluding foot: Secondary | ICD-10-CM

## 2016-06-05 DIAGNOSIS — M5136 Other intervertebral disc degeneration, lumbar region: Secondary | ICD-10-CM

## 2016-06-05 DIAGNOSIS — M7631 Iliotibial band syndrome, right leg: Secondary | ICD-10-CM

## 2016-06-05 DIAGNOSIS — Z716 Tobacco abuse counseling: Secondary | ICD-10-CM

## 2016-06-05 DIAGNOSIS — I10 Essential (primary) hypertension: Secondary | ICD-10-CM | POA: Diagnosis not present

## 2016-06-05 DIAGNOSIS — M25551 Pain in right hip: Secondary | ICD-10-CM | POA: Diagnosis not present

## 2016-06-05 DIAGNOSIS — Z720 Tobacco use: Secondary | ICD-10-CM

## 2016-06-05 MED ORDER — LISINOPRIL 20 MG PO TABS: 40.0000 mg | ORAL_TABLET | Freq: Every day | ORAL | 5 refills | Status: DC

## 2016-06-05 MED ORDER — OXYCODONE-ACETAMINOPHEN 10-325 MG PO TABS: 0.5000 | ORAL_TABLET | Freq: Four times a day (QID) | ORAL | 0 refills | Status: DC | PRN

## 2016-06-05 MED ORDER — VARENICLINE TARTRATE 0.5 MG X 11 & 1 MG X 42 PO MISC: ORAL | 0 refills | Status: DC

## 2016-06-05 MED ORDER — KETOROLAC TROMETHAMINE 60 MG/2ML IM SOLN
60.0000 mg | Freq: Once | INTRAMUSCULAR | Status: AC
  Administered 2016-06-05: 60 mg via INTRAMUSCULAR

## 2016-06-05 NOTE — Progress Notes (Signed)
   Subjective:    Patient ID: Sue Smith, female    DOB: 01-24-1955, 61 y.o.   MRN: [id number]  HPI  Pt is a 61 yo female who presents to the clinic for 3 month follow up on pain medication refill.  Pain is mostly under control however she does have some new pain with right hip. She was moving some furniture and felt it catch and has had pain ever since. She has not done anything to make better.   She would like to stop smoking.    Review of Systems  All other systems reviewed and are negative.      Objective:   Physical Exam  Constitutional: She is oriented to person, place, and time. She appears well-developed and well-nourished.  HENT:  Head: Normocephalic and atraumatic.  Cardiovascular: Normal rate, regular rhythm and normal heart sounds.   Pulmonary/Chest: Effort normal and breath sounds normal.  Musculoskeletal:  Right hip flexor tenderness.  No pain to palpation over greater trochanter.  Weakness to abductor muscles and pain over IT band insertion to palpation.   Neurological: She is alert and oriented to person, place, and time.  Psychiatric: She has a normal mood and affect. Her behavior is normal.          Assessment & Plan:  Marland KitchenMarland KitchenDiagnoses and all orders for this visit:  DDD (degenerative disc disease), lumbar -     Discontinue: oxyCODONE-acetaminophen (PERCOCET) 10-325 MG tablet; Take 0.5-1 tablets by mouth every 6 (six) hours as needed for pain. -     Discontinue: oxyCODONE-acetaminophen (PERCOCET) 10-325 MG tablet; Take 0.5-1 tablets by mouth every 6 (six) hours as needed for pain. Do not refill until 07/05/16 -     oxyCODONE-acetaminophen (PERCOCET) 10-325 MG tablet; Take 0.5-1 tablets by mouth every 6 (six) hours as needed for pain. Do not refill until 08/03/16  Right hip pain -     ketorolac (TORADOL) injection 60 mg; Inject 2 mLs (60 mg total) into the muscle once.  Essential hypertension -     lisinopril (PRINIVIL,ZESTRIL) 20 MG tablet; Take 2  tablets (40 mg total) by mouth daily.  Tendinitis of right hip flexor -     ketorolac (TORADOL) injection 60 mg; Inject 2 mLs (60 mg total) into the muscle once.  It band syndrome, right -     ketorolac (TORADOL) injection 60 mg; Inject 2 mLs (60 mg total) into the muscle once.  Encounter for smoking cessation counseling -     varenicline (CHANTIX STARTING MONTH PAK) 0.5 MG X 11 & 1 MG X 42 tablet; Take one 0.5mg  tablet by mouth once daily for 3 days, then increase to one 0.5mg  tablet twice daily for 3 days, then increase to one 1mg  tablet twice daily.  refilled pain medication for 3 months.  [place] database reviewed.  Pain contract signed.  UP to date UDS.   Discussed to stop smoking in next month. Follow up in one month to make sure not smoking and to get continuing chantix packs.   Suspect some injury during move to IT band and right hip flexor. Exercises given. Ice encouraged. toradol given today. Continue ibuprofen 800mg  up to three times a day. Follow up in one month.

## 2016-06-26 DIAGNOSIS — H5203 Hypermetropia, bilateral: Secondary | ICD-10-CM | POA: Diagnosis not present

## 2016-08-27 ENCOUNTER — Encounter: Payer: Self-pay | Admitting: Physician Assistant

## 2016-08-27 ENCOUNTER — Ambulatory Visit (INDEPENDENT_AMBULATORY_CARE_PROVIDER_SITE_OTHER): Payer: BLUE CROSS/BLUE SHIELD | Admitting: Physician Assistant

## 2016-08-27 VITALS — BP 142/86 | HR 57 | Ht 67.0 in | Wt 137.0 lb

## 2016-08-27 DIAGNOSIS — M549 Dorsalgia, unspecified: Secondary | ICD-10-CM | POA: Diagnosis not present

## 2016-08-27 DIAGNOSIS — G894 Chronic pain syndrome: Secondary | ICD-10-CM | POA: Diagnosis not present

## 2016-08-27 DIAGNOSIS — J410 Simple chronic bronchitis: Secondary | ICD-10-CM

## 2016-08-27 DIAGNOSIS — M5136 Other intervertebral disc degeneration, lumbar region: Secondary | ICD-10-CM | POA: Diagnosis not present

## 2016-08-27 MED ORDER — OXYCODONE-ACETAMINOPHEN 10-325 MG PO TABS: 0.5000 | ORAL_TABLET | Freq: Four times a day (QID) | ORAL | 0 refills | Status: DC | PRN

## 2016-08-27 MED ORDER — BUDESONIDE-FORMOTEROL FUMARATE 80-4.5 MCG/ACT IN AERO: 2.0000 | INHALATION_SPRAY | Freq: Two times a day (BID) | RESPIRATORY_TRACT | 3 refills | Status: DC

## 2016-08-27 NOTE — Progress Notes (Signed)
Subjective:    Patient ID: Sue Smith, female    DOB: 1955-07-12, 61 y.o.   MRN: [id number]  HPI  Pt is a 61 yo female who presents to the clinic for 3 month follow up for chronic pain medication. She is doing well on current dose and stable.   She does complain of new pain with left upper back. She has noticed off and on for a few months. Denies any neck pain or radiation down arm or into shoulder. Pain in left upper back and feels tight and burning. Worse after pulling weeds or working in the yard. Ibuprofen helps some. Not tried anything else.   COPD- went to pick up symbicort and going to be very expensive. Needs new coupon card and refill.   .. Active Ambulatory Problems    Diagnosis Date Noted  . Essential hypertension 11/04/2011  . Surgical menopause 11/04/2011  . Vasomotor flushing 11/18/2011  . Pre-diabetes 11/20/2011  . Personal history of colonic polyps 03/26/2012  . Osteoporosis 11/25/2012  . Hyperlipidemia 11/25/2012  . DDD (degenerative disc disease), lumbar 03/23/2013  . Insomnia 11/25/2013  . COPD bronchitis 02/16/2015  . Simple chronic bronchitis ([place]) 08/27/2016   Resolved Ambulatory Problems    Diagnosis Date Noted  . No Resolved Ambulatory Problems   Past Medical History:  Diagnosis Date  . Cervical dysplasia       Review of Systems  All other systems reviewed and are negative.      Objective:   Physical Exam  Constitutional: She is oriented to person, place, and time. She appears well-developed and well-nourished.  HENT:  Head: Normocephalic and atraumatic.  Cardiovascular: Normal rate, regular rhythm and normal heart sounds.   Pulmonary/Chest: Effort normal and breath sounds normal. She has no wheezes.  Musculoskeletal:  Normal ROM of left shoulder and neck.  Tenderness above the scapula.  No tenderness to palpation over left shoulder.  Muscle tightness noticed to palpitation of left upper back.  Strength of bilateral arms 5/5.    Neurological: She is alert and oriented to person, place, and time.  Psychiatric: She has a normal mood and affect. Her behavior is normal.          Assessment & Plan:  Marland KitchenMarland KitchenJacqlyn was seen today for follow-up.  Diagnoses and all orders for this visit:  DDD (degenerative disc disease), lumbar -     Pain Mgmt, Profile 6 Conf w/o mM, U -     Discontinue: oxyCODONE-acetaminophen (PERCOCET) 10-325 MG tablet; Take 0.5-1 tablets by mouth every 6 (six) hours as needed for pain. -     Discontinue: oxyCODONE-acetaminophen (PERCOCET) 10-325 MG tablet; Take 0.5-1 tablets by mouth every 6 (six) hours as needed for pain. Do not refill until 09/27/16. -     oxyCODONE-acetaminophen (PERCOCET) 10-325 MG tablet; Take 0.5-1 tablets by mouth every 6 (six) hours as needed for pain. Do not refill until 10/26/16.  Chronic pain syndrome -     Pain Mgmt, Profile 6 Conf w/o mM, U -     oxyCODONE-acetaminophen (PERCOCET) 10-325 MG tablet; Take 0.5-1 tablets by mouth every 6 (six) hours as needed for pain. Do not refill until 10/26/16.  Upper back pain on left side  Simple chronic bronchitis ([facility]) -     budesonide-formoterol (SYMBICORT) 80-4.5 MCG/ACT inhaler; Inhale 2 puffs into the lungs 2 (two) times daily.   Refilled opoid for 3 months.  Pain contract done 11/2015.  Last UDS 11/2015. UDS ordered today.  Opioid risk assessment tool was  0.  Discussed will need to stop valium with opoid. She only uses as needed. She is ok with this.   She started back smoking. She still has starter pack of chantix. She would like to start trying to stop smoking again. Follow up in 1 month after finish starter pack.   New coupon card given for symbicort. PE was reassuring today.   Upper back sounds muscular. Discussed ice/heat, biofreeze, given exercise to do, consider massage, NSAID as needed. Consider muscle relaxer if conservative treatment above does not help.

## 2016-09-01 LAB — PAIN MGMT, PROFILE 6 CONF W/O MM, U
6 ACETYLMORPHINE: NEGATIVE ng/mL (ref <10)
ALCOHOL METABOLITES: NEGATIVE ng/mL (ref <500)
AMPHETAMINES: NEGATIVE ng/mL (ref <500)
Barbiturates: NEGATIVE ng/mL (ref <300)
Benzodiazepines: NEGATIVE ng/mL (ref <100)
CODEINE: NEGATIVE ng/mL (ref <50)
Cocaine Metabolite: NEGATIVE ng/mL (ref <150)
Creatinine: 51 mg/dL (ref >=20.0)
HYDROCODONE: NEGATIVE ng/mL (ref <50)
HYDROMORPHONE: NEGATIVE ng/mL (ref <50)
MORPHINE: NEGATIVE ng/mL (ref <50)
Marijuana Metabolite: NEGATIVE ng/mL (ref <20)
Methadone Metabolite: NEGATIVE ng/mL (ref <100)
NORHYDROCODONE: NEGATIVE ng/mL (ref <50)
Noroxycodone: 3628 ng/mL — ABNORMAL HIGH (ref <50)
OXIDANT: NEGATIVE ug/mL (ref <200)
Opiates: NEGATIVE ng/mL (ref <100)
Oxycodone: 1084 ng/mL — ABNORMAL HIGH (ref <50)
Oxycodone: POSITIVE ng/mL — AB (ref <100)
Oxymorphone: 791 ng/mL — ABNORMAL HIGH (ref <50)
PHENCYCLIDINE: NEGATIVE ng/mL (ref <25)
PLEASE NOTE: 0
pH: 7.19 (ref 4.5–9.0)

## 2016-09-03 ENCOUNTER — Encounter: Payer: Self-pay | Admitting: Physician Assistant

## 2016-09-03 DIAGNOSIS — Z716 Tobacco abuse counseling: Secondary | ICD-10-CM

## 2016-09-03 MED ORDER — VARENICLINE TARTRATE 0.5 MG X 11 & 1 MG X 42 PO MISC: ORAL | 0 refills | Status: DC

## 2016-09-17 ENCOUNTER — Encounter: Payer: Self-pay | Admitting: Physician Assistant

## 2016-11-11 ENCOUNTER — Encounter: Payer: Self-pay | Admitting: Physician Assistant

## 2016-11-12 ENCOUNTER — Ambulatory Visit (INDEPENDENT_AMBULATORY_CARE_PROVIDER_SITE_OTHER): Payer: BLUE CROSS/BLUE SHIELD | Admitting: Physician Assistant

## 2016-11-12 ENCOUNTER — Encounter: Payer: Self-pay | Admitting: Physician Assistant

## 2016-11-12 VITALS — BP 150/92 | HR 68 | Ht 67.01 in | Wt 140.0 lb

## 2016-11-12 DIAGNOSIS — J441 Chronic obstructive pulmonary disease with (acute) exacerbation: Secondary | ICD-10-CM | POA: Diagnosis not present

## 2016-11-12 MED ORDER — LEVOFLOXACIN 500 MG PO TABS: 500.0000 mg | ORAL_TABLET | Freq: Every day | ORAL | 0 refills | Status: DC

## 2016-11-12 MED ORDER — PREDNISONE 50 MG PO TABS: ORAL_TABLET | ORAL | 0 refills | Status: DC

## 2016-11-12 NOTE — Progress Notes (Signed)
   Subjective:    Patient ID: Sue Smith, female    DOB: 1955-04-02, 61 y.o.   MRN: [id number]  HPI Patient is a 61 year old female with COPD that comes into the clinic complaining of chest congestion, cough, shortness of breath and easily fatigued.  She uses Symbicort daily.  About 2 weeks ago she went to her granddaughter's soccer game and felt like she got a cold.  It has not improved.  She is using Mucinex D with little relief.  She denies any fever, chills.  She continues to smoke.  Patient tried smoking cessation with Chantix.  After the first week she became extremely nauseated.  She had to stop this.  She continues to smoke.  She has decreased in her amount.  She admits to smoking 1-5 cigarettes a day depending on the day.  She is still very interested in smoking cessation.  .. Active Ambulatory Problems    Diagnosis Date Noted  . Essential hypertension 11/04/2011  . Surgical menopause 11/04/2011  . Vasomotor flushing 11/18/2011  . Pre-diabetes 11/20/2011  . Personal history of colonic polyps 03/26/2012  . Osteoporosis 11/25/2012  . Hyperlipidemia 11/25/2012  . DDD (degenerative disc disease), lumbar 03/23/2013  . Insomnia 11/25/2013  . COPD bronchitis 02/16/2015  . Simple chronic bronchitis ([place]) 08/27/2016   Resolved Ambulatory Problems    Diagnosis Date Noted  . No Resolved Ambulatory Problems   Past Medical History:  Diagnosis Date  . Cervical dysplasia       Review of Systems  All other systems reviewed and are negative.      Objective:   Physical Exam  Constitutional: She is oriented to person, place, and time. She appears well-developed and well-nourished.  HENT:  Head: Normocephalic and atraumatic.  Right Ear: External ear normal.  Left Ear: External ear normal.  Nose: Nose normal.  Mouth/Throat: Oropharynx is clear and moist.  Eyes: Conjunctivae are normal. Right eye exhibits no discharge. Left eye exhibits no discharge.  Neck: Normal range of  motion. Neck supple.  Cardiovascular: Normal rate, regular rhythm and normal heart sounds.   Pulmonary/Chest: Effort normal.  Base of left lungs rales.  Scattered rhonchi throughout.  No wheezing  Lymphadenopathy:    She has cervical adenopathy.  Neurological: She is alert and oriented to person, place, and time.  Psychiatric: She has a normal mood and affect. Her behavior is normal.          Assessment & Plan:  Marland KitchenMarland KitchenJaclin was seen today for chest cold.  Diagnoses and all orders for this visit:  COPD exacerbation ([place]) -     levofloxacin (LEVAQUIN) 500 MG tablet; Take 1 tablet (500 mg total) by mouth daily. For 5 days. -     predniSONE (DELTASONE) 50 MG tablet; Take one tablet for 5 days.  pulse ox 96 percent. BP elevated likely due to decongestant and coughing. She has CPE in 2 weeks. We can recheck then.  Treated with levaquin due to multiple allergies and prednisone for COPD exacerbation.  Continue mucinex drop the decongestant.  Continue symbicort.  Follow up as needed.   Strongly encourage patient to stop smoking.  She could consider patches.  She is willing to discuss this at next office visit.

## 2016-11-12 NOTE — Patient Instructions (Signed)
Chronic Obstructive Pulmonary Disease Exacerbation  Chronic obstructive pulmonary disease (COPD) is a common lung problem. In COPD, the flow of air from the lungs is limited. COPD exacerbations are times that breathing gets worse and you need extra treatment. Without treatment they can be life threatening. If they happen often, your lungs can become more damaged. If your COPD gets worse, your doctor may treat you with:  ? Medicines.  ? Oxygen.  ? Different ways to clear your airway, such as using a mask.    Follow these instructions at home:  ? Do not smoke.  ? Avoid tobacco smoke and other things that bother your lungs.  ? If given, take your antibiotic medicine as told. Finish the medicine even if you start to feel better.  ? Only take medicines as told by your doctor.  ? Drink enough fluids to keep your pee (urine) clear or pale yellow (unless your doctor has told you not to).  ? Use a cool mist machine (vaporizer).  ? If you use oxygen or a machine that turns liquid medicine into a mist (nebulizer), continue to use them as told.  ? Keep up with shots (vaccinations) as told by your doctor.  ? Exercise regularly.  ? Eat healthy foods.  ? Keep all doctor visits as told.  Get help right away if:  ? You are very short of breath and it gets worse.  ? You have trouble talking.  ? You have bad chest pain.  ? You have blood in your spit (sputum).  ? You have a fever.  ? You keep throwing up (vomiting).  ? You feel weak, or you pass out (faint).  ? You feel confused.  ? You keep getting worse.  This information is not intended to replace advice given to you by your health care provider. Make sure you discuss any questions you have with your health care provider.  Document Released: 12/20/2010 Document Revised: 06/08/2015 Document Reviewed: 09/04/2012  Elsevier Interactive Patient Education ? 2017 Elsevier Inc[employer]

## 2016-11-13 ENCOUNTER — Ambulatory Visit: Payer: BLUE CROSS/BLUE SHIELD | Admitting: Sports Medicine

## 2016-11-17 ENCOUNTER — Encounter: Payer: Self-pay | Admitting: Physician Assistant

## 2016-11-18 ENCOUNTER — Other Ambulatory Visit: Payer: Self-pay | Admitting: Family Medicine

## 2016-11-18 ENCOUNTER — Other Ambulatory Visit: Payer: Self-pay | Admitting: Physician Assistant

## 2016-11-18 DIAGNOSIS — J441 Chronic obstructive pulmonary disease with (acute) exacerbation: Secondary | ICD-10-CM

## 2016-11-18 MED ORDER — PREDNISONE 50 MG PO TABS: ORAL_TABLET | ORAL | 0 refills | Status: DC

## 2016-11-18 MED ORDER — LEVOFLOXACIN 500 MG PO TABS: 500.0000 mg | ORAL_TABLET | Freq: Every day | ORAL | 0 refills | Status: DC

## 2016-11-26 ENCOUNTER — Encounter: Payer: Self-pay | Admitting: Physician Assistant

## 2016-11-26 ENCOUNTER — Other Ambulatory Visit: Payer: Self-pay | Admitting: Physician Assistant

## 2016-11-26 ENCOUNTER — Ambulatory Visit (INDEPENDENT_AMBULATORY_CARE_PROVIDER_SITE_OTHER): Payer: BLUE CROSS/BLUE SHIELD | Admitting: Physician Assistant

## 2016-11-26 VITALS — BP 135/72 | HR 75 | Ht 67.0 in | Wt 139.0 lb

## 2016-11-26 DIAGNOSIS — Z23 Encounter for immunization: Secondary | ICD-10-CM

## 2016-11-26 DIAGNOSIS — G894 Chronic pain syndrome: Secondary | ICD-10-CM | POA: Diagnosis not present

## 2016-11-26 DIAGNOSIS — Z0000 Encounter for general adult medical examination without abnormal findings: Secondary | ICD-10-CM

## 2016-11-26 DIAGNOSIS — K219 Gastro-esophageal reflux disease without esophagitis: Secondary | ICD-10-CM | POA: Diagnosis not present

## 2016-11-26 DIAGNOSIS — E7800 Pure hypercholesterolemia, unspecified: Secondary | ICD-10-CM

## 2016-11-26 DIAGNOSIS — J410 Simple chronic bronchitis: Secondary | ICD-10-CM | POA: Diagnosis not present

## 2016-11-26 DIAGNOSIS — I10 Essential (primary) hypertension: Secondary | ICD-10-CM

## 2016-11-26 DIAGNOSIS — Z13220 Encounter for screening for lipoid disorders: Secondary | ICD-10-CM | POA: Diagnosis not present

## 2016-11-26 DIAGNOSIS — L570 Actinic keratosis: Secondary | ICD-10-CM

## 2016-11-26 DIAGNOSIS — M810 Age-related osteoporosis without current pathological fracture: Secondary | ICD-10-CM

## 2016-11-26 DIAGNOSIS — Z780 Asymptomatic menopausal state: Secondary | ICD-10-CM

## 2016-11-26 DIAGNOSIS — Z720 Tobacco use: Secondary | ICD-10-CM

## 2016-11-26 DIAGNOSIS — M5136 Other intervertebral disc degeneration, lumbar region: Secondary | ICD-10-CM

## 2016-11-26 LAB — LIPID PANEL W/REFLEX DIRECT LDL
CHOL/HDL RATIO: 2.8 (calc) (ref <5.0)
Cholesterol: 160 mg/dL (ref <200)
HDL: 57 mg/dL (ref >50)
LDL CHOLESTEROL (CALC): 82 mg/dL
NON-HDL CHOLESTEROL (CALC): 103 mg/dL (ref <130)
TRIGLYCERIDES: 111 mg/dL (ref <150)

## 2016-11-26 LAB — COMPLETE METABOLIC PANEL WITH GFR
AG Ratio: 1.5 (calc) (ref 1.0–2.5)
ALKALINE PHOSPHATASE (APISO): 62 U/L (ref 33–130)
ALT: 9 U/L (ref 6–29)
AST: 7 U/L — AB (ref 10–35)
Albumin: 4 g/dL (ref 3.6–5.1)
BILIRUBIN TOTAL: 0.5 mg/dL (ref 0.2–1.2)
BUN: 16 mg/dL (ref 7–25)
CHLORIDE: 102 mmol/L (ref 98–110)
CO2: 32 mmol/L (ref 20–32)
Calcium: 9.3 mg/dL (ref 8.6–10.4)
Creat: 1.03 mg/dL (ref 0.50–1.05)
GFR, Est African American: 69 mL/min/{1.73_m2} (ref >=60)
GFR, Est Non African American: 59 mL/min/{1.73_m2} — ABNORMAL LOW (ref >=60)
GLUCOSE: 87 mg/dL (ref 65–99)
Globulin: 2.7 g/dL (calc) (ref 1.9–3.7)
Potassium: 4.1 mmol/L (ref 3.5–5.3)
Sodium: 140 mmol/L (ref 135–146)
TOTAL PROTEIN: 6.7 g/dL (ref 6.1–8.1)

## 2016-11-26 MED ORDER — ATORVASTATIN CALCIUM 20 MG PO TABS: 20.0000 mg | ORAL_TABLET | Freq: Every day | ORAL | 3 refills | Status: DC

## 2016-11-26 MED ORDER — OXYCODONE-ACETAMINOPHEN 10-325 MG PO TABS: 0.5000 | ORAL_TABLET | Freq: Four times a day (QID) | ORAL | 0 refills | Status: DC | PRN

## 2016-11-26 MED ORDER — ALENDRONATE SODIUM 70 MG PO TABS: ORAL_TABLET | ORAL | 3 refills | Status: DC

## 2016-11-26 MED ORDER — LISINOPRIL 20 MG PO TABS: 40.0000 mg | ORAL_TABLET | Freq: Every day | ORAL | 3 refills | Status: DC

## 2016-11-26 MED ORDER — RANITIDINE HCL 150 MG PO TABS: 150.0000 mg | ORAL_TABLET | Freq: Two times a day (BID) | ORAL | 3 refills | Status: DC

## 2016-11-26 MED ORDER — BUDESONIDE-FORMOTEROL FUMARATE 80-4.5 MCG/ACT IN AERO: 2.0000 | INHALATION_SPRAY | Freq: Two times a day (BID) | RESPIRATORY_TRACT | 3 refills | Status: DC

## 2016-11-26 NOTE — Patient Instructions (Signed)

## 2016-11-27 ENCOUNTER — Other Ambulatory Visit: Payer: Self-pay | Admitting: Physician Assistant

## 2016-11-27 DIAGNOSIS — Z1239 Encounter for other screening for malignant neoplasm of breast: Secondary | ICD-10-CM

## 2016-11-29 ENCOUNTER — Encounter: Payer: Self-pay | Admitting: Physician Assistant

## 2016-11-29 DIAGNOSIS — K219 Gastro-esophageal reflux disease without esophagitis: Secondary | ICD-10-CM | POA: Insufficient documentation

## 2016-11-29 DIAGNOSIS — G894 Chronic pain syndrome: Secondary | ICD-10-CM | POA: Insufficient documentation

## 2016-11-29 DIAGNOSIS — L570 Actinic keratosis: Secondary | ICD-10-CM | POA: Insufficient documentation

## 2016-11-29 NOTE — Progress Notes (Signed)
Subjective:     Sue Smith is a 61 y.o. female and is here for a comprehensive physical exam. The patient reports problems - she does have a scaly lesion under left eye that has been present for the last few months. she scratches offf but comes back. no bleeding or pain. .  Social History   Socioeconomic History  . Marital status: Widowed    Spouse name: Not on file  . Number of children: Not on file  . Years of education: Not on file  . Highest education level: Not on file  Social Needs  . Financial resource strain: Not on file  . Food insecurity - worry: Not on file  . Food insecurity - inability: Not on file  . Transportation needs - medical: Not on file  . Transportation needs - non-medical: Not on file  Occupational History  . Not on file  Tobacco Use  . Smoking status: Current Every Day Smoker    Types: Cigarettes  . Smokeless tobacco: Never Used  Substance and Sexual Activity  . Alcohol use: Yes    Alcohol/week: 1.0 oz    Types: 2 Standard drinks or equivalent per week  . Drug use: No  . Sexual activity: Not on file  Other Topics Concern  . Not on file  Social History Narrative  . Not on file   Health Maintenance  Topic Date Due  . HIV Screening  07/26/1972  . PAP SMEAR  11/30/2045 (Originally 07/27/1978)  . DEXA SCAN  01/04/2017  . TETANUS/TDAP  01/14/2017  . MAMMOGRAM  01/11/2018  . COLONOSCOPY  07/01/2022  . INFLUENZA VACCINE  Completed  . Hepatitis C Screening  Completed    The following portions of the patient's history were reviewed and updated as appropriate: allergies, current medications, past family history, past medical history, past social history, past surgical history and problem list.  Review of Systems Pertinent items noted in HPI and remainder of comprehensive ROS otherwise negative.   Objective:    BP 135/72   Pulse 75   Ht 5\' 7"  (1.702 m)   Wt 139 lb (63 kg)   BMI 21.77 kg/m  General appearance: alert, cooperative and appears  stated age Head: Normocephalic, without obvious abnormality, atraumatic Eyes: conjunctivae/corneas clear. PERRL, EOM's intact. Fundi benign. Ears: normal TM's and external ear canals both ears Nose: Nares normal. Septum midline. Mucosa normal. No drainage or sinus tenderness. Throat: lips, mucosa, and tongue normal; teeth and gums normal Neck: no adenopathy, no carotid bruit, no JVD, supple, symmetrical, trachea midline and thyroid not enlarged, symmetric, no tenderness/mass/nodules Back: symmetric, no curvature. ROM normal. No CVA tenderness. Lungs: clear to auscultation bilaterally Heart: regular rate and rhythm, S1, S2 normal, no murmur, click, rub or gallop Abdomen: soft, non-tender; bowel sounds normal; no masses,  no organomegaly Extremities: extremities normal, atraumatic, no cyanosis or edema Pulses: 2+ and symmetric Skin: Skin color, texture, turgor normal. No rashes or lesions small 16mm scaly erythematous lesion under left eye. Lymph nodes: Cervical, supraclavicular, and axillary nodes normal. Neurologic: Alert and oriented X 3, normal strength and tone. Normal symmetric reflexes. Normal coordination and gait    Assessment:    Healthy female exam.      Plan:    .Marland KitchenMarland KitchenPerrie was seen today for annual exam.  Diagnoses and all orders for this visit:  Routine physical examination  Simple chronic bronchitis (Sue Smith) -     budesonide-formoterol (SYMBICORT) 80-4.5 MCG/ACT inhaler; Inhale 2 puffs 2 (two) times daily into the  lungs.  Essential hypertension -     lisinopril (PRINIVIL,ZESTRIL) 20 MG tablet; Take 2 tablets (40 mg total) daily by mouth. -     COMPLETE METABOLIC PANEL WITH GFR  Gastroesophageal reflux disease, esophagitis presence not specified -     ranitidine (ZANTAC) 150 MG tablet; Take 1 tablet (150 mg total) 2 (two) times daily by mouth.  DDD (degenerative disc disease), lumbar -     Discontinue: oxyCODONE-acetaminophen (PERCOCET) 10-325 MG tablet; Take 0.5-1  tablets every 6 (six) hours as needed by mouth for pain. -     Discontinue: oxyCODONE-acetaminophen (PERCOCET) 10-325 MG tablet; Take 0.5-1 tablets every 6 (six) hours as needed by mouth for pain. Do not refill until 12/26/16. -     oxyCODONE-acetaminophen (PERCOCET) 10-325 MG tablet; Take 0.5-1 tablets every 6 (six) hours as needed by mouth for pain. Do not refill until 01/26/17.  Chronic pain syndrome -     Discontinue: oxyCODONE-acetaminophen (PERCOCET) 10-325 MG tablet; Take 0.5-1 tablets every 6 (six) hours as needed by mouth for pain. -     Discontinue: oxyCODONE-acetaminophen (PERCOCET) 10-325 MG tablet; Take 0.5-1 tablets every 6 (six) hours as needed by mouth for pain. Do not refill until 12/26/16. -     oxyCODONE-acetaminophen (PERCOCET) 10-325 MG tablet; Take 0.5-1 tablets every 6 (six) hours as needed by mouth for pain. Do not refill until 01/26/17.  Influenza vaccine needed -     Flu Vaccine QUAD 6+ mos PF IM (Fluarix Quad PF)  Screening for lipid disorders -     Lipid Panel w/reflex Direct LDL  Actinic keratosis  Tobacco abuse -     Ambulatory Referral for Lung Cancer Scre  Age-related osteoporosis without current pathological fracture -     alendronate (FOSAMAX) 70 MG tablet; TAKE ONE TABLET BY MOUTH ONCE A WEEK, TAKE WITH A FULL GLASS OF WATER ON AN EMPTY STOMACH  Pure hypercholesterolemia -     atorvastatin (LIPITOR) 20 MG tablet; Take 1 tablet (20 mg total) daily by mouth.  .. Depression screen [employer] [employer] 2/9 11/26/2016 06/05/2016  Decreased Interest 0 1  Down, Depressed, Hopeless 0 0  PHQ - 2 Score 0 1   .Marland Kitchen Discussed 150 minutes of exercise a week.  Encouraged vitamin D 1000 units and Calcium 1300mg  or 4 servings of dairy a day.   Mammogram up to date.  Bone density due in December.  Pap not indicated.  Colonoscopy up to date.   Medications refilled.   Chronic pain:  [place] controlled substance database reviewed with no concerns.  Pain contract up to date.  Follow  up in 3 months.   Discussed lesion under eye appears like precancerous AK.  Cryo done in office.   Cryotherapy Procedure Note  Pre-operative Diagnosis: Actinic keratosis  Post-operative Diagnosis: Actinic keratosis  Locations: under left eye  Indications: precancerous  Procedure Details  History of allergy to iodine: no. Pacemaker? no.  Patient informed of risks (permanent scarring, infection, light or dark discoloration, bleeding, infection, weakness, numbness and recurrence of the lesion) and benefits of the procedure and verbal informed consent obtained.  The areas are treated with liquid nitrogen therapy, frozen until ice ball extended 2 mm beyond lesion, allowed to thaw, and treated again. The patient tolerated procedure well.  The patient was instructed on post-op care, warned that there may be blister formation, redness and pain. Recommend OTC analgesia as needed for pain.  Condition: Stable  Complications: none.  Plan: 1. Instructed to keep the area dry and covered  for 24-48h and clean thereafter. 2. Warning signs of infection were reviewed.   3. Recommended that the patient use OTC acetaminophen as needed for pain.    See After Visit Summary for Counseling Recommendations

## 2016-12-17 ENCOUNTER — Encounter: Payer: Self-pay | Admitting: Physician Assistant

## 2017-01-08 ENCOUNTER — Ambulatory Visit (INDEPENDENT_AMBULATORY_CARE_PROVIDER_SITE_OTHER): Payer: BLUE CROSS/BLUE SHIELD

## 2017-01-08 ENCOUNTER — Telehealth: Payer: Self-pay

## 2017-01-08 DIAGNOSIS — Z1231 Encounter for screening mammogram for malignant neoplasm of breast: Secondary | ICD-10-CM

## 2017-01-08 DIAGNOSIS — Z780 Asymptomatic menopausal state: Secondary | ICD-10-CM | POA: Diagnosis not present

## 2017-01-08 DIAGNOSIS — M810 Age-related osteoporosis without current pathological fracture: Secondary | ICD-10-CM

## 2017-01-08 DIAGNOSIS — Z1239 Encounter for other screening for malignant neoplasm of breast: Secondary | ICD-10-CM

## 2017-01-08 NOTE — Progress Notes (Signed)
Call pt: normal mammogram. Follow up in 1 year.

## 2017-01-08 NOTE — Telephone Encounter (Signed)
Called patient and relayed message.

## 2017-01-08 NOTE — Telephone Encounter (Signed)
-----   Message from Donella Stade, [place] sent at 01/08/2017  9:27 AM EST ----- Call pt: normal mammogram. Follow up in 1 year.

## 2017-01-14 ENCOUNTER — Encounter: Payer: Self-pay | Admitting: Physician Assistant

## 2017-01-27 ENCOUNTER — Encounter: Payer: Self-pay | Admitting: Physician Assistant

## 2017-01-28 ENCOUNTER — Ambulatory Visit: Payer: BLUE CROSS/BLUE SHIELD | Admitting: Physician Assistant

## 2017-01-28 VITALS — BP 132/80 | HR 84 | Temp 98.6°F

## 2017-01-28 DIAGNOSIS — N3001 Acute cystitis with hematuria: Secondary | ICD-10-CM

## 2017-01-28 DIAGNOSIS — R300 Dysuria: Secondary | ICD-10-CM | POA: Diagnosis not present

## 2017-01-28 LAB — POCT URINALYSIS DIPSTICK
BILIRUBIN UA: NEGATIVE
GLUCOSE UA: NEGATIVE
KETONES UA: NEGATIVE
Nitrite, UA: NEGATIVE
PH UA: 5.5 (ref 5.0–8.0)
Spec Grav, UA: 1.025 (ref 1.010–1.025)
UROBILINOGEN UA: 0.2 U/dL

## 2017-01-28 MED ORDER — CIPROFLOXACIN HCL 500 MG PO TABS: 500.0000 mg | ORAL_TABLET | Freq: Two times a day (BID) | ORAL | 0 refills | Status: DC

## 2017-01-28 NOTE — Progress Notes (Signed)
Sent cipro for 3 days.

## 2017-01-28 NOTE — Telephone Encounter (Signed)
Pt advised.

## 2017-01-28 NOTE — Progress Notes (Signed)
Pt came into clinic for eval of dysuria. Pt states she has been having symptoms for 2 days. She does have a long allergy list and reports she usually takes Cipro when these occur. Pt was able to provide a urine sample, advised we would contact her with results. No further questions.   .. Results for orders placed or performed in visit on 01/28/17  POCT Urinalysis Dipstick  Result Value Ref Range   Color, UA yellow    Clarity, UA clear    Glucose, UA neg    Bilirubin, UA neg    Ketones, UA neg    Spec Grav, UA 1.025 1.010 - 1.025   Blood, UA mod    pH, UA 5.5 5.0 - 8.0   Protein, UA trace    Urobilinogen, UA 0.2 0.2 or 1.0 E.U./dL   Nitrite, UA neg    Leukocytes, UA Moderate (2+) (A) Negative   Appearance     Odor     Please culture.  Will send over cipro 500mg  bid for 3 days for uncomplicated UTI. Follow up as needed.  Iran Planas PA-C

## 2017-01-30 LAB — URINE CULTURE
MICRO NUMBER:: 90061040
SPECIMEN QUALITY:: ADEQUATE

## 2017-02-03 ENCOUNTER — Encounter: Payer: Self-pay | Admitting: Physician Assistant

## 2017-02-25 ENCOUNTER — Encounter: Payer: Self-pay | Admitting: Physician Assistant

## 2017-02-25 ENCOUNTER — Ambulatory Visit: Payer: BLUE CROSS/BLUE SHIELD | Admitting: Physician Assistant

## 2017-02-25 VITALS — BP 146/86 | HR 69 | Ht 67.0 in | Wt 138.0 lb

## 2017-02-25 DIAGNOSIS — J410 Simple chronic bronchitis: Secondary | ICD-10-CM | POA: Diagnosis not present

## 2017-02-25 DIAGNOSIS — M5136 Other intervertebral disc degeneration, lumbar region: Secondary | ICD-10-CM

## 2017-02-25 DIAGNOSIS — G894 Chronic pain syndrome: Secondary | ICD-10-CM | POA: Diagnosis not present

## 2017-02-25 DIAGNOSIS — F17200 Nicotine dependence, unspecified, uncomplicated: Secondary | ICD-10-CM

## 2017-02-25 DIAGNOSIS — I10 Essential (primary) hypertension: Secondary | ICD-10-CM

## 2017-02-25 MED ORDER — OXYCODONE-ACETAMINOPHEN 10-325 MG PO TABS: 0.5000 | ORAL_TABLET | Freq: Four times a day (QID) | ORAL | 0 refills | Status: DC | PRN

## 2017-02-25 NOTE — Progress Notes (Signed)
   Subjective:    Patient ID: Sue Smith, female    DOB: 1955/12/17, 62 y.o.   MRN: [id number]  HPI Pt is a 62 yo pleasant female with chronic pain, HTN and COPD who presents to the clinic for 3 month medication refills.   Overall pain is controlled. She has been on this regimen for numerous years. No problems or concerns.   HTN- pt reports checking BP at home and ranging in the 130's over 80's. No CP, palpiations, SOB, vision changes or headaches.   COPD- controlled with symbicort. She continues to smoke dialy she has cut back from 2 packs a day to 1 pack a day.   .. Active Ambulatory Problems    Diagnosis Date Noted  . Essential hypertension 11/04/2011  . Surgical menopause 11/04/2011  . Vasomotor flushing 11/18/2011  . Pre-diabetes 11/20/2011  . Personal history of colonic polyps 03/26/2012  . Osteoporosis 11/25/2012  . Hyperlipidemia 11/25/2012  . DDD (degenerative disc disease), lumbar 03/23/2013  . Insomnia 11/25/2013  . COPD bronchitis 02/16/2015  . Simple chronic bronchitis ([place]) 08/27/2016  . Actinic keratosis 11/29/2016  . Chronic pain syndrome 11/29/2016  . Gastroesophageal reflux disease 11/29/2016  . Current smoker 02/26/2017   Resolved Ambulatory Problems    Diagnosis Date Noted  . No Resolved Ambulatory Problems   Past Medical History:  Diagnosis Date  . Cervical dysplasia      Review of Systems  All other systems reviewed and are negative.      Objective:   Physical Exam  Constitutional: She is oriented to person, place, and time. She appears well-developed and well-nourished.  HENT:  Head: Normocephalic and atraumatic.  Cardiovascular: Normal rate, regular rhythm and normal heart sounds.  Pulmonary/Chest: Effort normal and breath sounds normal. She has no wheezes.  Neurological: She is alert and oriented to person, place, and time.  Psychiatric: She has a normal mood and affect. Her behavior is normal.          Assessment & Plan:    Marland KitchenMarland KitchenDiagnoses and all orders for this visit:  Current smoker  DDD (degenerative disc disease), lumbar -     Discontinue: oxyCODONE-acetaminophen (PERCOCET) 10-325 MG tablet; Take 0.5-1 tablets by mouth every 6 (six) hours as needed for pain. -     Discontinue: oxyCODONE-acetaminophen (PERCOCET) 10-325 MG tablet; Take 0.5-1 tablets by mouth every 6 (six) hours as needed for pain. -     oxyCODONE-acetaminophen (PERCOCET) 10-325 MG tablet; Take 0.5-1 tablets by mouth every 6 (six) hours as needed for pain.  Chronic pain syndrome -     Discontinue: oxyCODONE-acetaminophen (PERCOCET) 10-325 MG tablet; Take 0.5-1 tablets by mouth every 6 (six) hours as needed for pain. -     Discontinue: oxyCODONE-acetaminophen (PERCOCET) 10-325 MG tablet; Take 0.5-1 tablets by mouth every 6 (six) hours as needed for pain. -     oxyCODONE-acetaminophen (PERCOCET) 10-325 MG tablet; Take 0.5-1 tablets by mouth every 6 (six) hours as needed for pain.  Simple chronic bronchitis ([place])  Essential hypertension   Follow up in 3 months. Updated pain contract. UDS done in august 2018.  [place] Controlled substance database reviewed with no concerns.  2nd recheck BP went down some. Continue to monitor at home and send me readings.   Continue symbicort. Pt is trying to cut back on her own. She does not want any additional support with medications. She is over 50 and greater than 30 pak year history. Will send for low dose lung CT.

## 2017-02-25 NOTE — Patient Instructions (Signed)
Low dose CT of lungs for lung cancer screening.

## 2017-02-26 DIAGNOSIS — F17200 Nicotine dependence, unspecified, uncomplicated: Secondary | ICD-10-CM | POA: Insufficient documentation

## 2017-03-06 ENCOUNTER — Encounter: Payer: Self-pay | Admitting: Physician Assistant

## 2017-04-08 ENCOUNTER — Encounter: Payer: Self-pay | Admitting: Physician Assistant

## 2017-05-05 ENCOUNTER — Encounter: Payer: Self-pay | Admitting: Physician Assistant

## 2017-05-14 ENCOUNTER — Telehealth: Payer: Self-pay | Admitting: Physician Assistant

## 2017-05-14 DIAGNOSIS — M5136 Other intervertebral disc degeneration, lumbar region: Secondary | ICD-10-CM

## 2017-05-14 DIAGNOSIS — G894 Chronic pain syndrome: Secondary | ICD-10-CM

## 2017-05-14 MED ORDER — HYDROXYZINE HCL 25 MG PO TABS: 25.0000 mg | ORAL_TABLET | Freq: Three times a day (TID) | ORAL | 0 refills | Status: DC | PRN

## 2017-05-14 MED ORDER — OXYCODONE-ACETAMINOPHEN 10-325 MG PO TABS: 0.5000 | ORAL_TABLET | Freq: Four times a day (QID) | ORAL | 0 refills | Status: DC | PRN

## 2017-05-14 NOTE — Telephone Encounter (Signed)
Patient advised. Verbalized understanding 

## 2017-05-14 NOTE — Telephone Encounter (Signed)
I am so sorry to hear this.  I sent refill of percocet for this month.  I sent vistaril to take as needed every 8hours. I want to avoid benzodiazepines due to increase risk of sudden death with benzo and opiates. Follow up in one month as needed.

## 2017-05-14 NOTE — Telephone Encounter (Signed)
Received phone call from Pt stating her husband passed away in the home last night. Pt reports he suffered from heart problems, DM, and a list of other things. She is requesting something to help with her nerves during this time, and also a refill on her pain medication.   Pt would like Rx for nerves sent to [facility] on [address], and pain Rx goes to [facility] per pain contract.  Pt feels she is due to have a repeat UDS for her pain contract, reports she can do that at this time if necessary.   Will route.

## 2017-05-19 ENCOUNTER — Encounter: Payer: Self-pay | Admitting: Physician Assistant

## 2017-06-25 ENCOUNTER — Ambulatory Visit: Payer: BLUE CROSS/BLUE SHIELD | Admitting: Physician Assistant

## 2017-06-25 ENCOUNTER — Encounter: Payer: Self-pay | Admitting: Physician Assistant

## 2017-06-25 VITALS — BP 176/66 | HR 58 | Ht 67.0 in | Wt 137.0 lb

## 2017-06-25 DIAGNOSIS — J441 Chronic obstructive pulmonary disease with (acute) exacerbation: Secondary | ICD-10-CM | POA: Diagnosis not present

## 2017-06-25 DIAGNOSIS — F4321 Adjustment disorder with depressed mood: Secondary | ICD-10-CM

## 2017-06-25 DIAGNOSIS — M5136 Other intervertebral disc degeneration, lumbar region: Secondary | ICD-10-CM

## 2017-06-25 DIAGNOSIS — G894 Chronic pain syndrome: Secondary | ICD-10-CM

## 2017-06-25 DIAGNOSIS — I10 Essential (primary) hypertension: Secondary | ICD-10-CM

## 2017-06-25 MED ORDER — OXYCODONE-ACETAMINOPHEN 10-325 MG PO TABS: 0.5000 | ORAL_TABLET | Freq: Four times a day (QID) | ORAL | 0 refills | Status: DC | PRN

## 2017-06-25 MED ORDER — OXYCODONE-ACETAMINOPHEN 10-325 MG PO TABS: 1.0000 | ORAL_TABLET | Freq: Four times a day (QID) | ORAL | 0 refills | Status: DC | PRN

## 2017-06-25 MED ORDER — PREDNISONE 50 MG PO TABS: ORAL_TABLET | ORAL | 0 refills | Status: DC

## 2017-06-25 MED ORDER — FLUTICASONE-UMECLIDIN-VILANT 100-62.5-25 MCG/INH IN AEPB: 1.0000 | INHALATION_SPRAY | Freq: Every day | RESPIRATORY_TRACT | 2 refills | Status: DC

## 2017-06-25 NOTE — Progress Notes (Signed)
Subjective:    Patient ID: Sue Smith, female    DOB: 12-18-1955, 62 y.o.   MRN: [id number]  HPI  Pt is a 62 yo female with HTN, COPD, GERD, chronic pain who presents to the clinic for 3 month follow up.   Overall her pain is controlled on dose that she has been on for years. No problems or concerns.   COPD- she feels like for the last 2 weeks of SOB, cough, chest tightness has been worse. Just going to the mailbox has been difficult. No fever, chills, body aches. She continues to smoke and on symbicort. She has cut back to 6 cigs a day.   HTN- she has been out of lisinopril for a few days. No CP, palpitations, headaches or vision changes. Not checking at home.   GERD- controlled with current medications.   Pt's husband just died about 1 month ago suddenly. She feels "ok" but she really wants to get healthier  .Marland Kitchen Active Ambulatory Problems    Diagnosis Date Noted  . Essential hypertension 11/04/2011  . Surgical menopause 11/04/2011  . Vasomotor flushing 11/18/2011  . Pre-diabetes 11/20/2011  . Personal history of colonic polyps 03/26/2012  . Osteoporosis 11/25/2012  . Hyperlipidemia 11/25/2012  . DDD (degenerative disc disease), lumbar 03/23/2013  . Insomnia 11/25/2013  . COPD bronchitis 02/16/2015  . Simple chronic bronchitis ([place]) 08/27/2016  . Actinic keratosis 11/29/2016  . Chronic pain syndrome 11/29/2016  . Gastroesophageal reflux disease 11/29/2016  . Current smoker 02/26/2017   Resolved Ambulatory Problems    Diagnosis Date Noted  . No Resolved Ambulatory Problems   Past Medical History:  Diagnosis Date  . Cervical dysplasia    .    Review of Systems  All other systems reviewed and are negative.      Objective:   Physical Exam  Constitutional: She is oriented to person, place, and time. She appears well-developed and well-nourished.  HENT:  Head: Normocephalic and atraumatic.  Right Ear: External ear normal.  Left Ear: External ear normal.   Nose: Nose normal.  Mouth/Throat: Oropharynx is clear and moist.  Eyes: Conjunctivae and EOM are normal.  Neck: Normal range of motion.  Cardiovascular: Normal rate and regular rhythm.  Pulmonary/Chest: Effort normal.  Expiratory wheezing and coarse breath sounds.   Lymphadenopathy:    She has no cervical adenopathy.  Neurological: She is alert and oriented to person, place, and time.  Psychiatric: She has a normal mood and affect. Her behavior is normal.          Assessment & Plan:  Marland KitchenMarland KitchenCannie was seen today for follow-up.  Diagnoses and all orders for this visit:  Chronic pain syndrome -     Discontinue: oxyCODONE-acetaminophen (PERCOCET) 10-325 MG tablet; Take 0.5-1 tablets by mouth every 6 (six) hours as needed for pain. -     Pain Mgmt, Profile 6 Conf w/o mM, U -     oxyCODONE-acetaminophen (PERCOCET) 10-325 MG tablet; Take 1 tablet by mouth every 6 (six) hours as needed for pain. -     oxyCODONE-acetaminophen (PERCOCET) 10-325 MG tablet; Take 1 tablet by mouth every 6 (six) hours as needed for pain. -     oxyCODONE-acetaminophen (PERCOCET) 10-325 MG tablet; Take 0.5-1 tablets by mouth every 6 (six) hours as needed for pain.  COPD exacerbation ([place]) -     Fluticasone-Umeclidin-Vilant (TRELEGY ELLIPTA) 100-62.5-25 MCG/INH AEPB; Inhale 1 puff into the lungs daily. -     predniSONE (DELTASONE) 50 MG tablet; Take one  tablet for 5 days.  DDD (degenerative disc disease), lumbar -     Discontinue: oxyCODONE-acetaminophen (PERCOCET) 10-325 MG tablet; Take 0.5-1 tablets by mouth every 6 (six) hours as needed for pain. -     Pain Mgmt, Profile 6 Conf w/o mM, U -     oxyCODONE-acetaminophen (PERCOCET) 10-325 MG tablet; Take 0.5-1 tablets by mouth every 6 (six) hours as needed for pain.  Essential hypertension  Other orders -     Discontinue: predniSONE (DELTASONE) 50 MG tablet; Take one tablet for 5 days. -     Discontinue: oxyCODONE-acetaminophen (PERCOCET) 10-325 MG tablet; Take  1 tablet by mouth every 6 (six) hours as needed for pain. -     Discontinue: oxyCODONE-acetaminophen (PERCOCET) 10-325 MG tablet; Take 1 tablet by mouth every 6 (six) hours as needed for pain.   Chronic pain contract- up to date.  UDS ordered today.  [place] controlled substance database reviewed with no concerns.  3 month of oxycodone given.   HTN- concern with elevation. She has missed some doses. Discussed adding another medication. She had reaction to HCTZ in the past. Likely would add norvasc. Pt would like to keep BP logs at home first. 2 weeks then nurse visit BP follow up.   COPD- seems to be in an exacerbated state today. Prednisone given for 5 days. No signs of infection. Continue other symptomatic management. Stop symbicort and start Trelegy to see if helps lung function better. Discussed smoking cessation. Pt does want to do this. She is cutting back on her own.   Pt does seem to be handling the grief well. Discussed hospice grief counseling. Follow up as needed.   Marland Kitchen.Spent 30 minutes with patient and greater than 50 percent of visit spent counseling patient regarding treatment plan.

## 2017-06-29 LAB — PAIN MGMT, PROFILE 6 CONF W/O MM, U
6 Acetylmorphine: NEGATIVE ng/mL (ref <10)
Alcohol Metabolites: NEGATIVE ng/mL (ref <500)
Amphetamines: NEGATIVE ng/mL (ref <500)
Barbiturates: NEGATIVE ng/mL (ref <300)
Benzodiazepines: NEGATIVE ng/mL (ref <100)
Cocaine Metabolite: NEGATIVE ng/mL (ref <150)
Codeine: NEGATIVE ng/mL (ref <50)
Creatinine: 76.2 mg/dL
Hydrocodone: NEGATIVE ng/mL (ref <50)
Hydromorphone: NEGATIVE ng/mL (ref <50)
Marijuana Metabolite: NEGATIVE ng/mL (ref <20)
Methadone Metabolite: NEGATIVE ng/mL (ref <100)
Morphine: NEGATIVE ng/mL (ref <50)
Norhydrocodone: NEGATIVE ng/mL (ref <50)
Noroxycodone: 4347 ng/mL — ABNORMAL HIGH (ref <50)
Opiates: NEGATIVE ng/mL (ref <100)
Oxidant: NEGATIVE ug/mL (ref <200)
Oxycodone: 628 ng/mL — ABNORMAL HIGH (ref <50)
Oxycodone: POSITIVE ng/mL — AB (ref <100)
Oxymorphone: 588 ng/mL — ABNORMAL HIGH (ref <50)
Phencyclidine: NEGATIVE ng/mL (ref <25)
pH: 9 (ref 4.5–9.0)

## 2017-06-30 DIAGNOSIS — F4321 Adjustment disorder with depressed mood: Secondary | ICD-10-CM | POA: Insufficient documentation

## 2017-07-08 ENCOUNTER — Encounter: Payer: Self-pay | Admitting: Physician Assistant

## 2017-07-30 DIAGNOSIS — H2513 Age-related nuclear cataract, bilateral: Secondary | ICD-10-CM | POA: Diagnosis not present

## 2017-07-30 DIAGNOSIS — H5203 Hypermetropia, bilateral: Secondary | ICD-10-CM | POA: Diagnosis not present

## 2017-09-08 ENCOUNTER — Emergency Department (INDEPENDENT_AMBULATORY_CARE_PROVIDER_SITE_OTHER)
Admission: EM | Admit: 2017-09-08 | Discharge: 2017-09-08 | Disposition: A | Discharge status: Home / Self Care | Payer: BLUE CROSS/BLUE SHIELD | Source: Home / Self Care | Attending: Family Medicine | Admitting: Family Medicine

## 2017-09-08 ENCOUNTER — Encounter: Payer: Self-pay | Admitting: Emergency Medicine

## 2017-09-08 DIAGNOSIS — J209 Acute bronchitis, unspecified: Secondary | ICD-10-CM | POA: Diagnosis not present

## 2017-09-08 DIAGNOSIS — J9801 Acute bronchospasm: Secondary | ICD-10-CM

## 2017-09-08 MED ORDER — LEVOFLOXACIN 500 MG PO TABS: 500.0000 mg | ORAL_TABLET | Freq: Every day | ORAL | 0 refills | Status: AC

## 2017-09-08 MED ORDER — PREDNISONE 20 MG PO TABS: ORAL_TABLET | ORAL | 0 refills | Status: DC

## 2017-09-08 NOTE — ED Provider Notes (Signed)
[facility]    CSN: [id number] Arrival date & time: 09/08/17  1318     History   Chief Complaint Chief Complaint  Patient presents with  . Cough    HPI Sue Smith is a 62 y.o. female.   Patient developed a non-productive cough about two weeks ago.  One week ago she developed fatigue, hoarseness, sinus congestion, and right earache.  During the past 4 days she has been wheezing, and today developed sore throat. She has had pneumonia in the past, and continues to smoke.  The history is provided by the patient.    Past Medical History:  Diagnosis Date  . Cervical dysplasia     Patient Active Problem List   Diagnosis Date Noted  . Grief 06/30/2017  . Current smoker 02/26/2017  . Actinic keratosis 11/29/2016  . Chronic pain syndrome 11/29/2016  . Gastroesophageal reflux disease 11/29/2016  . Simple chronic bronchitis ([place]) 08/27/2016  . COPD bronchitis 02/16/2015  . Insomnia 11/25/2013  . DDD (degenerative disc disease), lumbar 03/23/2013  . Osteoporosis 11/25/2012  . Hyperlipidemia 11/25/2012  . Personal history of colonic polyps 03/26/2012  . Pre-diabetes 11/20/2011  . Vasomotor flushing 11/18/2011  . Essential hypertension 11/04/2011  . Surgical menopause 11/04/2011    Past Surgical History:  Procedure Laterality Date  . ABDOMINAL HYSTERECTOMY    . APPENDECTOMY    . TOTAL ABDOMINAL HYSTERECTOMY W/ BILATERAL SALPINGOOPHORECTOMY      OB History   None      Home Medications    Prior to Admission medications   Medication Sig Start Date End Date Taking? Authorizing Provider  alendronate (FOSAMAX) 70 MG tablet TAKE ONE TABLET BY MOUTH ONCE A WEEK, TAKE WITH A FULL GLASS OF WATER ON AN EMPTY STOMACH 11/26/16   Breeback, Jade L, PA-C  atorvastatin (LIPITOR) 20 MG tablet Take 1 tablet (20 mg total) daily by mouth. 11/26/16   Breeback, Jade L, PA-C  Fluticasone-Umeclidin-Vilant (TRELEGY ELLIPTA) 100-62.5-25 MCG/INH AEPB Inhale 1 puff into  the lungs daily. 06/25/17   Breeback, Jade L, PA-C  hydrOXYzine (ATARAX/VISTARIL) 25 MG tablet Take 1 tablet (25 mg total) by mouth 3 (three) times daily as needed. 05/14/17   Breeback, Jade L, PA-C  levofloxacin (LEVAQUIN) 500 MG tablet Take 1 tablet (500 mg total) by mouth daily for 7 doses. 09/08/17 09/15/17  Kandra Nicolas, MD  lisinopril (PRINIVIL,ZESTRIL) 20 MG tablet Take 2 tablets (40 mg total) daily by mouth. 11/26/16   Breeback, Jade L, PA-C  oxyCODONE-acetaminophen (PERCOCET) 10-325 MG tablet Take 1 tablet by mouth every 6 (six) hours as needed for pain. 07/25/17   Breeback, Royetta Car, PA-C  oxyCODONE-acetaminophen (PERCOCET) 10-325 MG tablet Take 1 tablet by mouth every 6 (six) hours as needed for pain. 08/25/17   Breeback, Royetta Car, PA-C  oxyCODONE-acetaminophen (PERCOCET) 10-325 MG tablet Take 0.5-1 tablets by mouth every 6 (six) hours as needed for pain. 06/25/17   Breeback, Jade L, PA-C  predniSONE (DELTASONE) 20 MG tablet Take one tab by mouth twice daily for 4 days, then one daily. Take with food. 09/08/17   Kandra Nicolas, MD  ranitidine (ZANTAC) 150 MG tablet Take 1 tablet (150 mg total) 2 (two) times daily by mouth. 11/26/16   Donella Stade, PA-C    Family History Family History  Problem Relation Age of Onset  . Cancer Mother   . Heart failure Father     Social History Social History   Tobacco Use  . Smoking status: Current  Every Day Smoker    Packs/day: 0.50    Types: Cigarettes  . Smokeless tobacco: Never Used  Substance Use Topics  . Alcohol use: Yes    Alcohol/week: 2.0 standard drinks    Types: 2 Standard drinks or equivalent per week  . Drug use: No     Allergies   Erythromycin; Ampicillin; Chantix [varenicline]; Doxycycline; Keflex [cephalexin]; Penicillins; Sulfa antibiotics; Tetracyclines & related; and Hctz [hydrochlorothiazide]   Review of Systems Review of Systems + sore throat + hoarse + cough No pleuritic pain + wheezing + nasal congestion +  post-nasal drainage + sinus pain/pressure No itchy/red eyes + earache No hemoptysis No SOB No fever, + chills No nausea No vomiting No abdominal pain No diarrhea No urinary symptoms No skin rash + fatigue No myalgias No headache Used OTC meds without relief   Physical Exam Triage Vital Signs ED Triage Vitals  Enc Vitals Group     BP      Pulse      Resp      Temp      Temp src      SpO2      Weight      Height      Head Circumference      Peak Flow      Pain Score      Pain Loc      Pain Edu?      Excl. in [place]?    No data found.  Updated Vital Signs BP (!) 146/83 (BP Location: Right Arm)   Pulse 76   Temp 98.5 F (36.9 C) (Oral)   Wt 61.7 kg   SpO2 96%   BMI 21.30 kg/m   Visual Acuity Right Eye Distance:   Left Eye Distance:   Bilateral Distance:    Right Eye Near:   Left Eye Near:    Bilateral Near:     Physical Exam Nursing notes and Vital Signs reviewed. Appearance:  Patient appears stated age, and in no acute distress Eyes:  Pupils are equal, round, and reactive to light and accomodation.  Extraocular movement is intact.  Conjunctivae are not inflamed  Ears:  Canals normal.  Tympanic membranes normal.  Nose:  Mildly congested turbinates.  No sinus tenderness.   Pharynx:  Normal Neck:  Supple.  Enlarged posterior/lateral nodes are palpated bilaterally, tender to palpation on the left.   Lungs:   Course breath sounds.  Breath sounds are equal.  Moving air well. Heart:  Regular rate and rhythm without murmurs, rubs, or gallops.  Abdomen:  Nontender without masses or hepatosplenomegaly.  Bowel sounds are present.  No CVA or flank tenderness.  Extremities:  No edema.  Skin:  No rash present.    UC Treatments / Results  Labs (all labs ordered are listed, but only abnormal results are displayed) Labs Reviewed - No data to display  EKG None  Radiology No results found.  Procedures Procedures (including critical care time)  Medications  Ordered in UC Medications - No data to display  Initial Impression / Assessment and Plan / UC Course  I have reviewed the triage vital signs and the nursing notes.  Pertinent labs & imaging results that were available during my care of the patient were reviewed by me and considered in my medical decision making (see chart for details).    Patient has multiple antibiotic allergies; will Bbgin Levaquin, and prednisone burst/taper. Followup with Family Doctor if not improved in one week.    Final  Clinical Impressions(s) / UC Diagnoses   Final diagnoses:  Acute bronchitis, unspecified organism  Bronchospasm     Discharge Instructions     Take plain guaifenesin (1200mg  extended release tabs such as Mucinex) twice daily, with plenty of water, for cough and congestion.   May use Afrin nasal spray (or generic oxymetazoline) each morning for about 5 days and then discontinue.  Also recommend using saline nasal spray several times daily and saline nasal irrigation (AYR is a common brand).   May take Delsym Cough Suppressant at bedtime for nighttime cough.  Try warm salt water gargles for sore throat.  Stop all antihistamines for now, and other non-prescription cough/cold preparations.      ED Prescriptions    Medication Sig Dispense Auth. Provider   levofloxacin (LEVAQUIN) 500 MG tablet Take 1 tablet (500 mg total) by mouth daily for 7 doses. 7 tablet Kandra Nicolas, MD   predniSONE (DELTASONE) 20 MG tablet Take one tab by mouth twice daily for 4 days, then one daily. Take with food. 12 tablet Kandra Nicolas, MD        Kandra Nicolas, MD 09/10/17 [phone]

## 2017-09-08 NOTE — ED Triage Notes (Signed)
Pt c/o ear pain, cough, sore throat and  runny nose x2 days. Afebrile.

## 2017-09-08 NOTE — Discharge Instructions (Addendum)
Take plain guaifenesin (1200mg  extended release tabs such as Mucinex) twice daily, with plenty of water, for cough and congestion.   May use Afrin nasal spray (or generic oxymetazoline) each morning for about 5 days and then discontinue.  Also recommend using saline nasal spray several times daily and saline nasal irrigation (AYR is a common brand).   May take Delsym Cough Suppressant at bedtime for nighttime cough.  Try warm salt water gargles for sore throat.  Stop all antihistamines for now, and other non-prescription cough/cold preparations.

## 2017-09-09 ENCOUNTER — Ambulatory Visit: Payer: BLUE CROSS/BLUE SHIELD | Admitting: Family Medicine

## 2017-09-18 ENCOUNTER — Other Ambulatory Visit: Payer: Self-pay | Admitting: Physician Assistant

## 2017-09-18 DIAGNOSIS — J441 Chronic obstructive pulmonary disease with (acute) exacerbation: Secondary | ICD-10-CM

## 2017-09-30 ENCOUNTER — Encounter: Payer: Self-pay | Admitting: Physician Assistant

## 2017-09-30 ENCOUNTER — Ambulatory Visit: Payer: BLUE CROSS/BLUE SHIELD | Admitting: Physician Assistant

## 2017-09-30 VITALS — BP 128/67 | HR 61 | Ht 67.0 in | Wt 135.0 lb

## 2017-09-30 DIAGNOSIS — I10 Essential (primary) hypertension: Secondary | ICD-10-CM

## 2017-09-30 DIAGNOSIS — G894 Chronic pain syndrome: Secondary | ICD-10-CM

## 2017-09-30 DIAGNOSIS — J410 Simple chronic bronchitis: Secondary | ICD-10-CM | POA: Diagnosis not present

## 2017-09-30 DIAGNOSIS — M5136 Other intervertebral disc degeneration, lumbar region: Secondary | ICD-10-CM

## 2017-09-30 DIAGNOSIS — Z23 Encounter for immunization: Secondary | ICD-10-CM

## 2017-09-30 DIAGNOSIS — F17200 Nicotine dependence, unspecified, uncomplicated: Secondary | ICD-10-CM

## 2017-09-30 DIAGNOSIS — M51369 Other intervertebral disc degeneration, lumbar region without mention of lumbar back pain or lower extremity pain: Secondary | ICD-10-CM

## 2017-09-30 MED ORDER — OXYCODONE-ACETAMINOPHEN 10-325 MG PO TABS: 1.0000 | ORAL_TABLET | Freq: Four times a day (QID) | ORAL | 0 refills | Status: DC | PRN

## 2017-09-30 MED ORDER — OXYCODONE-ACETAMINOPHEN 10-325 MG PO TABS: 0.5000 | ORAL_TABLET | Freq: Four times a day (QID) | ORAL | 0 refills | Status: DC | PRN

## 2017-09-30 MED ORDER — LISINOPRIL 20 MG PO TABS: 40.0000 mg | ORAL_TABLET | Freq: Every day | ORAL | 3 refills | Status: DC

## 2017-09-30 NOTE — Progress Notes (Signed)
Subjective:    Patient ID: Sue Smith, female    DOB: Jul 25, 1955, 62 y.o.   MRN: [id number]  HPI  Pt is a 62 yo female with COPD, HTN, chronic pain who presents to the clinic for UC follow up on bronchitis/copd exacerbation and medication refill.   Pt was seen in ED on 8/26 she was given levaquin and prednisone. She is much better but still has some sinus pressure. She is using mucinex. No fever, chills. She has a little productive cough. She continues to smoke. She has cut back. She is on trelegy.   Pain is controled with oxycodone. She does report a pharmacy switch from contract due to pharmacy shutting down.   HTN- denies any CP, palpitations, headaches or vision changes.   .. Active Ambulatory Problems    Diagnosis Date Noted  . Essential hypertension 11/04/2011  . Surgical menopause 11/04/2011  . Vasomotor flushing 11/18/2011  . Pre-diabetes 11/20/2011  . Personal history of colonic polyps 03/26/2012  . Osteoporosis 11/25/2012  . Hyperlipidemia 11/25/2012  . DDD (degenerative disc disease), lumbar 03/23/2013  . Insomnia 11/25/2013  . COPD bronchitis 02/16/2015  . Simple chronic bronchitis ([place]) 08/27/2016  . Actinic keratosis 11/29/2016  . Chronic pain syndrome 11/29/2016  . Gastroesophageal reflux disease 11/29/2016  . Current smoker 02/26/2017  . Grief 06/30/2017   Resolved Ambulatory Problems    Diagnosis Date Noted  . No Resolved Ambulatory Problems   Past Medical History:  Diagnosis Date  . Cervical dysplasia        Review of Systems    see HPI.  Objective:   Physical Exam  Constitutional: She is oriented to person, place, and time. She appears well-developed and well-nourished.  HENT:  Head: Normocephalic and atraumatic.  Right Ear: External ear normal.  Left Ear: External ear normal.  Nose: Nose normal.  Mouth/Throat: Oropharynx is clear and moist.  Eyes: Conjunctivae and EOM are normal. Right eye exhibits no discharge. Left eye exhibits no  discharge.  Cardiovascular: Normal rate and regular rhythm.  Pulmonary/Chest: Effort normal.  Coarse breath sounds.   Lymphadenopathy:    She has no cervical adenopathy.  Neurological: She is alert and oriented to person, place, and time.  Psychiatric: She has a normal mood and affect. Her behavior is normal.          Assessment & Plan:  Marland KitchenMarland KitchenDiagnoses and all orders for this visit:  Simple chronic bronchitis ([place]) -     Ambulatory Referral for Lung Cancer Scre  Essential hypertension -     lisinopril (PRINIVIL,ZESTRIL) 20 MG tablet; Take 2 tablets (40 mg total) by mouth daily.  Chronic pain syndrome -     oxyCODONE-acetaminophen (PERCOCET) 10-325 MG tablet; Take 1 tablet by mouth every 6 (six) hours as needed for pain. -     oxyCODONE-acetaminophen (PERCOCET) 10-325 MG tablet; Take 1 tablet by mouth every 6 (six) hours as needed for pain. -     oxyCODONE-acetaminophen (PERCOCET) 10-325 MG tablet; Take 0.5-1 tablets by mouth every 6 (six) hours as needed for pain.  DDD (degenerative disc disease), lumbar -     oxyCODONE-acetaminophen (PERCOCET) 10-325 MG tablet; Take 0.5-1 tablets by mouth every 6 (six) hours as needed for pain.  Need for immunization against influenza -     Flu Vaccine QUAD 36+ mos IM  Current smoker -     Ambulatory Referral for Lung Cancer Scre   Reassured PE looked good today. Continue with mucinex add flonase. Follow up if not  continuing to get better or getting worse.  CT lung cancer screening ordered due to new insurance.  Pt is cutting back on smoking.  Flu shot given today.   BP looks great. Refilled meds.   Oxycodone refilled.  Pain contracted updated today.  UDS within the year.  Follow up in 3 months.  [place] controlled substance database reviewed with no concerns.

## 2017-10-02 ENCOUNTER — Encounter: Payer: Self-pay | Admitting: Physician Assistant

## 2017-11-04 ENCOUNTER — Encounter: Payer: Self-pay | Admitting: Physician Assistant

## 2017-11-04 ENCOUNTER — Ambulatory Visit (INDEPENDENT_AMBULATORY_CARE_PROVIDER_SITE_OTHER): Payer: BLUE CROSS/BLUE SHIELD | Admitting: Physician Assistant

## 2017-11-04 VITALS — BP 136/73 | HR 87 | Temp 98.9°F | Ht 67.0 in | Wt 133.0 lb

## 2017-11-04 DIAGNOSIS — J441 Chronic obstructive pulmonary disease with (acute) exacerbation: Secondary | ICD-10-CM | POA: Diagnosis not present

## 2017-11-04 MED ORDER — PREDNISONE 50 MG PO TABS: ORAL_TABLET | ORAL | 0 refills | Status: DC

## 2017-11-04 MED ORDER — LEVOFLOXACIN 500 MG PO TABS: 500.0000 mg | ORAL_TABLET | Freq: Every day | ORAL | 0 refills | Status: DC

## 2017-11-04 MED ORDER — ALBUTEROL SULFATE HFA 108 (90 BASE) MCG/ACT IN AERS: 2.0000 | INHALATION_SPRAY | Freq: Four times a day (QID) | RESPIRATORY_TRACT | 2 refills | Status: DC | PRN

## 2017-11-04 NOTE — Patient Instructions (Signed)
Chronic Obstructive Pulmonary Disease Exacerbation  Chronic obstructive pulmonary disease (COPD) is a common lung problem. In COPD, the flow of air from the lungs is limited. COPD exacerbations are times that breathing gets worse and you need extra treatment. Without treatment they can be life threatening. If they happen often, your lungs can become more damaged. If your COPD gets worse, your doctor may treat you with:  ? Medicines.  ? Oxygen.  ? Different ways to clear your airway, such as using a mask.    Follow these instructions at home:  ? Do not smoke.  ? Avoid tobacco smoke and other things that bother your lungs.  ? If given, take your antibiotic medicine as told. Finish the medicine even if you start to feel better.  ? Only take medicines as told by your doctor.  ? Drink enough fluids to keep your pee (urine) clear or pale yellow (unless your doctor has told you not to).  ? Use a cool mist machine (vaporizer).  ? If you use oxygen or a machine that turns liquid medicine into a mist (nebulizer), continue to use them as told.  ? Keep up with shots (vaccinations) as told by your doctor.  ? Exercise regularly.  ? Eat healthy foods.  ? Keep all doctor visits as told.  Get help right away if:  ? You are very short of breath and it gets worse.  ? You have trouble talking.  ? You have bad chest pain.  ? You have blood in your spit (sputum).  ? You have a fever.  ? You keep throwing up (vomiting).  ? You feel weak, or you pass out (faint).  ? You feel confused.  ? You keep getting worse.  This information is not intended to replace advice given to you by your health care provider. Make sure you discuss any questions you have with your health care provider.  Document Released: 12/20/2010 Document Revised: 06/08/2015 Document Reviewed: 09/04/2012  Elsevier Interactive Patient Education ? 2017 Elsevier Inc[employer]

## 2017-11-04 NOTE — Progress Notes (Signed)
   Subjective:    Patient ID: Sue Smith, female    DOB: 1955-06-25, 62 y.o.   MRN: [id number]  HPI  Pt is a 62 yo female with COPD and current smoker who presents to the clinic with cough, chest tightness, body aches, chills for the last 5 days. She has been sleeping a lot over last 5 days as well. She does not have rescue inhaler right now. She uses trelegy daily. She has ran a fever around 100.6 over the weekend. Denies any sinus pressure. She is coughing up thick clear discharge. mucinex has helped some.   .. Active Ambulatory Problems    Diagnosis Date Noted  . Essential hypertension 11/04/2011  . Surgical menopause 11/04/2011  . Vasomotor flushing 11/18/2011  . Pre-diabetes 11/20/2011  . Personal history of colonic polyps 03/26/2012  . Osteoporosis 11/25/2012  . Hyperlipidemia 11/25/2012  . DDD (degenerative disc disease), lumbar 03/23/2013  . Insomnia 11/25/2013  . COPD bronchitis 02/16/2015  . Simple chronic bronchitis ([place]) 08/27/2016  . Actinic keratosis 11/29/2016  . Chronic pain syndrome 11/29/2016  . Gastroesophageal reflux disease 11/29/2016  . Current smoker 02/26/2017  . Grief 06/30/2017   Resolved Ambulatory Problems    Diagnosis Date Noted  . No Resolved Ambulatory Problems   Past Medical History:  Diagnosis Date  . Cervical dysplasia       Review of Systems See HPI.     Objective:   Physical Exam  Constitutional: She is oriented to person, place, and time. She appears well-developed and well-nourished.  HENT:  Head: Normocephalic and atraumatic.  Right Ear: External ear normal.  Left Ear: External ear normal.  Nose: Nose normal.  Mouth/Throat: Oropharynx is clear and moist.  TM"s clear.  Negative for any sinus tenderness to palpation.    Eyes: Conjunctivae and EOM are normal.  Cardiovascular: Normal rate and regular rhythm.  Pulmonary/Chest: Effort normal.  Coarse breath sounds.  No wheezing.  Scattered rhonchi diffusely but increased  at apex bilaterally.   Lymphadenopathy:    She has no cervical adenopathy.  Neurological: She is alert and oriented to person, place, and time.  Skin: No rash noted.  Psychiatric: She has a normal mood and affect. Her behavior is normal.          Assessment & Plan:  Marland KitchenMarland KitchenDiagnoses and all orders for this visit:  COPD exacerbation ([place]) -     albuterol (PROVENTIL HFA;VENTOLIN HFA) 108 (90 Base) MCG/ACT inhaler; Inhale 2 puffs into the lungs every 6 (six) hours as needed for wheezing. -     predniSONE (DELTASONE) 50 MG tablet; Take one tablet for 5 days. -     levofloxacin (LEVAQUIN) 500 MG tablet; Take 1 tablet (500 mg total) by mouth daily. For 7 days.   Reassurance given that her lungs sound pretty good. There were some coarse breath sounds and scattered rhonchi. Rescue inhaler given to use PRN. Start prednisone burst. abx given to use only if symptoms worsen. Continue mucinex. Stay hydrated and continue to rest.   Pt continues to decrease smoking but has not stopped. She declines any medication assistance.

## 2017-11-10 ENCOUNTER — Encounter: Payer: Self-pay | Admitting: Physician Assistant

## 2017-11-11 MED ORDER — PREDNISONE 20 MG PO TABS: ORAL_TABLET | ORAL | 0 refills | Status: DC

## 2017-12-27 ENCOUNTER — Other Ambulatory Visit: Payer: Self-pay | Admitting: Physician Assistant

## 2017-12-27 DIAGNOSIS — J441 Chronic obstructive pulmonary disease with (acute) exacerbation: Secondary | ICD-10-CM

## 2017-12-29 ENCOUNTER — Other Ambulatory Visit: Payer: Self-pay | Admitting: Physician Assistant

## 2017-12-29 DIAGNOSIS — Z1231 Encounter for screening mammogram for malignant neoplasm of breast: Secondary | ICD-10-CM

## 2018-01-09 ENCOUNTER — Ambulatory Visit (INDEPENDENT_AMBULATORY_CARE_PROVIDER_SITE_OTHER): Payer: BLUE CROSS/BLUE SHIELD

## 2018-01-09 DIAGNOSIS — Z1231 Encounter for screening mammogram for malignant neoplasm of breast: Secondary | ICD-10-CM | POA: Diagnosis not present

## 2018-01-12 ENCOUNTER — Telehealth: Payer: Self-pay

## 2018-01-12 DIAGNOSIS — Z13220 Encounter for screening for lipoid disorders: Secondary | ICD-10-CM

## 2018-01-12 DIAGNOSIS — I10 Essential (primary) hypertension: Secondary | ICD-10-CM

## 2018-01-12 NOTE — Telephone Encounter (Signed)
Patient called in need of lab work being ordered so patient can have labs done before her scheduled physical tomorrow. Labs have been ordered per PCP and were faxed downstairs to lab. No further questions or concerns at this time.

## 2018-01-12 NOTE — Progress Notes (Signed)
Call pt: normal mammogram. Follow up in 1 year.

## 2018-01-13 ENCOUNTER — Encounter: Payer: Self-pay | Admitting: Physician Assistant

## 2018-01-13 ENCOUNTER — Ambulatory Visit (INDEPENDENT_AMBULATORY_CARE_PROVIDER_SITE_OTHER): Payer: BLUE CROSS/BLUE SHIELD | Admitting: Physician Assistant

## 2018-01-13 VITALS — BP 128/71 | HR 78 | Temp 97.9°F | Ht 67.0 in | Wt 130.0 lb

## 2018-01-13 DIAGNOSIS — E7800 Pure hypercholesterolemia, unspecified: Secondary | ICD-10-CM

## 2018-01-13 DIAGNOSIS — I10 Essential (primary) hypertension: Secondary | ICD-10-CM

## 2018-01-13 DIAGNOSIS — Z0000 Encounter for general adult medical examination without abnormal findings: Secondary | ICD-10-CM | POA: Diagnosis not present

## 2018-01-13 DIAGNOSIS — M5136 Other intervertebral disc degeneration, lumbar region: Secondary | ICD-10-CM

## 2018-01-13 DIAGNOSIS — M810 Age-related osteoporosis without current pathological fracture: Secondary | ICD-10-CM

## 2018-01-13 DIAGNOSIS — J441 Chronic obstructive pulmonary disease with (acute) exacerbation: Secondary | ICD-10-CM

## 2018-01-13 DIAGNOSIS — Z13220 Encounter for screening for lipoid disorders: Secondary | ICD-10-CM | POA: Diagnosis not present

## 2018-01-13 DIAGNOSIS — G894 Chronic pain syndrome: Secondary | ICD-10-CM

## 2018-01-13 LAB — COMPLETE METABOLIC PANEL WITH GFR
AG RATIO: 1.7 (calc) (ref 1.0–2.5)
ALT: 9 U/L (ref 6–29)
AST: 10 U/L (ref 10–35)
Albumin: 4.4 g/dL (ref 3.6–5.1)
Alkaline phosphatase (APISO): 78 U/L (ref 33–130)
BUN/Creatinine Ratio: 11 (calc) (ref 6–22)
BUN: 11 mg/dL (ref 7–25)
CO2: 30 mmol/L (ref 20–32)
Calcium: 10.1 mg/dL (ref 8.6–10.4)
Chloride: 106 mmol/L (ref 98–110)
Creat: 1 mg/dL — ABNORMAL HIGH (ref 0.50–0.99)
GFR, EST NON AFRICAN AMERICAN: 61 mL/min/{1.73_m2} (ref >=60)
GFR, Est African American: 71 mL/min/{1.73_m2} (ref >=60)
Globulin: 2.6 g/dL (calc) (ref 1.9–3.7)
Glucose, Bld: 95 mg/dL (ref 65–99)
POTASSIUM: 4.3 mmol/L (ref 3.5–5.3)
Sodium: 142 mmol/L (ref 135–146)
Total Bilirubin: 0.3 mg/dL (ref 0.2–1.2)
Total Protein: 7 g/dL (ref 6.1–8.1)

## 2018-01-13 LAB — LIPID PANEL
Cholesterol: 158 mg/dL (ref <200)
HDL: 52 mg/dL (ref >50)
LDL Cholesterol (Calc): 87 mg/dL (calc)
Non-HDL Cholesterol (Calc): 106 mg/dL (calc) (ref <130)
Total CHOL/HDL Ratio: 3 (calc) (ref <5.0)
Triglycerides: 101 mg/dL (ref <150)

## 2018-01-13 MED ORDER — OXYCODONE-ACETAMINOPHEN 10-325 MG PO TABS: 0.5000 | ORAL_TABLET | Freq: Four times a day (QID) | ORAL | 0 refills | Status: DC | PRN

## 2018-01-13 MED ORDER — PREDNISONE 50 MG PO TABS: ORAL_TABLET | ORAL | 0 refills | Status: DC

## 2018-01-13 MED ORDER — FLUTICASONE-UMECLIDIN-VILANT 100-62.5-25 MCG/INH IN AEPB: 1.0000 | INHALATION_SPRAY | Freq: Every day | RESPIRATORY_TRACT | 11 refills | Status: DC

## 2018-01-13 MED ORDER — OXYCODONE-ACETAMINOPHEN 10-325 MG PO TABS: 1.0000 | ORAL_TABLET | Freq: Four times a day (QID) | ORAL | 0 refills | Status: DC | PRN

## 2018-01-13 MED ORDER — ATORVASTATIN CALCIUM 20 MG PO TABS: 20.0000 mg | ORAL_TABLET | Freq: Every day | ORAL | 3 refills | Status: DC

## 2018-01-13 MED ORDER — LISINOPRIL 20 MG PO TABS: 40.0000 mg | ORAL_TABLET | Freq: Every day | ORAL | 3 refills | Status: DC

## 2018-01-13 MED ORDER — ALENDRONATE SODIUM 70 MG PO TABS: ORAL_TABLET | ORAL | 3 refills | Status: DC

## 2018-01-13 NOTE — Patient Instructions (Signed)
Health Maintenance for Postmenopausal Women Menopause is a normal process in which your reproductive ability comes to an end. This process happens gradually over a span of months to years, usually between the ages of 62 and 89. Menopause is complete when you have missed 12 consecutive menstrual periods. It is important to talk with your health care provider about some of the most common conditions that affect postmenopausal women, such as heart disease, cancer, and bone loss (osteoporosis). Adopting a healthy lifestyle and getting preventive care can help to promote your health and wellness. Those actions can also lower your chances of developing some of these common conditions. What should I know about menopause? During menopause, you may experience a number of symptoms, such as:  Moderate-to-severe hot flashes.  Night sweats.  Decrease in sex drive.  Mood swings.  Headaches.  Tiredness.  Irritability.  Memory problems.  Insomnia. Choosing to treat or not to treat menopausal changes is an individual decision that you make with your health care provider. What should I know about hormone replacement therapy and supplements? Hormone therapy products are effective for treating symptoms that are associated with menopause, such as hot flashes and night sweats. Hormone replacement carries certain risks, especially as you become older. If you are thinking about using estrogen or estrogen with progestin treatments, discuss the benefits and risks with your health care provider. What should I know about heart disease and stroke? Heart disease, heart attack, and stroke become more likely as you age. This may be due, in part, to the hormonal changes that your body experiences during menopause. These can affect how your body processes dietary fats, triglycerides, and cholesterol. Heart attack and stroke are both medical emergencies. There are many things that you can do to help prevent heart disease  and stroke:  Have your blood pressure checked at least every 1-2 years. High blood pressure causes heart disease and increases the risk of stroke.  If you are 79-72 years old, ask your health care provider if you should take aspirin to prevent a heart attack or a stroke.  Do not use any tobacco products, including cigarettes, chewing tobacco, or electronic cigarettes. If you need help quitting, ask your health care provider.  It is important to eat a healthy diet and maintain a healthy weight. ? Be sure to include plenty of vegetables, fruits, low-fat dairy products, and lean protein. ? Avoid eating foods that are high in solid fats, added sugars, or salt (sodium).  Get regular exercise. This is one of the most important things that you can do for your health. ? Try to exercise for at least 150 minutes each week. The type of exercise that you do should increase your heart rate and make you sweat. This is known as moderate-intensity exercise. ? Try to do strengthening exercises at least twice each week. Do these in addition to the moderate-intensity exercise.  Know your numbers.Ask your health care provider to check your cholesterol and your blood glucose. Continue to have your blood tested as directed by your health care provider.  What should I know about cancer screening? There are several types of cancer. Take the following steps to reduce your risk and to catch any cancer development as early as possible. Breast Cancer  Practice breast self-awareness. ? This means understanding how your breasts normally appear and feel. ? It also means doing regular breast self-exams. Let your health care provider know about any changes, no matter how small.  If you are 40 or  older, have a clinician do a breast exam (clinical breast exam or CBE) every year. Depending on your age, family history, and medical history, it may be recommended that you also have a yearly breast X-ray (mammogram).  If you  have a family history of breast cancer, talk with your health care provider about genetic screening.  If you are at high risk for breast cancer, talk with your health care provider about having an MRI and a mammogram every year.  Breast cancer (BRCA) gene test is recommended for women who have family members with BRCA-related cancers. Results of the assessment will determine the need for genetic counseling and BRCA1 and for BRCA2 testing. BRCA-related cancers include these types: ? Breast. This occurs in males or females. ? Ovarian. ? Tubal. This may also be called fallopian tube cancer. ? Cancer of the abdominal or pelvic lining (peritoneal cancer). ? Prostate. ? Pancreatic. Cervical, Uterine, and Ovarian Cancer Your health care provider may recommend that you be screened regularly for cancer of the pelvic organs. These include your ovaries, uterus, and vagina. This screening involves a pelvic exam, which includes checking for microscopic changes to the surface of your cervix (Pap test).  For women ages 21-65, health care providers may recommend a pelvic exam and a Pap test every three years. For women ages 39-65, they may recommend the Pap test and pelvic exam, combined with testing for human papilloma virus (HPV), every five years. Some types of HPV increase your risk of cervical cancer. Testing for HPV may also be done on women of any age who have unclear Pap test results.  Other health care providers may not recommend any screening for nonpregnant women who are considered low risk for pelvic cancer and have no symptoms. Ask your health care provider if a screening pelvic exam is right for you.  If you have had past treatment for cervical cancer or a condition that could lead to cancer, you need Pap tests and screening for cancer for at least 20 years after your treatment. If Pap tests have been discontinued for you, your risk factors (such as having a new sexual partner) need to be reassessed  to determine if you should start having screenings again. Some women have medical problems that increase the chance of getting cervical cancer. In these cases, your health care provider may recommend that you have screening and Pap tests more often.  If you have a family history of uterine cancer or ovarian cancer, talk with your health care provider about genetic screening.  If you have vaginal bleeding after reaching menopause, tell your health care provider.  There are currently no reliable tests available to screen for ovarian cancer. Lung Cancer Lung cancer screening is recommended for adults 57-50 years old who are at high risk for lung cancer because of a history of smoking. A yearly low-dose CT scan of the lungs is recommended if you:  Currently smoke.  Have a history of at least 30 pack-years of smoking and you currently smoke or have quit within the past 15 years. A pack-year is smoking an average of one pack of cigarettes per day for one year. Yearly screening should:  Continue until it has been 15 years since you quit.  Stop if you develop a health problem that would prevent you from having lung cancer treatment. Colorectal Cancer  This type of cancer can be detected and can often be prevented.  Routine colorectal cancer screening usually begins at age 12 and continues through  age 75.  If you have risk factors for colon cancer, your health care provider may recommend that you be screened at an earlier age.  If you have a family history of colorectal cancer, talk with your health care provider about genetic screening.  Your health care provider may also recommend using home test kits to check for hidden blood in your stool.  A small camera at the end of a tube can be used to examine your colon directly (sigmoidoscopy or colonoscopy). This is done to check for the earliest forms of colorectal cancer.  Direct examination of the colon should be repeated every 5-10 years until  age 75. However, if early forms of precancerous polyps or small growths are found or if you have a family history or genetic risk for colorectal cancer, you may need to be screened more often. Skin Cancer  Check your skin from head to toe regularly.  Monitor any moles. Be sure to tell your health care provider: ? About any new moles or changes in moles, especially if there is a change in a mole's shape or color. ? If you have a mole that is larger than the size of a pencil eraser.  If any of your family members has a history of skin cancer, especially at a young age, talk with your health care provider about genetic screening.  Always use sunscreen. Apply sunscreen liberally and repeatedly throughout the day.  Whenever you are outside, protect yourself by wearing long sleeves, pants, a wide-brimmed hat, and sunglasses. What should I know about osteoporosis? Osteoporosis is a condition in which bone destruction happens more quickly than new bone creation. After menopause, you may be at an increased risk for osteoporosis. To help prevent osteoporosis or the bone fractures that can happen because of osteoporosis, the following is recommended:  If you are 19-50 years old, get at least 1,000 mg of calcium and at least 600 mg of vitamin D per day.  If you are older than age 50 but younger than age 70, get at least 1,200 mg of calcium and at least 600 mg of vitamin D per day.  If you are older than age 70, get at least 1,200 mg of calcium and at least 800 mg of vitamin D per day. Smoking and excessive alcohol intake increase the risk of osteoporosis. Eat foods that are rich in calcium and vitamin D, and do weight-bearing exercises several times each week as directed by your health care provider. What should I know about how menopause affects my mental health? Depression may occur at any age, but it is more common as you become older. Common symptoms of depression include:  Low or sad  mood.  Changes in sleep patterns.  Changes in appetite or eating patterns.  Feeling an overall lack of motivation or enjoyment of activities that you previously enjoyed.  Frequent crying spells. Talk with your health care provider if you think that you are experiencing depression. What should I know about immunizations? It is important that you get and maintain your immunizations. These include:  Tetanus, diphtheria, and pertussis (Tdap) booster vaccine.  Influenza every year before the flu season begins.  Pneumonia vaccine.  Shingles vaccine. Your health care provider may also recommend other immunizations. This information is not intended to replace advice given to you by your health care provider. Make sure you discuss any questions you have with your health care provider. Document Released: 02/22/2005 Document Revised: 07/21/2015 Document Reviewed: 10/04/2014 Elsevier Interactive Patient Education    2019 [place].

## 2018-01-13 NOTE — Progress Notes (Signed)
Subjective:     Sue Smith is a 62 y.o. female and is here for a comprehensive physical exam. The patient reports problems - pt has had productive cough and congestion for a few weeks. she does smoke. she denies any fever, chills, body aches. she does have COPD and on trelegy. she has rescue inhaler and does help some. .  Pt does need her 3 month pain medication refill.   Social History   Socioeconomic History  . Marital status: Widowed    Spouse name: Not on file  . Number of children: Not on file  . Years of education: Not on file  . Highest education level: Not on file  Occupational History  . Not on file  Social Needs  . Financial resource strain: Not on file  . Food insecurity:    Worry: Not on file    Inability: Not on file  . Transportation needs:    Medical: Not on file    Non-medical: Not on file  Tobacco Use  . Smoking status: Current Every Day Smoker    Packs/day: 0.50    Types: Cigarettes  . Smokeless tobacco: Never Used  Substance and Sexual Activity  . Alcohol use: Yes    Alcohol/week: 2.0 standard drinks    Types: 2 Standard drinks or equivalent per week  . Drug use: No  . Sexual activity: Not on file  Lifestyle  . Physical activity:    Days per week: Not on file    Minutes per session: Not on file  . Stress: Not on file  Relationships  . Social connections:    Talks on phone: Not on file    Gets together: Not on file    Attends religious service: Not on file    Active member of club or organization: Not on file    Attends meetings of clubs or organizations: Not on file    Relationship status: Not on file  . Intimate partner violence:    Fear of current or ex partner: Not on file    Emotionally abused: Not on file    Physically abused: Not on file    Forced sexual activity: Not on file  Other Topics Concern  . Not on file  Social History Narrative  . Not on file   Health Maintenance  Topic Date Due  . TETANUS/TDAP  02/25/2018  (Originally 01/14/2017)  . HIV Screening  06/26/2018 (Originally 07/26/1972)  . PAP SMEAR-Modifier  11/30/2045 (Originally 07/27/1978)  . DEXA SCAN  01/09/2019  . MAMMOGRAM  01/10/2020  . COLONOSCOPY  07/01/2022  . INFLUENZA VACCINE  Completed  . Hepatitis C Screening  Completed    The following portions of the patient's history were reviewed and updated as appropriate: allergies, current medications, past family history, past medical history, past social history, past surgical history and problem list.  Review of Systems Pertinent items noted in HPI and remainder of comprehensive ROS otherwise negative.   Objective:    BP 128/71   Pulse 78   Temp 97.9 F (36.6 C) (Oral)   Ht 5\' 7"  (1.702 m)   Wt 130 lb (59 kg)   SpO2 98%   BMI 20.36 kg/m  General appearance: alert, cooperative and appears stated age Head: Normocephalic, without obvious abnormality, atraumatic Eyes: conjunctivae/corneas clear. PERRL, EOM's intact. Fundi benign. Ears: normal TM's and external ear canals both ears Nose: Nares normal. Septum midline. Mucosa normal. No drainage or sinus tenderness. Throat: lips, mucosa, and tongue normal; teeth and  gums normal Neck: no adenopathy, no carotid bruit, no JVD, supple, symmetrical, trachea midline and thyroid not enlarged, symmetric, no tenderness/mass/nodules Back: symmetric, no curvature. ROM normal. No CVA tenderness. Lungs: coarse breath sounds. scattered rhochi upper bilateral lungs. mostly cleared with cough.  Heart: regular rate and rhythm, S1, S2 normal, no murmur, click, rub or gallop Abdomen: soft, non-tender; bowel sounds normal; no masses,  no organomegaly Extremities: extremities normal, atraumatic, no cyanosis or edema Pulses: 2+ and symmetric Skin: Skin color, texture, turgor normal. No rashes or lesions Lymph nodes: Cervical, supraclavicular, and axillary nodes normal. Neurologic: Alert and oriented X 3, normal strength and tone. Normal symmetric reflexes.  Normal coordination and gait    Assessment:    Healthy female exam.      Plan:  Marland KitchenMarland KitchenMariem was seen today for annual exam.  Diagnoses and all orders for this visit:  COPD exacerbation ([place]) -     Fluticasone-Umeclidin-Vilant (TRELEGY ELLIPTA) 100-62.5-25 MCG/INH AEPB; Take 1 puff by mouth daily.  Pure hypercholesterolemia -     atorvastatin (LIPITOR) 20 MG tablet; Take 1 tablet (20 mg total) by mouth daily.  Essential hypertension -     lisinopril (PRINIVIL,ZESTRIL) 20 MG tablet; Take 2 tablets (40 mg total) by mouth daily.  Age-related osteoporosis without current pathological fracture -     alendronate (FOSAMAX) 70 MG tablet; TAKE ONE TABLET BY MOUTH ONCE A WEEK, TAKE WITH A FULL GLASS OF WATER ON AN EMPTY STOMACH  Chronic pain syndrome -     oxyCODONE-acetaminophen (PERCOCET) 10-325 MG tablet; Take 1 tablet by mouth every 6 (six) hours as needed for pain. -     oxyCODONE-acetaminophen (PERCOCET) 10-325 MG tablet; Take 1 tablet by mouth every 6 (six) hours as needed for pain. -     oxyCODONE-acetaminophen (PERCOCET) 10-325 MG tablet; Take 0.5-1 tablets by mouth every 6 (six) hours as needed for pain.  DDD (degenerative disc disease), lumbar -     oxyCODONE-acetaminophen (PERCOCET) 10-325 MG tablet; Take 0.5-1 tablets by mouth every 6 (six) hours as needed for pain.  Other orders -     predniSONE (DELTASONE) 50 MG tablet; One tab PO daily for 5 days.   .. Depression screen [facility] 2/9 01/13/2018 09/30/2017 06/25/2017 11/26/2016 06/05/2016  Decreased Interest 0 0 1 0 1  Down, Depressed, Hopeless 0 0 0 0 0  PHQ - 2 Score 0 0 1 0 1  Altered sleeping 0 0 1 - -  Tired, decreased energy 1 0 1 - -  Change in appetite 1 0 1 - -  Feeling bad or failure about yourself  0 0 0 - -  Trouble concentrating 0 0 0 - -  Moving slowly or fidgety/restless 0 0 0 - -  Suicidal thoughts 0 0 0 - -  PHQ-9 Score 2 0 4 - -  Difficult doing work/chores Not difficult at all Not difficult at all Not  difficult at all - -   .Marland Kitchen Discussed 150 minutes of exercise a week.  Encouraged vitamin D 1000 units and Calcium 1300mg  or 4 servings of dairy a day.  Pt will get her CT chest lung screening in future per patient.  Vaccines up to date.  Needs shingrix.  Fasting labs ordered.   Sent prednisone for COPD exacerbation. Continue with symptomatic care.   BP looks good. meds refilled.   3 months of opioid refilled.  Contract up to date. 03/04/17. UDS up to date.  No problems or concerns.     See After  Visit Summary for Counseling Recommendations

## 2018-01-15 ENCOUNTER — Encounter: Payer: Self-pay | Admitting: Physician Assistant

## 2018-01-15 ENCOUNTER — Other Ambulatory Visit: Payer: Self-pay | Admitting: Physician Assistant

## 2018-01-15 DIAGNOSIS — I10 Essential (primary) hypertension: Secondary | ICD-10-CM

## 2018-01-15 NOTE — Telephone Encounter (Signed)
Call pt: cholesterol looks great. Kidney function up a tad. Could be OTC medications. Make sure staying hydrated. Will check in 3 months.

## 2018-01-15 NOTE — Progress Notes (Signed)
Per labs on 01/13/2018  "Notes recorded by Donella Stade, PA-C on 01/15/2018 at 7:47 AM EST Call pt: cholesterol looks great. Kidney function up a tad. Could be OTC medications. Make sure staying hydrated. Will check in 3 months."

## 2018-01-19 ENCOUNTER — Encounter: Payer: Self-pay | Admitting: Physician Assistant

## 2018-01-19 MED ORDER — PREDNISONE 20 MG PO TABS: ORAL_TABLET | ORAL | 0 refills | Status: DC

## 2018-01-26 ENCOUNTER — Encounter: Payer: Self-pay | Admitting: Physician Assistant

## 2018-01-27 ENCOUNTER — Ambulatory Visit (INDEPENDENT_AMBULATORY_CARE_PROVIDER_SITE_OTHER): Payer: BLUE CROSS/BLUE SHIELD

## 2018-01-27 ENCOUNTER — Encounter: Payer: Self-pay | Admitting: Physician Assistant

## 2018-01-27 DIAGNOSIS — R05 Cough: Secondary | ICD-10-CM

## 2018-01-27 DIAGNOSIS — J441 Chronic obstructive pulmonary disease with (acute) exacerbation: Secondary | ICD-10-CM

## 2018-01-27 DIAGNOSIS — R0602 Shortness of breath: Secondary | ICD-10-CM

## 2018-01-27 DIAGNOSIS — R059 Cough, unspecified: Secondary | ICD-10-CM

## 2018-01-27 DIAGNOSIS — F17210 Nicotine dependence, cigarettes, uncomplicated: Secondary | ICD-10-CM | POA: Diagnosis not present

## 2018-01-27 NOTE — Telephone Encounter (Signed)
Call pt: CXR shows hyperinflated lungs consistent with COPD but no acute changes. I think with you getting better each time you are getting viruses back to back. Stay on mucinex. Keep using rescue inhalers. Rest and hydrate. Are you able to sleep at night?

## 2018-01-29 NOTE — Telephone Encounter (Signed)
Take one of her pain pills right before bed it can help suppress cough.

## 2018-02-01 ENCOUNTER — Encounter: Payer: Self-pay | Admitting: Physician Assistant

## 2018-02-04 ENCOUNTER — Ambulatory Visit: Payer: BLUE CROSS/BLUE SHIELD | Admitting: Physician Assistant

## 2018-02-04 ENCOUNTER — Encounter: Payer: Self-pay | Admitting: Physician Assistant

## 2018-02-04 VITALS — BP 170/60 | HR 78 | Ht 67.0 in | Wt 129.0 lb

## 2018-02-04 DIAGNOSIS — J0140 Acute pansinusitis, unspecified: Secondary | ICD-10-CM

## 2018-02-04 DIAGNOSIS — J410 Simple chronic bronchitis: Secondary | ICD-10-CM

## 2018-02-04 MED ORDER — LEVOFLOXACIN 500 MG PO TABS: 500.0000 mg | ORAL_TABLET | Freq: Every day | ORAL | 0 refills | Status: DC

## 2018-02-04 MED ORDER — IPRATROPIUM-ALBUTEROL 0.5-2.5 (3) MG/3ML IN SOLN: 3.0000 mL | RESPIRATORY_TRACT | 0 refills | Status: DC | PRN

## 2018-02-04 NOTE — Progress Notes (Signed)
Subjective:    Patient ID: Sue Smith, female    DOB: 03/14/1955, 63 y.o.   MRN: [id number]  HPI: This is a 63 year old female with COPD who presents to the clinic today with a chief complaint of a productive cough of yellow/clear sputum for 3 weeks. She was seen in clinic on 01/13/18 with similar complaints and was treated with  Prednisone. She states that she began to feel better after this but has since returned to where she is at now. She also complains of sinus pressure, rhinorrhea, and fatigue. She denies fever, chills, and myalgias. She is currently using Flonase, Mucinex, albuterol inhalers and nebulizers each day to treat her symptoms.   .. Active Ambulatory Problems    Diagnosis Date Noted  . Essential hypertension 11/04/2011  . Surgical menopause 11/04/2011  . Vasomotor flushing 11/18/2011  . Pre-diabetes 11/20/2011  . Personal history of colonic polyps 03/26/2012  . Osteoporosis 11/25/2012  . Hyperlipidemia 11/25/2012  . DDD (degenerative disc disease), lumbar 03/23/2013  . Insomnia 11/25/2013  . COPD bronchitis 02/16/2015  . Simple chronic bronchitis ([place]) 08/27/2016  . Actinic keratosis 11/29/2016  . Chronic pain syndrome 11/29/2016  . Gastroesophageal reflux disease 11/29/2016  . Current smoker 02/26/2017  . Grief 06/30/2017   Resolved Ambulatory Problems    Diagnosis Date Noted  . No Resolved Ambulatory Problems   Past Medical History:  Diagnosis Date  . Cervical dysplasia          Review of Systems  Constitutional: Positive for fatigue. Negative for chills and fever.  HENT: Positive for congestion, postnasal drip, rhinorrhea, sinus pressure and sinus pain. Negative for ear discharge, ear pain and sore throat.   Eyes: Negative for discharge and itching.  Respiratory: Positive for cough and shortness of breath. Negative for apnea and chest tightness.   Cardiovascular: Negative for chest pain and palpitations.  Musculoskeletal: Negative for  arthralgias and myalgias.  Skin: Negative for rash.       Objective:   Physical Exam Vitals signs reviewed.  Constitutional:      General: She is not in acute distress.    Appearance: She is not ill-appearing.  HENT:     Head: Normocephalic and atraumatic.     Right Ear: Tympanic membrane, ear canal and external ear normal. There is impacted cerumen.     Left Ear: Tympanic membrane, ear canal and external ear normal. There is no impacted cerumen.     Nose: Nose normal. No congestion or rhinorrhea.     Mouth/Throat:     Mouth: Mucous membranes are moist.     Pharynx: Oropharynx is clear. No oropharyngeal exudate or posterior oropharyngeal erythema.  Eyes:     General: No scleral icterus.       Right eye: No discharge.        Left eye: No discharge.     Extraocular Movements: Extraocular movements intact.     Conjunctiva/sclera: Conjunctivae normal.     Pupils: Pupils are equal, round, and reactive to light.  Neck:     Musculoskeletal: Normal range of motion and neck supple. No neck rigidity or muscular tenderness.  Cardiovascular:     Rate and Rhythm: Normal rate.     Pulses: Normal pulses.     Heart sounds: Normal heart sounds.  Pulmonary:     Effort: Pulmonary effort is normal. No respiratory distress.     Breath sounds: Normal breath sounds. No wheezing, rhonchi or rales.  Abdominal:     General:  Abdomen is flat. There is no distension.  Lymphadenopathy:     Cervical: No cervical adenopathy.  Skin:    General: Skin is warm and dry.     Capillary Refill: Capillary refill takes less than 2 seconds.     Coloration: Skin is not jaundiced.     Findings: No bruising or rash.  Neurological:     Mental Status: She is alert.     Coordination: Coordination normal.     Gait: Gait normal.  Psychiatric:        Mood and Affect: Mood normal.        Behavior: Behavior normal.        Thought Content: Thought content normal.           Assessment & Plan:  Marland KitchenMarland KitchenGraceland was seen  today for bronchitis.  Diagnoses and all orders for this visit:  Acute non-recurrent pansinusitis -     levofloxacin (LEVAQUIN) 500 MG tablet; Take 1 tablet (500 mg total) by mouth daily.  Simple chronic bronchitis (HCC) -     ipratropium-albuterol (DUONEB) 0.5-2.5 (3) MG/3ML SOLN; Take 3 mLs by nebulization every 4 (four) hours as needed.   Pt has been sick for almost a month now. Her lungs actually sound really good today. Pt has a lot of intolerance to antibiotics. Will give patient her own solution to use as needed for SOB/Cough/Wheezing.  -Levaquin 500mg  PO QD x 7days -DuoNeb 56ml NEB up to QID PRN for ShOB  BP elevated today. Pt just used nebulizer before coming into office. Last BP was perfect. Will continue to monitor.   Marland KitchenVernetta Honey PA-C, have reviewed and agree with the above documentation in it's entirety.

## 2018-02-04 NOTE — Patient Instructions (Signed)

## 2018-02-11 ENCOUNTER — Encounter: Payer: Self-pay | Admitting: Physician Assistant

## 2018-02-25 ENCOUNTER — Encounter: Payer: Self-pay | Admitting: Physician Assistant

## 2018-02-25 MED ORDER — PREDNISONE 50 MG PO TABS: ORAL_TABLET | ORAL | 0 refills | Status: DC

## 2018-02-26 ENCOUNTER — Other Ambulatory Visit: Payer: Self-pay | Admitting: Physician Assistant

## 2018-02-26 MED ORDER — AMBULATORY NON FORMULARY MEDICATION: 0 refills | Status: DC

## 2018-02-26 NOTE — Progress Notes (Unsigned)
Please print order for nebulizer and get it to [facility]. Pt has COPD.

## 2018-03-05 ENCOUNTER — Encounter: Payer: Self-pay | Admitting: Physician Assistant

## 2018-04-01 ENCOUNTER — Encounter: Payer: Self-pay | Admitting: Physician Assistant

## 2018-04-02 NOTE — Telephone Encounter (Signed)
Pt called requesting update on when she can get her pain medications refilled.   I advised pt that per providers review, last RX was filled 03-19-18. I advised her she is not due for refill again until 04-19-18, but she HAS to have an office visit before medication can be refilled.   Pt states she wants to make her appt for 05-01-18, I reminded patient AGAIN that no medication can be send in until appt is made.. she states that is fine because she has "some extra pills left over from last month anyway".   Appt scheduled for 05/01/18 for medication refill and pain contract update

## 2018-04-02 NOTE — Telephone Encounter (Signed)
Pt calls in requesting pain medication refill. Pt is on pain contract.  Last fill was on 03/19/2018. She does not need until 04/19/2018.   Marland KitchenMarland KitchenPDMP reviewed during this encounter.

## 2018-04-30 ENCOUNTER — Encounter: Payer: Self-pay | Admitting: Neurology

## 2018-05-01 ENCOUNTER — Ambulatory Visit: Payer: BLUE CROSS/BLUE SHIELD | Admitting: Physician Assistant

## 2018-05-01 ENCOUNTER — Encounter: Payer: Self-pay | Admitting: Physician Assistant

## 2018-05-01 VITALS — BP 133/58 | HR 82 | Temp 97.8°F | Ht 67.0 in | Wt 127.0 lb

## 2018-05-01 DIAGNOSIS — G894 Chronic pain syndrome: Secondary | ICD-10-CM

## 2018-05-01 DIAGNOSIS — M810 Age-related osteoporosis without current pathological fracture: Secondary | ICD-10-CM | POA: Diagnosis not present

## 2018-05-01 DIAGNOSIS — F17200 Nicotine dependence, unspecified, uncomplicated: Secondary | ICD-10-CM

## 2018-05-01 DIAGNOSIS — M5136 Other intervertebral disc degeneration, lumbar region: Secondary | ICD-10-CM

## 2018-05-01 DIAGNOSIS — J441 Chronic obstructive pulmonary disease with (acute) exacerbation: Secondary | ICD-10-CM | POA: Diagnosis not present

## 2018-05-01 MED ORDER — PREDNISONE 50 MG PO TABS: ORAL_TABLET | ORAL | 0 refills | Status: DC

## 2018-05-01 MED ORDER — OXYCODONE-ACETAMINOPHEN 10-325 MG PO TABS: 1.0000 | ORAL_TABLET | Freq: Four times a day (QID) | ORAL | 0 refills | Status: DC | PRN

## 2018-05-01 MED ORDER — OXYCODONE-ACETAMINOPHEN 10-325 MG PO TABS: 0.5000 | ORAL_TABLET | Freq: Four times a day (QID) | ORAL | 0 refills | Status: DC | PRN

## 2018-05-01 NOTE — Progress Notes (Signed)
Subjective:    Patient ID: Sue Smith, female    DOB: Jul 31, 1955, 63 y.o.   MRN: [id number]  HPI  Pt is a 63 yo female with COPD, Osteoporosis and chronic pain who presents to the clinic for 3 month follow and medication refill.   She is doing well with pain control. No problems or concerns. She is due for her pain contract renewed.   She does admit to leaning over her lawn mower this weekend and feeling like she injured a rib. She does have osteoporosis and on fosamax.   She is on Trelegy and nebulizers as needed. She does have more SOb, cough, mucus production over the past week. She continues to smoke but very little like 2-3 a day. She is taking mucinex. No fever, chills. No sick contacts. She lives alone. She has been abiding by quarantine during [place] pandemic.   .. Active Ambulatory Problems    Diagnosis Date Noted  . Essential hypertension 11/04/2011  . Surgical menopause 11/04/2011  . Vasomotor flushing 11/18/2011  . Pre-diabetes 11/20/2011  . Personal history of colonic polyps 03/26/2012  . Osteoporosis 11/25/2012  . Hyperlipidemia 11/25/2012  . DDD (degenerative disc disease), lumbar 03/23/2013  . Insomnia 11/25/2013  . COPD bronchitis 02/16/2015  . Simple chronic bronchitis ([place]) 08/27/2016  . Actinic keratosis 11/29/2016  . Chronic pain syndrome 11/29/2016  . Gastroesophageal reflux disease 11/29/2016  . Current smoker 02/26/2017  . Grief 06/30/2017   Resolved Ambulatory Problems    Diagnosis Date Noted  . No Resolved Ambulatory Problems   Past Medical History:  Diagnosis Date  . Cervical dysplasia      Review of Systems See HPI>     Objective:   Physical Exam Vitals signs reviewed.  Constitutional:      Appearance: Normal appearance.  HENT:     Head: Normocephalic.  Cardiovascular:     Rate and Rhythm: Normal rate and regular rhythm.  Pulmonary:     Effort: Pulmonary effort is normal.     Comments: Coarse breath sounds.  Neurological:     General: No focal deficit present.     Mental Status: She is alert.  Psychiatric:        Mood and Affect: Mood normal.           Assessment & Plan:  Marland KitchenMarland KitchenSpruha was seen today for pain.  Diagnoses and all orders for this visit:  Chronic pain syndrome -     oxyCODONE-acetaminophen (PERCOCET) 10-325 MG tablet; Take 1 tablet by mouth every 6 (six) hours as needed for pain. -     oxyCODONE-acetaminophen (PERCOCET) 10-325 MG tablet; Take 1 tablet by mouth every 6 (six) hours as needed for pain. -     oxyCODONE-acetaminophen (PERCOCET) 10-325 MG tablet; Take 0.5-1 tablets by mouth every 6 (six) hours as needed for pain. -     Pain Mgmt, Profile 6 Conf w/o mM, U  DDD (degenerative disc disease), lumbar -     oxyCODONE-acetaminophen (PERCOCET) 10-325 MG tablet; Take 1 tablet by mouth every 6 (six) hours as needed for pain. -     oxyCODONE-acetaminophen (PERCOCET) 10-325 MG tablet; Take 1 tablet by mouth every 6 (six) hours as needed for pain. -     oxyCODONE-acetaminophen (PERCOCET) 10-325 MG tablet; Take 0.5-1 tablets by mouth every 6 (six) hours as needed for pain. -     Pain Mgmt, Profile 6 Conf w/o mM, U  Chronic obstructive pulmonary disease with acute exacerbation (HCC) -  predniSONE (DELTASONE) 50 MG tablet; One tab PO daily for 5 days.  Age-related osteoporosis without current pathological fracture  Current smoker   Pain contract resigned.  UDS done today.  rx refilled for 3 months.  ..PDMP reviewed during this encounter.  Discussed symptomatic care for ribs with ice, biofreeze, good deep breathing exercises. On fosamax/vitamin D/[place].   Pt having a COPD exacerbation. STRONGLY encouraged to stop smoking for good! Pt is trying. Prednisone burst given. Continue symptomatic care. No infection seen. Discussion prednisone making bones worse. STOP smoking. Follow up if symptoms worsening. Vitals reassuring. High risk COVID. STAY at home.   Follow up in 3 months.

## 2018-05-03 LAB — PAIN MGMT, PROFILE 6 CONF W/O MM, U
6 Acetylmorphine: NEGATIVE ng/mL (ref <10)
Alcohol Metabolites: NEGATIVE ng/mL (ref <500)
Amphetamines: NEGATIVE ng/mL (ref <500)
Barbiturates: NEGATIVE ng/mL (ref <300)
Benzodiazepines: NEGATIVE ng/mL (ref <100)
Cocaine Metabolite: NEGATIVE ng/mL (ref <150)
Codeine: NEGATIVE ng/mL (ref <50)
Creatinine: 60.8 mg/dL
Hydrocodone: NEGATIVE ng/mL (ref <50)
Hydromorphone: NEGATIVE ng/mL (ref <50)
Marijuana Metabolite: NEGATIVE ng/mL (ref <20)
Methadone Metabolite: NEGATIVE ng/mL (ref <100)
Morphine: NEGATIVE ng/mL (ref <50)
Norhydrocodone: NEGATIVE ng/mL (ref <50)
Noroxycodone: 5093 ng/mL — ABNORMAL HIGH (ref <50)
Opiates: NEGATIVE ng/mL (ref <100)
Oxidant: NEGATIVE ug/mL (ref <200)
Oxycodone: 954 ng/mL — ABNORMAL HIGH (ref <50)
Oxycodone: POSITIVE ng/mL — AB (ref <100)
Oxymorphone: 1026 ng/mL — ABNORMAL HIGH (ref <50)
Phencyclidine: NEGATIVE ng/mL (ref <25)
pH: 6.78 (ref 4.5–9.0)

## 2018-05-04 NOTE — Progress Notes (Signed)
UDS as suspected. No concerning features.

## 2018-05-06 ENCOUNTER — Encounter: Payer: Self-pay | Admitting: Physician Assistant

## 2018-05-06 MED ORDER — PREDNISONE 20 MG PO TABS: ORAL_TABLET | ORAL | 0 refills | Status: DC

## 2018-05-18 ENCOUNTER — Encounter: Payer: Self-pay | Admitting: Physician Assistant

## 2018-05-18 DIAGNOSIS — J449 Chronic obstructive pulmonary disease, unspecified: Secondary | ICD-10-CM

## 2018-05-18 DIAGNOSIS — F17200 Nicotine dependence, unspecified, uncomplicated: Secondary | ICD-10-CM

## 2018-05-18 NOTE — Addendum Note (Signed)
Addended by: Donella Stade on: 05/18/2018 01:59 PM   Modules accepted: Orders

## 2018-06-02 DIAGNOSIS — J449 Chronic obstructive pulmonary disease, unspecified: Secondary | ICD-10-CM | POA: Diagnosis not present

## 2018-06-02 DIAGNOSIS — F17210 Nicotine dependence, cigarettes, uncomplicated: Secondary | ICD-10-CM | POA: Diagnosis not present

## 2018-06-02 LAB — PULMONARY FUNCTION TEST

## 2018-06-03 ENCOUNTER — Encounter: Payer: Self-pay | Admitting: Physician Assistant

## 2018-06-04 DIAGNOSIS — F17210 Nicotine dependence, cigarettes, uncomplicated: Secondary | ICD-10-CM | POA: Diagnosis not present

## 2018-06-16 DIAGNOSIS — F17210 Nicotine dependence, cigarettes, uncomplicated: Secondary | ICD-10-CM | POA: Diagnosis not present

## 2018-06-16 DIAGNOSIS — J449 Chronic obstructive pulmonary disease, unspecified: Secondary | ICD-10-CM | POA: Diagnosis not present

## 2018-07-29 ENCOUNTER — Ambulatory Visit: Payer: BC Managed Care – PPO | Admitting: Physician Assistant

## 2018-07-29 ENCOUNTER — Encounter: Payer: Self-pay | Admitting: Physician Assistant

## 2018-07-29 ENCOUNTER — Other Ambulatory Visit: Payer: Self-pay

## 2018-07-29 VITALS — BP 144/86 | HR 60 | Temp 98.4°F | Ht 67.0 in | Wt 130.0 lb

## 2018-07-29 DIAGNOSIS — G894 Chronic pain syndrome: Secondary | ICD-10-CM

## 2018-07-29 DIAGNOSIS — M5136 Other intervertebral disc degeneration, lumbar region: Secondary | ICD-10-CM | POA: Diagnosis not present

## 2018-07-29 DIAGNOSIS — F17200 Nicotine dependence, unspecified, uncomplicated: Secondary | ICD-10-CM

## 2018-07-29 DIAGNOSIS — J449 Chronic obstructive pulmonary disease, unspecified: Secondary | ICD-10-CM

## 2018-07-29 DIAGNOSIS — Z23 Encounter for immunization: Secondary | ICD-10-CM

## 2018-07-29 DIAGNOSIS — I10 Essential (primary) hypertension: Secondary | ICD-10-CM

## 2018-07-29 DIAGNOSIS — R5383 Other fatigue: Secondary | ICD-10-CM | POA: Insufficient documentation

## 2018-07-29 MED ORDER — OXYCODONE-ACETAMINOPHEN 10-325 MG PO TABS: 1.0000 | ORAL_TABLET | Freq: Four times a day (QID) | ORAL | 0 refills | Status: DC | PRN

## 2018-07-29 MED ORDER — OXYCODONE-ACETAMINOPHEN 10-325 MG PO TABS: 0.5000 | ORAL_TABLET | Freq: Four times a day (QID) | ORAL | 0 refills | Status: DC | PRN

## 2018-07-29 NOTE — Progress Notes (Signed)
Subjective:    Patient ID: Sue Smith, female    DOB: October 13, 1955, 63 y.o.   MRN: [id number]  HPI Pt is a 63 yo female with COPD, HTN, chronic pain who presents to the clinic for medication refill. She is doing fairly well. No major concerns. She does mention being more tired lately. She sleeps well at night and wakes up feeling rested. She tends to get really sleep around 3pm. She takes a nap but does not help. She does have some "depressed days" since her husband died. She takes vitamin D, b12, iron. She admits she does not like to cook and she does not eat a lot during the day.   .. Active Ambulatory Problems    Diagnosis Date Noted  . Essential hypertension 11/04/2011  . Surgical menopause 11/04/2011  . Vasomotor flushing 11/18/2011  . Pre-diabetes 11/20/2011  . Personal history of colonic polyps 03/26/2012  . Osteoporosis 11/25/2012  . Hyperlipidemia 11/25/2012  . DDD (degenerative disc disease), lumbar 03/23/2013  . Insomnia 11/25/2013  . COPD (chronic obstructive pulmonary disease) with chronic bronchitis ([place]) 02/16/2015  . Simple chronic bronchitis ([place]) 08/27/2016  . Actinic keratosis 11/29/2016  . Chronic pain syndrome 11/29/2016  . Gastroesophageal reflux disease 11/29/2016  . Current smoker 02/26/2017  . Grief 06/30/2017  . No energy 07/29/2018   Resolved Ambulatory Problems    Diagnosis Date Noted  . No Resolved Ambulatory Problems   Past Medical History:  Diagnosis Date  . Cervical dysplasia       Review of Systems  All other systems reviewed and are negative.      Objective:   Physical Exam Vitals signs reviewed.  Constitutional:      Appearance: Normal appearance.  Cardiovascular:     Rate and Rhythm: Normal rate and regular rhythm.  Pulmonary:     Effort: Pulmonary effort is normal.     Breath sounds: Normal breath sounds.  Neurological:     General: No focal deficit present.     Mental Status: She is alert and oriented to person,  place, and time.  Psychiatric:        Mood and Affect: Mood normal.        Behavior: Behavior normal.           Assessment & Plan:  Marland KitchenMarland KitchenAeliana was seen today for pain.  Diagnoses and all orders for this visit:  Chronic pain syndrome -     oxyCODONE-acetaminophen (PERCOCET) 10-325 MG tablet; Take 1 tablet by mouth every 6 (six) hours as needed for pain. -     oxyCODONE-acetaminophen (PERCOCET) 10-325 MG tablet; Take 1 tablet by mouth every 6 (six) hours as needed for pain. -     oxyCODONE-acetaminophen (PERCOCET) 10-325 MG tablet; Take 0.5-1 tablets by mouth every 6 (six) hours as needed for pain.  DDD (degenerative disc disease), lumbar -     oxyCODONE-acetaminophen (PERCOCET) 10-325 MG tablet; Take 1 tablet by mouth every 6 (six) hours as needed for pain. -     oxyCODONE-acetaminophen (PERCOCET) 10-325 MG tablet; Take 1 tablet by mouth every 6 (six) hours as needed for pain. -     oxyCODONE-acetaminophen (PERCOCET) 10-325 MG tablet; Take 0.5-1 tablets by mouth every 6 (six) hours as needed for pain.  Need for Tdap vaccination -     Tdap vaccine greater than or equal to 7yo IM  COPD (chronic obstructive pulmonary disease) with chronic bronchitis (HCC)  Essential hypertension  Current smoker  No energy   .Marland KitchenPDMP reviewed  during this encounter. Pain contract up to date.  Refilled for 3 months.   Tdap given today.   Discussed fatigue. Could be depressed mood. Pt declined intervention. Offered labs. She declined. Could certainly be hot weather and COPD with lungs working harder and the fact she is on oxycodone regularly. Could also be that she is not eating enough during the day. Encouraged better nutrition and consider boost drinks.   COPD-managed by pulmonology   Follow up as needed or in 3 months.

## 2018-07-31 DIAGNOSIS — H5203 Hypermetropia, bilateral: Secondary | ICD-10-CM | POA: Diagnosis not present

## 2018-07-31 DIAGNOSIS — Z135 Encounter for screening for eye and ear disorders: Secondary | ICD-10-CM | POA: Diagnosis not present

## 2018-07-31 DIAGNOSIS — H2513 Age-related nuclear cataract, bilateral: Secondary | ICD-10-CM | POA: Diagnosis not present

## 2018-08-26 ENCOUNTER — Encounter: Payer: Self-pay | Admitting: Physician Assistant

## 2018-09-04 ENCOUNTER — Encounter: Payer: Self-pay | Admitting: Physician Assistant

## 2018-09-10 ENCOUNTER — Other Ambulatory Visit: Payer: Self-pay

## 2018-09-10 ENCOUNTER — Ambulatory Visit: Payer: BC Managed Care – PPO | Admitting: Family Medicine

## 2018-09-10 ENCOUNTER — Encounter: Payer: Self-pay | Admitting: Family Medicine

## 2018-09-10 VITALS — BP 179/82 | HR 67 | Temp 98.0°F | Ht 67.0 in | Wt 131.0 lb

## 2018-09-10 DIAGNOSIS — N3001 Acute cystitis with hematuria: Secondary | ICD-10-CM

## 2018-09-10 DIAGNOSIS — I10 Essential (primary) hypertension: Secondary | ICD-10-CM

## 2018-09-10 DIAGNOSIS — R300 Dysuria: Secondary | ICD-10-CM | POA: Diagnosis not present

## 2018-09-10 LAB — POCT URINALYSIS DIPSTICK
Bilirubin, UA: NEGATIVE
Glucose, UA: NEGATIVE
Ketones, UA: NEGATIVE
Nitrite, UA: NEGATIVE
Protein, UA: NEGATIVE
Spec Grav, UA: 1.02 (ref 1.010–1.025)
Urobilinogen, UA: 0.2 E.U./dL
pH, UA: 7.5 (ref 5.0–8.0)

## 2018-09-10 MED ORDER — CIPROFLOXACIN HCL 500 MG PO TABS: 500.0000 mg | ORAL_TABLET | Freq: Two times a day (BID) | ORAL | 0 refills | Status: DC

## 2018-09-10 NOTE — Progress Notes (Signed)
Sue Smith is a 63 y.o. female who presents to [facility] today for urinary tract infection.  Patient notes a one-week history of urinary frequency urgency and dysuria.  Symptoms are consistent with previous episodes of urinary tract infection.  Patient has allergies to several different medications including amoxicillin sulfa and cephalosporin antibiotics.  She notes that she cantolerate Cipro quite well.  She also notes her blood pressures typically much better controlled.  When she checks it at home is usually in the 130s or less.  No chest pain or palpitations today.    ROS as above:  Exam:  BP (!) 179/82   Pulse 67   Temp 98 F (36.7 C) (Oral)   Ht 5\' 7"  (1.702 m)   Wt 131 lb (59.4 kg)   BMI 20.52 kg/m  Wt Readings from Last 5 Encounters:  09/10/18 131 lb (59.4 kg)  07/29/18 130 lb (59 kg)  05/01/18 127 lb (57.6 kg)  02/04/18 129 lb (58.5 kg)  01/13/18 130 lb (59 kg)    Gen: Well NAD HEENT: EOMI,  MMM Lungs: Normal work of breathing. CTABL Heart: RRR no MRG Abd: NABS, Soft. Nondistended, Nontender no masses palpated.  No CVA angle tenderness to percussion Exts: Brisk capillary refill, warm and well perfused.   Lab and Radiology Results Results for orders placed or performed in visit on 09/10/18 (from the past 72 hour(s))  POCT urinalysis dipstick     Status: Abnormal   Collection Time: 09/10/18  3:57 PM  Result Value Ref Range   Color, UA Yellow    Clarity, UA Cloudy    Glucose, UA Negative Negative   Bilirubin, UA Negative    Ketones, UA Negative    Spec Grav, UA 1.020 1.010 - 1.025   Blood, UA Moderate    pH, UA 7.5 5.0 - 8.0   Protein, UA Negative Negative   Urobilinogen, UA 0.2 0.2 or 1.0 E.U./dL   Nitrite, UA Negative    Leukocytes, UA Large (3+) (A) Negative   Appearance Cloudy    Odor malodorous    No results found.     Assessment and Plan: 63 y.o. female with urinary tract infection.  Plan to treat empirically with Cipro.  Urine culture pending.  Blood pressure elevated today.  Plan for home blood pressure monitoring and check back with PCP if remains elevated.  Precautions reviewed.  PDMP not reviewed this encounter. Orders Placed This Encounter  Procedures  . Urine Culture  . Urinalysis, microscopic only  . POCT urinalysis dipstick   Meds ordered this encounter  Medications  . ciprofloxacin (CIPRO) 500 MG tablet    Sig: Take 1 tablet (500 mg total) by mouth 2 (two) times daily.    Dispense:  14 tablet    Refill:  0     Historical information moved to improve visibility of documentation.  Past Medical History:  Diagnosis Date  . Cervical dysplasia    Past Surgical History:  Procedure Laterality Date  . ABDOMINAL HYSTERECTOMY    . APPENDECTOMY    . TOTAL ABDOMINAL HYSTERECTOMY W/ BILATERAL SALPINGOOPHORECTOMY     Social History   Tobacco Use  . Smoking status: Current Every Day Smoker    Packs/day: 0.50    Types: Cigarettes  . Smokeless tobacco: Never Used  Substance Use Topics  . Alcohol use: Yes    Alcohol/week: 2.0 standard drinks    Types: 2 Standard drinks or equivalent  per week   family history includes Cancer in her mother; Heart failure in her father.  Medications: Current Outpatient Medications  Medication Sig Dispense Refill  . albuterol (PROVENTIL HFA;VENTOLIN HFA) 108 (90 Base) MCG/ACT inhaler Inhale 2 puffs into the lungs every 6 (six) hours as needed for wheezing. 1 Inhaler 2  . alendronate (FOSAMAX) 70 MG tablet TAKE ONE TABLET BY MOUTH ONCE A WEEK, TAKE WITH A FULL GLASS OF WATER ON AN EMPTY STOMACH 12 tablet 3  . AMBULATORY NON FORMULARY MEDICATION One nebulizer machine for COPD management. 1 Device 0  . atorvastatin (LIPITOR) 20 MG tablet Take 1 tablet (20 mg total) by mouth daily. 90 tablet 3  . Fluticasone-Umeclidin-Vilant (TRELEGY ELLIPTA) 100-62.5-25  MCG/INH AEPB Take 1 puff by mouth daily. 60 each 11  . ipratropium-albuterol (DUONEB) 0.5-2.5 (3) MG/3ML SOLN Take 3 mLs by nebulization every 4 (four) hours as needed. 360 mL 0  . lisinopril (PRINIVIL,ZESTRIL) 20 MG tablet Take 2 tablets (40 mg total) by mouth daily. 180 tablet 3  . oxyCODONE-acetaminophen (PERCOCET) 10-325 MG tablet Take 1 tablet by mouth every 6 (six) hours as needed for pain. 120 tablet 0  . oxyCODONE-acetaminophen (PERCOCET) 10-325 MG tablet Take 1 tablet by mouth every 6 (six) hours as needed for pain. 120 tablet 0  . [START ON 09/29/2018] oxyCODONE-acetaminophen (PERCOCET) 10-325 MG tablet Take 0.5-1 tablets by mouth every 6 (six) hours as needed for pain. 120 tablet 0  . ciprofloxacin (CIPRO) 500 MG tablet Take 1 tablet (500 mg total) by mouth 2 (two) times daily. 14 tablet 0   No current facility-administered medications for this visit.    Allergies  Allergen Reactions  . Ampicillin Hives  . Erythromycin Anaphylaxis  . Keflex [Cephalexin] Hives  . Penicillins Hives  . Sulfa Antibiotics Hives  . Doxycycline Swelling  . Tetracyclines & Related     Esophageal swelling  . Chantix [Varenicline]     Nausea/very sick  . Hctz [Hydrochlorothiazide] Rash     Discussed warning signs or symptoms. Please see discharge instructions. Patient expresses understanding.

## 2018-09-10 NOTE — Progress Notes (Signed)
poct

## 2018-09-10 NOTE — Patient Instructions (Signed)
Thank you for coming in today. Take cipro twice daily for 1 week.  Let me know if not better.  I will get the urine culture results to you ASAP.    Urinary Tract Infection, Adult A urinary tract infection (UTI) is an infection of any part of the urinary tract. The urinary tract includes:  The kidneys.  The ureters.  The bladder.  The urethra. These organs make, store, and get rid of pee (urine) in the body. What are the causes? This is caused by germs (bacteria) in your genital area. These germs grow and cause swelling (inflammation) of your urinary tract. What increases the risk? You are more likely to develop this condition if:  You have a small, thin tube (catheter) to drain pee.  You cannot control when you pee or poop (incontinence).  You are female, and: ? You use these methods to prevent pregnancy: ? A medicine that kills sperm (spermicide). ? A device that blocks sperm (diaphragm). ? You have low levels of a female hormone (estrogen). ? You are pregnant.  You have genes that add to your risk.  You are sexually active.  You take antibiotic medicines.  You have trouble peeing because of: ? A prostate that is bigger than normal, if you are female. ? A blockage in the part of your body that drains pee from the bladder (urethra). ? A kidney stone. ? A nerve condition that affects your bladder (neurogenic bladder). ? Not getting enough to drink. ? Not peeing often enough.  You have other conditions, such as: ? Diabetes. ? A weak disease-fighting system (immune system). ? Sickle cell disease. ? Gout. ? Injury of the spine. What are the signs or symptoms? Symptoms of this condition include:  Needing to pee right away (urgently).  Peeing often.  Peeing small amounts often.  Pain or burning when peeing.  Blood in the pee.  Pee that smells bad or not like normal.  Trouble peeing.  Pee that is cloudy.  Fluid coming from the vagina, if you are female.   Pain in the belly or lower back. Other symptoms include:  Throwing up (vomiting).  No urge to eat.  Feeling mixed up (confused).  Being tired and grouchy (irritable).  A fever.  Watery poop (diarrhea). How is this treated? This condition may be treated with:  Antibiotic medicine.  Other medicines.  Drinking enough water. Follow these instructions at home:  Medicines  Take over-the-counter and prescription medicines only as told by your doctor.  If you were prescribed an antibiotic medicine, take it as told by your doctor. Do not stop taking it even if you start to feel better. General instructions  Make sure you: ? Pee until your bladder is empty. ? Do not hold pee for a long time. ? Empty your bladder after sex. ? Wipe from front to back after pooping if you are a female. Use each tissue one time when you wipe.  Drink enough fluid to keep your pee pale yellow.  Keep all follow-up visits as told by your doctor. This is important. Contact a doctor if:  You do not get better after 1-2 days.  Your symptoms go away and then come back. Get help right away if:  You have very bad back pain.  You have very bad pain in your lower belly.  You have a fever.  You are sick to your stomach (nauseous).  You are throwing up. Summary  A urinary tract infection (UTI) is an  infection of any part of the urinary tract.  This condition is caused by germs in your genital area.  There are many risk factors for a UTI. These include having a small, thin tube to drain pee and not being able to control when you pee or poop.  Treatment includes antibiotic medicines for germs.  Drink enough fluid to keep your pee pale yellow. This information is not intended to replace advice given to you by your health care provider. Make sure you discuss any questions you have with your health care provider. Document Released: 06/19/2007 Document Revised: 12/18/2017 Document Reviewed:  07/10/2017 Elsevier Patient Education  2020 [employer].

## 2018-09-12 LAB — URINE CULTURE
MICRO NUMBER:: 820196
Result:: NO GROWTH
SPECIMEN QUALITY:: ADEQUATE

## 2018-09-12 LAB — URINALYSIS, MICROSCOPIC ONLY

## 2018-09-16 DIAGNOSIS — R0602 Shortness of breath: Secondary | ICD-10-CM | POA: Diagnosis not present

## 2018-09-16 DIAGNOSIS — J449 Chronic obstructive pulmonary disease, unspecified: Secondary | ICD-10-CM | POA: Diagnosis not present

## 2018-09-16 DIAGNOSIS — R05 Cough: Secondary | ICD-10-CM | POA: Diagnosis not present

## 2018-09-16 DIAGNOSIS — F17210 Nicotine dependence, cigarettes, uncomplicated: Secondary | ICD-10-CM | POA: Diagnosis not present

## 2018-11-04 ENCOUNTER — Ambulatory Visit (INDEPENDENT_AMBULATORY_CARE_PROVIDER_SITE_OTHER): Payer: Self-pay | Admitting: Physician Assistant

## 2018-11-04 ENCOUNTER — Encounter: Payer: Self-pay | Admitting: Physician Assistant

## 2018-11-04 ENCOUNTER — Other Ambulatory Visit: Payer: Self-pay

## 2018-11-04 VITALS — BP 142/88 | HR 54 | Ht 67.0 in | Wt 130.0 lb

## 2018-11-04 DIAGNOSIS — R062 Wheezing: Secondary | ICD-10-CM

## 2018-11-04 DIAGNOSIS — J441 Chronic obstructive pulmonary disease with (acute) exacerbation: Secondary | ICD-10-CM

## 2018-11-04 DIAGNOSIS — G894 Chronic pain syndrome: Secondary | ICD-10-CM

## 2018-11-04 DIAGNOSIS — M5136 Other intervertebral disc degeneration, lumbar region: Secondary | ICD-10-CM

## 2018-11-04 MED ORDER — OXYCODONE-ACETAMINOPHEN 10-325 MG PO TABS: 0.5000 | ORAL_TABLET | Freq: Four times a day (QID) | ORAL | 0 refills | Status: DC | PRN

## 2018-11-04 MED ORDER — MONTELUKAST SODIUM 10 MG PO TABS: 10.0000 mg | ORAL_TABLET | Freq: Every day | ORAL | 3 refills | Status: DC

## 2018-11-04 MED ORDER — OXYCODONE-ACETAMINOPHEN 10-325 MG PO TABS: 1.0000 | ORAL_TABLET | Freq: Four times a day (QID) | ORAL | 0 refills | Status: DC | PRN

## 2018-11-04 MED ORDER — PREDNISONE 50 MG PO TABS: ORAL_TABLET | ORAL | 0 refills | Status: DC

## 2018-11-04 NOTE — Progress Notes (Signed)
Subjective:    Patient ID: Sue Smith, female    DOB: 10-28-1955, 63 y.o.   MRN: [id number]  HPI Pt is a 63 yo female with chronic pain, COPD, HTN, GERD who presents to the clinic for her 3 month follow up.   She is doing well with pain. No problems or concerns.   She is seeing pulmonology for COPD. She is on trelegy and albuterol as needed. She has noticed some increase in productive cough, SOB. She feels like she has mucus stuck in her chest but cannot get it out. She has noticed since the fall feeling more and more SOB and congested.   Pt denies any CP, palpitations, headaches, or vision changes.   .. Active Ambulatory Problems    Diagnosis Date Noted  . Essential hypertension 11/04/2011  . Surgical menopause 11/04/2011  . Vasomotor flushing 11/18/2011  . Pre-diabetes 11/20/2011  . Personal history of colonic polyps 03/26/2012  . Osteoporosis 11/25/2012  . Hyperlipidemia 11/25/2012  . DDD (degenerative disc disease), lumbar 03/23/2013  . Insomnia 11/25/2013  . COPD (chronic obstructive pulmonary disease) with chronic bronchitis ([place]) 02/16/2015  . Simple chronic bronchitis ([place]) 08/27/2016  . Actinic keratosis 11/29/2016  . Chronic pain syndrome 11/29/2016  . Gastroesophageal reflux disease 11/29/2016  . Current smoker 02/26/2017  . Grief 06/30/2017  . No energy 07/29/2018   Resolved Ambulatory Problems    Diagnosis Date Noted  . No Resolved Ambulatory Problems   Past Medical History:  Diagnosis Date  . Cervical dysplasia       Review of Systems  All other systems reviewed and are negative.      Objective:   Physical Exam Vitals signs reviewed.  Constitutional:      Appearance: Normal appearance.  HENT:     Head: Normocephalic.     Mouth/Throat:     Mouth: Mucous membranes are moist.  Cardiovascular:     Rate and Rhythm: Normal rate and regular rhythm.     Pulses: Normal pulses.  Pulmonary:     Effort: Pulmonary effort is normal.     Breath  sounds: Wheezing present. No rhonchi.  Neurological:     General: No focal deficit present.     Mental Status: She is alert.  Psychiatric:        Mood and Affect: Mood normal.           Assessment & Plan:  Marland KitchenMarland KitchenMegon was seen today for pain.  Diagnoses and all orders for this visit:  Chronic pain syndrome -     oxyCODONE-acetaminophen (PERCOCET) 10-325 MG tablet; Take 1 tablet by mouth every 6 (six) hours as needed for pain. -     oxyCODONE-acetaminophen (PERCOCET) 10-325 MG tablet; Take 1 tablet by mouth every 6 (six) hours as needed for pain. -     oxyCODONE-acetaminophen (PERCOCET) 10-325 MG tablet; Take 0.5-1 tablets by mouth every 6 (six) hours as needed for pain.  DDD (degenerative disc disease), lumbar -     oxyCODONE-acetaminophen (PERCOCET) 10-325 MG tablet; Take 1 tablet by mouth every 6 (six) hours as needed for pain. -     oxyCODONE-acetaminophen (PERCOCET) 10-325 MG tablet; Take 1 tablet by mouth every 6 (six) hours as needed for pain. -     oxyCODONE-acetaminophen (PERCOCET) 10-325 MG tablet; Take 0.5-1 tablets by mouth every 6 (six) hours as needed for pain.  COPD exacerbation (HCC) -     montelukast (SINGULAIR) 10 MG tablet; Take 1 tablet (10 mg total) by mouth at bedtime. -  predniSONE (DELTASONE) 50 MG tablet; Take one tablet for 5 days.  Wheezing -     montelukast (SINGULAIR) 10 MG tablet; Take 1 tablet (10 mg total) by mouth at bedtime.   Pain contract UTD. Drug screen UTD.  Marland Kitchen.PDMP reviewed during this encounter. Refilled for 3 months.   BP better on 2nd recheck.   Pt seems like she is in a minor COPD exacerbation. I would like for her to try singulair/zyrtec and mucinex first. If worsening or not improving start prednisone burst. Discussed signs of infection and to call if any worsening symptoms. I would like for her to stay on singulair and zyrtec even after the exacerbation.

## 2018-11-09 ENCOUNTER — Encounter: Payer: Self-pay | Admitting: Physician Assistant

## 2018-11-09 DIAGNOSIS — J441 Chronic obstructive pulmonary disease with (acute) exacerbation: Secondary | ICD-10-CM

## 2018-11-09 MED ORDER — LEVOFLOXACIN 500 MG PO TABS: 500.0000 mg | ORAL_TABLET | Freq: Every day | ORAL | 0 refills | Status: DC

## 2018-11-09 MED ORDER — PREDNISONE 20 MG PO TABS: ORAL_TABLET | ORAL | 0 refills | Status: DC

## 2018-11-19 ENCOUNTER — Encounter: Payer: Self-pay | Admitting: Physician Assistant

## 2018-12-01 ENCOUNTER — Ambulatory Visit (INDEPENDENT_AMBULATORY_CARE_PROVIDER_SITE_OTHER): Payer: BC Managed Care – PPO | Admitting: Physician Assistant

## 2018-12-01 ENCOUNTER — Ambulatory Visit (INDEPENDENT_AMBULATORY_CARE_PROVIDER_SITE_OTHER): Payer: BC Managed Care – PPO

## 2018-12-01 ENCOUNTER — Other Ambulatory Visit: Payer: Self-pay | Admitting: Physician Assistant

## 2018-12-01 ENCOUNTER — Other Ambulatory Visit: Payer: Self-pay

## 2018-12-01 VITALS — BP 162/72 | HR 58 | Temp 98.5°F | Ht 67.0 in | Wt 132.0 lb

## 2018-12-01 DIAGNOSIS — R350 Frequency of micturition: Secondary | ICD-10-CM | POA: Diagnosis not present

## 2018-12-01 DIAGNOSIS — M5136 Other intervertebral disc degeneration, lumbar region: Secondary | ICD-10-CM

## 2018-12-01 DIAGNOSIS — Z1231 Encounter for screening mammogram for malignant neoplasm of breast: Secondary | ICD-10-CM

## 2018-12-01 DIAGNOSIS — M545 Low back pain, unspecified: Secondary | ICD-10-CM

## 2018-12-01 DIAGNOSIS — R1032 Left lower quadrant pain: Secondary | ICD-10-CM | POA: Diagnosis not present

## 2018-12-01 DIAGNOSIS — R3915 Urgency of urination: Secondary | ICD-10-CM | POA: Diagnosis not present

## 2018-12-01 DIAGNOSIS — Z780 Asymptomatic menopausal state: Secondary | ICD-10-CM

## 2018-12-01 LAB — POCT URINALYSIS DIPSTICK
Bilirubin, UA: NEGATIVE
Glucose, UA: NEGATIVE
Ketones, UA: NEGATIVE
Nitrite, UA: NEGATIVE
Protein, UA: NEGATIVE
Spec Grav, UA: 1.02 (ref 1.010–1.025)
Urobilinogen, UA: 0.2 E.U./dL
pH, UA: 7.5 (ref 5.0–8.0)

## 2018-12-01 MED ORDER — KETOROLAC TROMETHAMINE 60 MG/2ML IM SOLN
60.0000 mg | Freq: Once | INTRAMUSCULAR | Status: AC
  Administered 2018-12-01: 60 mg via INTRAMUSCULAR

## 2018-12-01 MED ORDER — CYCLOBENZAPRINE HCL 5 MG PO TABS: 5.0000 mg | ORAL_TABLET | Freq: Three times a day (TID) | ORAL | 1 refills | Status: DC | PRN

## 2018-12-01 NOTE — Progress Notes (Signed)
Progressive arthritis/disc degeneration. Consider appt with Dr. Darene Lamer in office. I do think if pain persist MRI is warranted.

## 2018-12-01 NOTE — Patient Instructions (Signed)

## 2018-12-01 NOTE — Progress Notes (Signed)
Subjective:    Patient ID: Sue Smith, female    DOB: [birth year], 63 y.o.   MRN: [id number]  HPI  Patient is a 63 year old female with hypertension, COPD, lumbar degenerative disc disease, osteoporosis, chronic pain who presents to the clinic with new symptoms.  She reports most of her symptoms are bilateral low back pain that wraps around to her lower abdomen.  She denies any pain with urination or lower abdominal pressure.  She does say her frequency has increased some.  She denies any fever, chills, nausea.  She has not done anything to make better.  She does have a history of lumbar degenerative disc disease and chronic back pain that she takes oxycodone for 4  times a day.  Her last lumbar x-ray was 2014.  She denies any bilateral leg weakness, saddle anesthesia, bowel or bladder dysfunction.  She has had some sciatica pain in both legs randomly.  She does not remember having an MRI.  She does admit she has been coughing a lot and feeling a lot of instability in her back.  Her coughing is due to her ongoing COPD. She cannot take NSAIDs due to her GI upset and history of ulcer.  .. Active Ambulatory Problems    Diagnosis Date Noted  . Essential hypertension 11/04/2011  . Surgical menopause 11/04/2011  . Vasomotor flushing 11/18/2011  . Pre-diabetes 11/20/2011  . Personal history of colonic polyps 03/26/2012  . Osteoporosis 11/25/2012  . Hyperlipidemia 11/25/2012  . DDD (degenerative disc disease), lumbar 03/23/2013  . Insomnia 11/25/2013  . COPD (chronic obstructive pulmonary disease) with chronic bronchitis ([place]) 02/16/2015  . Simple chronic bronchitis ([place]) 08/27/2016  . Actinic keratosis 11/29/2016  . Chronic pain syndrome 11/29/2016  . Gastroesophageal reflux disease 11/29/2016  . Current smoker 02/26/2017  . Grief 06/30/2017  . No energy 07/29/2018   Resolved Ambulatory Problems    Diagnosis Date Noted  . No Resolved Ambulatory Problems   Past Medical History:   Diagnosis Date  . Cervical dysplasia      Review of Systems    see HPI.  Objective:   Physical Exam Vitals signs reviewed.  Constitutional:      Appearance: Normal appearance.  Cardiovascular:     Rate and Rhythm: Normal rate and regular rhythm.     Pulses: Normal pulses.  Pulmonary:     Effort: Pulmonary effort is normal.     Breath sounds: Rhonchi present.     Comments: Course breath sounds.  Abdominal:     General: Bowel sounds are normal. There is no distension.     Palpations: Abdomen is soft.     Tenderness: There is no abdominal tenderness. There is no right CVA tenderness, left CVA tenderness, guarding or rebound.  Musculoskeletal:     Right lower leg: No edema.     Left lower leg: No edema.     Comments: No tenderness over lumbar spine.  Tenderness and tightness of lumbar paraspinal muscles.  Negative straight leg.  5/5 strength over lower extremity.  2+ symmetric patellar reflexes.   Neurological:     General: No focal deficit present.     Mental Status: She is alert and oriented to person, place, and time.  Psychiatric:        Mood and Affect: Mood normal.           Assessment & Plan:  Marland KitchenMarland KitchenColie was seen today for urinary tract infection.  Diagnoses and all orders for this visit:  Urinary urgency -  POCT urinalysis dipstick -     Urine Culture  Acute left-sided low back pain without sciatica -     POCT urinalysis dipstick -     Urine Culture -     cyclobenzaprine (FLEXERIL) 5 MG tablet; Take 1 tablet (5 mg total) by mouth 3 (three) times daily as needed for muscle spasms. -     ketorolac (TORADOL) injection 60 mg  Acute left lower quadrant pain -     POCT urinalysis dipstick -     Urine Culture -     ketorolac (TORADOL) injection 60 mg  DDD (degenerative disc disease), lumbar -     DG Lumbar Spine Complete -     cyclobenzaprine (FLEXERIL) 5 MG tablet; Take 1 tablet (5 mg total) by mouth 3 (three) times daily as needed for muscle  spasms.  .. Results for orders placed or performed in visit on 12/01/18  Urine Culture   Specimen: Urine  Result Value Ref Range   MICRO NUMBER: [id number]    SPECIMEN QUALITY: Adequate    Sample Source URINE, CLEAN CATCH    STATUS: FINAL    Result: No Growth   POCT urinalysis dipstick  Result Value Ref Range   Color, UA yellow    Clarity, UA clear    Glucose, UA Negative Negative   Bilirubin, UA negative    Ketones, UA negative    Spec Grav, UA 1.020 1.010 - 1.025   Blood, UA small    pH, UA 7.5 5.0 - 8.0   Protein, UA Negative Negative   Urobilinogen, UA 0.2 0.2 or 1.0 E.U./dL   Nitrite, UA negative    Leukocytes, UA Small (1+) (A) Negative   Appearance     Odor     UA small leuks. I do not suspect UTI. Will culture for confirmation.    There has likely been some changes in her spine with her ongoing lumbar degenerative disc disease.  I think she should get a repeat x-ray.  This was ordered today.  Patient cannot take oral NSAIDs.  I did give her a shot of Toradol today.  I also gave her muscle relaxer that she can use as needed.  Discussed the sedation potential.  Urged her to start core and low back strengthening exercises.  Certainly consider massage.  Consider TENS unit, icy hot patches, heat.  If not improving she should consider sports medicine appointment and possible MRI with further planning for pain management.

## 2018-12-03 ENCOUNTER — Encounter: Payer: Self-pay | Admitting: Physician Assistant

## 2018-12-03 LAB — URINE CULTURE
MICRO NUMBER:: 1111360
Result:: NO GROWTH
SPECIMEN QUALITY:: ADEQUATE

## 2018-12-03 NOTE — Progress Notes (Signed)
Urine culture showed no growth of any bacteria.

## 2018-12-04 ENCOUNTER — Encounter: Payer: Self-pay | Admitting: Physician Assistant

## 2018-12-16 ENCOUNTER — Other Ambulatory Visit: Payer: Self-pay

## 2018-12-16 ENCOUNTER — Ambulatory Visit (INDEPENDENT_AMBULATORY_CARE_PROVIDER_SITE_OTHER): Payer: BC Managed Care – PPO | Admitting: Physician Assistant

## 2018-12-16 ENCOUNTER — Encounter: Payer: Self-pay | Admitting: Physician Assistant

## 2018-12-16 VITALS — BP 130/88 | HR 80 | Ht 67.0 in | Wt 130.0 lb

## 2018-12-16 DIAGNOSIS — I10 Essential (primary) hypertension: Secondary | ICD-10-CM | POA: Diagnosis not present

## 2018-12-16 DIAGNOSIS — E782 Mixed hyperlipidemia: Secondary | ICD-10-CM | POA: Diagnosis not present

## 2018-12-16 DIAGNOSIS — M810 Age-related osteoporosis without current pathological fracture: Secondary | ICD-10-CM

## 2018-12-16 DIAGNOSIS — G894 Chronic pain syndrome: Secondary | ICD-10-CM

## 2018-12-16 DIAGNOSIS — M5136 Other intervertebral disc degeneration, lumbar region: Secondary | ICD-10-CM

## 2018-12-16 DIAGNOSIS — Z23 Encounter for immunization: Secondary | ICD-10-CM | POA: Diagnosis not present

## 2018-12-16 DIAGNOSIS — Z0000 Encounter for general adult medical examination without abnormal findings: Secondary | ICD-10-CM | POA: Diagnosis not present

## 2018-12-16 DIAGNOSIS — K219 Gastro-esophageal reflux disease without esophagitis: Secondary | ICD-10-CM | POA: Diagnosis not present

## 2018-12-16 DIAGNOSIS — J449 Chronic obstructive pulmonary disease, unspecified: Secondary | ICD-10-CM | POA: Diagnosis not present

## 2018-12-16 DIAGNOSIS — R7303 Prediabetes: Secondary | ICD-10-CM | POA: Diagnosis not present

## 2018-12-16 MED ORDER — OXYCODONE-ACETAMINOPHEN 10-325 MG PO TABS: 1.0000 | ORAL_TABLET | Freq: Four times a day (QID) | ORAL | 0 refills | Status: DC | PRN

## 2018-12-16 MED ORDER — OXYCODONE-ACETAMINOPHEN 10-325 MG PO TABS: 0.5000 | ORAL_TABLET | Freq: Four times a day (QID) | ORAL | 0 refills | Status: DC | PRN

## 2018-12-16 NOTE — Patient Instructions (Signed)
Health Maintenance, Female Adopting a healthy lifestyle and getting preventive care are important in promoting health and wellness. Ask your health care provider about:  The right schedule for you to have regular tests and exams.  Things you can do on your own to prevent diseases and keep yourself healthy. What should I know about diet, weight, and exercise? Eat a healthy diet   Eat a diet that includes plenty of vegetables, fruits, low-fat dairy products, and lean protein.  Do not eat a lot of foods that are high in solid fats, added sugars, or sodium. Maintain a healthy weight Body mass index (BMI) is used to identify weight problems. It estimates body fat based on height and weight. Your health care provider can help determine your BMI and help you achieve or maintain a healthy weight. Get regular exercise Get regular exercise. This is one of the most important things you can do for your health. Most adults should:  Exercise for at least 150 minutes each week. The exercise should increase your heart rate and make you sweat (moderate-intensity exercise).  Do strengthening exercises at least twice a week. This is in addition to the moderate-intensity exercise.  Spend less time sitting. Even light physical activity can be beneficial. Watch cholesterol and blood lipids Have your blood tested for lipids and cholesterol at 61 years of age, then have this test every 5 years. Have your cholesterol levels checked more often if:  Your lipid or cholesterol levels are high.  You are older than 61 years of age.  You are at high risk for heart disease. What should I know about cancer screening? Depending on your health history and family history, you may need to have cancer screening at various ages. This may include screening for:  Breast cancer.  Cervical cancer.  Colorectal cancer.  Skin cancer.  Lung cancer. What should I know about heart disease, diabetes, and high blood  pressure? Blood pressure and heart disease  High blood pressure causes heart disease and increases the risk of stroke. This is more likely to develop in people who have high blood pressure readings, are of African descent, or are overweight.  Have your blood pressure checked: ? Every 3-5 years if you are 18-39 years of age. ? Every year if you are 40 years old or older. Diabetes Have regular diabetes screenings. This checks your fasting blood sugar level. Have the screening done:  Once every three years after age 40 if you are at a normal weight and have a low risk for diabetes.  More often and at a younger age if you are overweight or have a high risk for diabetes. What should I know about preventing infection? Hepatitis B If you have a higher risk for hepatitis B, you should be screened for this virus. Talk with your health care provider to find out if you are at risk for hepatitis B infection. Hepatitis C Testing is recommended for:  Everyone born from 1945 through 1965.  Anyone with known risk factors for hepatitis C. Sexually transmitted infections (STIs)  Get screened for STIs, including gonorrhea and chlamydia, if: ? You are sexually active and are younger than 61 years of age. ? You are older than 61 years of age and your health care provider tells you that you are at risk for this type of infection. ? Your sexual activity has changed since you were last screened, and you are at increased risk for chlamydia or gonorrhea. Ask your health care provider if   you are at risk.  Ask your health care provider about whether you are at high risk for HIV. Your health care provider may recommend a prescription medicine to help prevent HIV infection. If you choose to take medicine to prevent HIV, you should first get tested for HIV. You should then be tested every 3 months for as long as you are taking the medicine. Pregnancy  If you are about to stop having your period (premenopausal) and  you may become pregnant, seek counseling before you get pregnant.  Take 400 to 800 micrograms (mcg) of folic acid every day if you become pregnant.  Ask for birth control (contraception) if you want to prevent pregnancy. Osteoporosis and menopause Osteoporosis is a disease in which the bones lose minerals and strength with aging. This can result in bone fractures. If you are 65 years old or older, or if you are at risk for osteoporosis and fractures, ask your health care provider if you should:  Be screened for bone loss.  Take a calcium or vitamin D supplement to lower your risk of fractures.  Be given hormone replacement therapy (HRT) to treat symptoms of menopause. Follow these instructions at home: Lifestyle  Do not use any products that contain nicotine or tobacco, such as cigarettes, e-cigarettes, and chewing tobacco. If you need help quitting, ask your health care provider.  Do not use street drugs.  Do not share needles.  Ask your health care provider for help if you need support or information about quitting drugs. Alcohol use  Do not drink alcohol if: ? Your health care provider tells you not to drink. ? You are pregnant, may be pregnant, or are planning to become pregnant.  If you drink alcohol: ? Limit how much you use to 0-1 drink a day. ? Limit intake if you are breastfeeding.  Be aware of how much alcohol is in your drink. In the [place] one drink equals one 12 oz bottle of beer (355 mL), one 5 oz glass of wine (148 mL), or one 1 oz glass of hard liquor (44 mL). General instructions  Schedule regular health, dental, and eye exams.  Stay current with your vaccines.  Tell your health care provider if: ? You often feel depressed. ? You have ever been abused or do not feel safe at home. Summary  Adopting a healthy lifestyle and getting preventive care are important in promoting health and wellness.  Follow your health care provider's instructions about healthy  diet, exercising, and getting tested or screened for diseases.  Follow your health care provider's instructions on monitoring your cholesterol and blood pressure. This information is not intended to replace advice given to you by your health care provider. Make sure you discuss any questions you have with your health care provider. Document Released: 07/16/2010 Document Revised: 12/24/2017 Document Reviewed: 12/24/2017 Elsevier Patient Education  2020 [employer]  

## 2018-12-16 NOTE — Progress Notes (Signed)
Subjective:     Sue Smith is a 63 y.o. female and is here for a comprehensive physical exam. The patient reports no problems.  Social History   Socioeconomic History  . Marital status: Widowed    Spouse name: Not on file  . Number of children: Not on file  . Years of education: Not on file  . Highest education level: Not on file  Occupational History  . Not on file  Social Needs  . Financial resource strain: Not on file  . Food insecurity    Worry: Not on file    Inability: Not on file  . Transportation needs    Medical: Not on file    Non-medical: Not on file  Tobacco Use  . Smoking status: Current Every Day Smoker    Packs/day: 0.50    Types: Cigarettes  . Smokeless tobacco: Never Used  Substance and Sexual Activity  . Alcohol use: Yes    Alcohol/week: 2.0 standard drinks    Types: 2 Standard drinks or equivalent per week  . Drug use: No  . Sexual activity: Not on file  Lifestyle  . Physical activity    Days per week: Not on file    Minutes per session: Not on file  . Stress: Not on file  Relationships  . Social Herbalist on phone: Not on file    Gets together: Not on file    Attends religious service: Not on file    Active member of club or organization: Not on file    Attends meetings of clubs or organizations: Not on file    Relationship status: Not on file  . Intimate partner violence    Fear of current or ex partner: Not on file    Emotionally abused: Not on file    Physically abused: Not on file    Forced sexual activity: Not on file  Other Topics Concern  . Not on file  Social History Narrative  . Not on file   Health Maintenance  Topic Date Due  . HIV Screening  08/31/2019 (Originally [birth year]/1972)  . PAP SMEAR-Modifier  11/30/2045 (Originally 07/27/1978)  . DEXA SCAN  01/09/2019  . MAMMOGRAM  01/10/2020  . COLONOSCOPY  07/01/2022  . TETANUS/TDAP  07/28/2028  . INFLUENZA VACCINE  Completed  . Hepatitis C Screening  Completed     The following portions of the patient's history were reviewed and updated as appropriate: allergies, current medications, past family history, past medical history, past social history, past surgical history and problem list.  Review of Systems A comprehensive review of systems was negative.   Objective:    BP 130/88   Pulse 80   Ht 5\' 7"  (1.702 m)   Wt 130 lb (59 kg)   SpO2 95%   BMI 20.36 kg/m  General appearance: alert, cooperative and appears stated age Head: Normocephalic, without obvious abnormality, atraumatic Eyes: conjunctivae/corneas clear. PERRL, EOM's intact. Fundi benign. Ears: normal TM's and external ear canals both ears Nose: Nares normal. Septum midline. Mucosa normal. No drainage or sinus tenderness. Throat: lips, mucosa, and tongue normal; teeth and gums normal Neck: no adenopathy, no carotid bruit, no JVD, supple, symmetrical, trachea midline and thyroid not enlarged, symmetric, no tenderness/mass/nodules Back: symmetric, no curvature. ROM normal. No CVA tenderness. Lungs: clear to auscultation bilaterally Heart: regular rate and rhythm, S1, S2 normal, no murmur, click, rub or gallop Abdomen: soft, non-tender; bowel sounds normal; no masses,  no organomegaly Extremities: extremities normal,  atraumatic, no cyanosis or edema Pulses: 2+ and symmetric Skin: Skin color, texture, turgor normal. No rashes or lesions Lymph nodes: Cervical, supraclavicular, and axillary nodes normal. Neurologic: Alert and oriented X 3, normal strength and tone. Normal symmetric reflexes. Normal coordination and gait    Assessment:    Healthy female exam.      Plan:      Marland KitchenMarland KitchenZaleah was seen today for annual exam.  Diagnoses and all orders for this visit:  Routine physical examination -     Lipid Panel w/reflex Direct LDL -     COMPLETE METABOLIC PANEL WITH GFR -     CBC -     Hemoglobin A1c  Essential hypertension -     COMPLETE METABOLIC PANEL WITH GFR  COPD (chronic  obstructive pulmonary disease) with chronic bronchitis (HCC) -     CBC  Gastroesophageal reflux disease, unspecified whether esophagitis present  Age-related osteoporosis without current pathological fracture  Mixed hyperlipidemia -     Lipid Panel w/reflex Direct LDL -     COMPLETE METABOLIC PANEL WITH GFR  Pre-diabetes -     COMPLETE METABOLIC PANEL WITH GFR -     Hemoglobin A1c  Chronic pain syndrome -     oxyCODONE-acetaminophen (PERCOCET) 10-325 MG tablet; Take 1 tablet by mouth every 6 (six) hours as needed for pain. -     oxyCODONE-acetaminophen (PERCOCET) 10-325 MG tablet; Take 1 tablet by mouth every 6 (six) hours as needed for pain. -     oxyCODONE-acetaminophen (PERCOCET) 10-325 MG tablet; Take 0.5-1 tablets by mouth every 6 (six) hours as needed for pain.  DDD (degenerative disc disease), lumbar -     oxyCODONE-acetaminophen (PERCOCET) 10-325 MG tablet; Take 1 tablet by mouth every 6 (six) hours as needed for pain. -     oxyCODONE-acetaminophen (PERCOCET) 10-325 MG tablet; Take 1 tablet by mouth every 6 (six) hours as needed for pain. -     oxyCODONE-acetaminophen (PERCOCET) 10-325 MG tablet; Take 0.5-1 tablets by mouth every 6 (six) hours as needed for pain.  Need for pneumococcal vaccination -     PNEUMOCCAL 23   .Marland Kitchen Depression screen [facility] 2/9 12/16/2018 11/04/2018 02/04/2018 01/13/2018 09/30/2017  Decreased Interest 0 0 0 0 0  Down, Depressed, Hopeless 0 0 0 0 0  PHQ - 2 Score 0 0 0 0 0  Altered sleeping 0 0 0 0 0  Tired, decreased energy 0 0 0 1 0  Change in appetite 0 0 0 1 0  Feeling bad or failure about yourself  0 0 0 0 0  Trouble concentrating 0 0 0 0 0  Moving slowly or fidgety/restless 0 0 0 0 0  Suicidal thoughts 0 0 0 0 0  PHQ-9 Score 0 0 0 2 0  Difficult doing work/chores Not difficult at all Not difficult at all Not difficult at all Not difficult at all Not difficult at all    Discussed 150 minutes of exercise a week.  Encouraged vitamin D 1000  units and Calcium 1300mg  or 4 servings of dairy a day.  Fasting labs ordered. mammo scheduled.  dexa scheduled. On fosamax. Colonoscopy UTD.  Flu/shingles UTD.  Pneumonia 23 given today.   Pt is tapering slowing off cigarettes. Down to 2 a day.   Oxycodone refilled. On pain contract.  ..PDMP reviewed during this encounter. Follow up in April.   See After Visit Summary for Counseling Recommendations

## 2018-12-17 LAB — COMPLETE METABOLIC PANEL WITH GFR
AG Ratio: 1.8 (calc) (ref 1.0–2.5)
ALT: 9 U/L (ref 6–29)
AST: 10 U/L (ref 10–35)
Albumin: 4.2 g/dL (ref 3.6–5.1)
Alkaline phosphatase (APISO): 58 U/L (ref 37–153)
BUN: 10 mg/dL (ref 7–25)
CO2: 28 mmol/L (ref 20–32)
Calcium: 9.7 mg/dL (ref 8.6–10.4)
Chloride: 105 mmol/L (ref 98–110)
Creat: 0.93 mg/dL (ref 0.50–0.99)
GFR, Est African American: 77 mL/min/{1.73_m2} (ref >=60)
GFR, Est Non African American: 66 mL/min/{1.73_m2} (ref >=60)
Globulin: 2.4 g/dL (calc) (ref 1.9–3.7)
Glucose, Bld: 98 mg/dL (ref 65–99)
Potassium: 4 mmol/L (ref 3.5–5.3)
Sodium: 142 mmol/L (ref 135–146)
Total Bilirubin: 0.5 mg/dL (ref 0.2–1.2)
Total Protein: 6.6 g/dL (ref 6.1–8.1)

## 2018-12-17 LAB — CBC
HCT: 44.3 % (ref 35.0–45.0)
Hemoglobin: 15.2 g/dL (ref 11.7–15.5)
MCH: 33 pg (ref 27.0–33.0)
MCHC: 34.3 g/dL (ref 32.0–36.0)
MCV: 96.1 fL (ref 80.0–100.0)
MPV: 11.3 fL (ref 7.5–12.5)
Platelets: 221 10*3/uL (ref 140–400)
RBC: 4.61 10*6/uL (ref 3.80–5.10)
RDW: 12 % (ref 11.0–15.0)
WBC: 7.2 10*3/uL (ref 3.8–10.8)

## 2018-12-17 LAB — LIPID PANEL W/REFLEX DIRECT LDL
Cholesterol: 158 mg/dL (ref <200)
HDL: 52 mg/dL (ref >=50)
LDL Cholesterol (Calc): 87 mg/dL (calc)
Non-HDL Cholesterol (Calc): 106 mg/dL (calc) (ref <130)
Total CHOL/HDL Ratio: 3 (calc) (ref <5.0)
Triglycerides: 96 mg/dL (ref <150)

## 2018-12-17 LAB — HEMOGLOBIN A1C
Hgb A1c MFr Bld: 5.5 % of total Hgb (ref <5.7)
Mean Plasma Glucose: 111 (calc)
eAG (mmol/L): 6.2 (calc)

## 2018-12-17 NOTE — Progress Notes (Signed)
Sue Smith,   Cholesterol looks good. Kidney, liver, glucose look good. A!C looks great.   Luvenia Starch

## 2019-01-13 ENCOUNTER — Other Ambulatory Visit: Payer: Self-pay

## 2019-01-13 ENCOUNTER — Ambulatory Visit (INDEPENDENT_AMBULATORY_CARE_PROVIDER_SITE_OTHER): Payer: BC Managed Care – PPO

## 2019-01-13 DIAGNOSIS — Z780 Asymptomatic menopausal state: Secondary | ICD-10-CM

## 2019-01-13 DIAGNOSIS — M810 Age-related osteoporosis without current pathological fracture: Secondary | ICD-10-CM

## 2019-01-13 DIAGNOSIS — Z1231 Encounter for screening mammogram for malignant neoplasm of breast: Secondary | ICD-10-CM

## 2019-01-13 MED ORDER — ALENDRONATE SODIUM 70 MG PO TABS: ORAL_TABLET | ORAL | 3 refills | Status: DC

## 2019-01-13 NOTE — Progress Notes (Unsigned)
Patient ID: Sue Smith, female   DOB: Dec 07, 1955, 63 y.o.   MRN: [id number] Fosamax sent for 1 year.

## 2019-01-13 NOTE — Progress Notes (Signed)
Your are in osteoporotic range. Your Tscore is -2.6 it has improved in 2 years from -3.1. continue to take fosamax. I will send refill.   -Luvenia Starch

## 2019-01-13 NOTE — Progress Notes (Signed)
Normal mammogram follow up in 1 year.

## 2019-01-17 ENCOUNTER — Other Ambulatory Visit: Payer: Self-pay | Admitting: Physician Assistant

## 2019-01-17 DIAGNOSIS — E7800 Pure hypercholesterolemia, unspecified: Secondary | ICD-10-CM

## 2019-01-17 DIAGNOSIS — J441 Chronic obstructive pulmonary disease with (acute) exacerbation: Secondary | ICD-10-CM

## 2019-02-23 ENCOUNTER — Encounter: Payer: Self-pay | Admitting: Physician Assistant

## 2019-02-23 ENCOUNTER — Telehealth (INDEPENDENT_AMBULATORY_CARE_PROVIDER_SITE_OTHER): Payer: BC Managed Care – PPO | Admitting: Physician Assistant

## 2019-02-23 VITALS — Temp 98.3°F | Ht 67.0 in | Wt 130.0 lb

## 2019-02-23 DIAGNOSIS — J0141 Acute recurrent pansinusitis: Secondary | ICD-10-CM | POA: Diagnosis not present

## 2019-02-23 DIAGNOSIS — J40 Bronchitis, not specified as acute or chronic: Secondary | ICD-10-CM

## 2019-02-23 DIAGNOSIS — J441 Chronic obstructive pulmonary disease with (acute) exacerbation: Secondary | ICD-10-CM | POA: Diagnosis not present

## 2019-02-23 DIAGNOSIS — J329 Chronic sinusitis, unspecified: Secondary | ICD-10-CM

## 2019-02-23 MED ORDER — LEVOFLOXACIN 500 MG PO TABS: 500.0000 mg | ORAL_TABLET | Freq: Every day | ORAL | 0 refills | Status: DC

## 2019-02-23 MED ORDER — PREDNISONE 20 MG PO TABS: ORAL_TABLET | ORAL | 0 refills | Status: DC

## 2019-02-23 NOTE — Progress Notes (Signed)
Patient ID: Sue Smith, female   DOB: [birth year], 64 y.o.   MRN: [id number] .Marland KitchenVirtual Visit via Video Note  I connected with Sue Smith on 02/23/2019 at  1:40 PM EST by a video enabled telemedicine application and verified that I am speaking with the correct person using two identifiers.  Location: Patient: home Provider: clinic   I discussed the limitations of evaluation and management by telemedicine and the availability of in person appointments. The patient expressed understanding and agreed to proceed.  History of Present Illness: Patient is a 64 year old female with COPD and recurrent sinus infections who calls into the clinic with over a week of sinus pressure, nasal congestion, ear popping, productive cough, worsening shortness of breath.  She denies any sick contacts.  She denies any direct Covid exposure.  She denies any loss of smell or taste, GI symptoms.  She continues to smoke 2 cigarettes a day.  She does not have any fever or body aches.  She occasionally does feel like she has some chills.  She has been taking over-the-counter Mucinex and flonase without any relief.   .. Active Ambulatory Problems    Diagnosis Date Noted  . Essential hypertension 11/04/2011  . Surgical menopause 11/04/2011  . Vasomotor flushing 11/18/2011  . Pre-diabetes 11/20/2011  . Personal history of colonic polyps 03/26/2012  . Osteoporosis 11/25/2012  . Hyperlipidemia 11/25/2012  . DDD (degenerative disc disease), lumbar 03/23/2013  . Insomnia 11/25/2013  . COPD (chronic obstructive pulmonary disease) with chronic bronchitis ([place]) 02/16/2015  . Simple chronic bronchitis ([place]) 08/27/2016  . Actinic keratosis 11/29/2016  . Chronic pain syndrome 11/29/2016  . Gastroesophageal reflux disease 11/29/2016  . Current smoker 02/26/2017  . Grief 06/30/2017  . No energy 07/29/2018   Resolved Ambulatory Problems    Diagnosis Date Noted  . No Resolved Ambulatory Problems   Past Medical  History:  Diagnosis Date  . Cervical dysplasia    Reviewed med, allergy, problem list.      Observations/Objective: No acute distress.  Normal mood and appearance.  Dry to wet cough.  No labored breathing.   .. Today's Vitals   02/23/19 1321  Temp: 98.3 F (36.8 C)  TempSrc: Oral  Weight: 130 lb (59 kg)  Height: 5\' 7"  (1.702 m)   Body mass index is 20.36 kg/m.    Assessment and Plan: Marland KitchenMarland KitchenKeystal was seen today for sinus problem.  Diagnoses and all orders for this visit:  Sinobronchitis -     levofloxacin (LEVAQUIN) 500 MG tablet; Take 1 tablet (500 mg total) by mouth daily. -     predniSONE (DELTASONE) 20 MG tablet; Take 3 tablets for 3 days, take 2 tablets for 3 days, take 1 tablet for 3 days, take 1/2 tablet for 4 days.  Acute recurrent pansinusitis -     levofloxacin (LEVAQUIN) 500 MG tablet; Take 1 tablet (500 mg total) by mouth daily. -     predniSONE (DELTASONE) 20 MG tablet; Take 3 tablets for 3 days, take 2 tablets for 3 days, take 1 tablet for 3 days, take 1/2 tablet for 4 days.  COPD exacerbation (HCC) -     levofloxacin (LEVAQUIN) 500 MG tablet; Take 1 tablet (500 mg total) by mouth daily. -     predniSONE (DELTASONE) 20 MG tablet; Take 3 tablets for 3 days, take 2 tablets for 3 days, take 1 tablet for 3 days, take 1/2 tablet for 4 days.   Recurrent sinus infection going into bronchitis. Pt has multiple antibiotic  intolerances. Treated with levaquin. Added prednisone due to worsening SOB and COPD status. Rest and hydrate.continue mucinex. Follow up as needed.    Follow Up Instructions:    I discussed the assessment and treatment plan with the patient. The patient was provided an opportunity to ask questions and all were answered. The patient agreed with the plan and demonstrated an understanding of the instructions.   The patient was advised to call back or seek an in-person evaluation if the symptoms worsen or if the condition fails to improve as  anticipated.  I provided 12 minutes of non-face-to-face time during this encounter.   Iran Planas, PA-C

## 2019-02-23 NOTE — Progress Notes (Deleted)
Started Thursday: Sinus pressure under eyes/ears Headache Dry/runny nose Fatigue cough  Has tried sinus meds

## 2019-03-05 ENCOUNTER — Encounter: Payer: Self-pay | Admitting: Physician Assistant

## 2019-03-05 MED ORDER — PREDNISONE 10 MG PO TABS: 10.0000 mg | ORAL_TABLET | Freq: Every day | ORAL | 0 refills | Status: DC

## 2019-03-08 DIAGNOSIS — R05 Cough: Secondary | ICD-10-CM | POA: Diagnosis not present

## 2019-03-08 DIAGNOSIS — J441 Chronic obstructive pulmonary disease with (acute) exacerbation: Secondary | ICD-10-CM | POA: Diagnosis not present

## 2019-03-08 DIAGNOSIS — J3089 Other allergic rhinitis: Secondary | ICD-10-CM | POA: Diagnosis not present

## 2019-03-08 DIAGNOSIS — F17210 Nicotine dependence, cigarettes, uncomplicated: Secondary | ICD-10-CM | POA: Diagnosis not present

## 2019-03-09 MED ORDER — LOSARTAN POTASSIUM 50 MG PO TABS: 50.0000 mg | ORAL_TABLET | Freq: Every day | ORAL | 1 refills | Status: DC

## 2019-03-09 NOTE — Addendum Note (Signed)
Addended by: Donella Stade on: 03/09/2019 12:44 PM   Modules accepted: Orders

## 2019-03-21 ENCOUNTER — Encounter: Payer: Self-pay | Admitting: Physician Assistant

## 2019-03-22 DIAGNOSIS — Z20828 Contact with and (suspected) exposure to other viral communicable diseases: Secondary | ICD-10-CM | POA: Diagnosis not present

## 2019-04-21 DIAGNOSIS — J441 Chronic obstructive pulmonary disease with (acute) exacerbation: Secondary | ICD-10-CM | POA: Diagnosis not present

## 2019-04-21 DIAGNOSIS — J3089 Other allergic rhinitis: Secondary | ICD-10-CM | POA: Diagnosis not present

## 2019-04-21 DIAGNOSIS — R05 Cough: Secondary | ICD-10-CM | POA: Diagnosis not present

## 2019-04-21 DIAGNOSIS — F17210 Nicotine dependence, cigarettes, uncomplicated: Secondary | ICD-10-CM | POA: Diagnosis not present

## 2019-05-05 ENCOUNTER — Ambulatory Visit: Payer: BC Managed Care – PPO | Admitting: Physician Assistant

## 2019-05-05 ENCOUNTER — Other Ambulatory Visit: Payer: Self-pay

## 2019-05-05 ENCOUNTER — Encounter: Payer: Self-pay | Admitting: Physician Assistant

## 2019-05-05 VITALS — BP 160/57 | HR 97 | Ht 67.0 in | Wt 139.0 lb

## 2019-05-05 DIAGNOSIS — M5136 Other intervertebral disc degeneration, lumbar region: Secondary | ICD-10-CM | POA: Diagnosis not present

## 2019-05-05 DIAGNOSIS — I493 Ventricular premature depolarization: Secondary | ICD-10-CM

## 2019-05-05 DIAGNOSIS — I499 Cardiac arrhythmia, unspecified: Secondary | ICD-10-CM

## 2019-05-05 DIAGNOSIS — I10 Essential (primary) hypertension: Secondary | ICD-10-CM

## 2019-05-05 DIAGNOSIS — I491 Atrial premature depolarization: Secondary | ICD-10-CM

## 2019-05-05 DIAGNOSIS — M51369 Other intervertebral disc degeneration, lumbar region without mention of lumbar back pain or lower extremity pain: Secondary | ICD-10-CM

## 2019-05-05 DIAGNOSIS — G894 Chronic pain syndrome: Secondary | ICD-10-CM

## 2019-05-05 MED ORDER — OXYCODONE-ACETAMINOPHEN 10-325 MG PO TABS: 1.0000 | ORAL_TABLET | Freq: Four times a day (QID) | ORAL | 0 refills | Status: DC | PRN

## 2019-05-05 MED ORDER — LOSARTAN POTASSIUM 50 MG PO TABS: 50.0000 mg | ORAL_TABLET | Freq: Every day | ORAL | 1 refills | Status: DC

## 2019-05-05 NOTE — Progress Notes (Signed)
Subjective:    Patient ID: Sue Smith, female    DOB: [birth year], 64 y.o.   MRN: [id number]  HPI  Pt is a 64 yo female with COPD, HTN, Chronic pain who presents to the clinic for pain medication follow up.   Pain is controlled. No problems or concerns.   COPD-seeing pulmonology regularly. On prednisone 10mg  daily. Cutting back on smoking but still having 1-2 a day. Continues to have productive cough and SOB. Next pulmonology appt May 5th.   No CP, headaches, vision changes. She has noticed some palpitations recently.   .. Active Ambulatory Problems    Diagnosis Date Noted  . Essential hypertension 11/04/2011  . Surgical menopause 11/04/2011  . Vasomotor flushing 11/18/2011  . Pre-diabetes 11/20/2011  . Personal history of colonic polyps 03/26/2012  . Osteoporosis 11/25/2012  . Hyperlipidemia 11/25/2012  . DDD (degenerative disc disease), lumbar 03/23/2013  . Insomnia 11/25/2013  . COPD (chronic obstructive pulmonary disease) with chronic bronchitis ([place]) 02/16/2015  . Simple chronic bronchitis ([place]) 08/27/2016  . Actinic keratosis 11/29/2016  . Chronic pain syndrome 11/29/2016  . Gastroesophageal reflux disease 11/29/2016  . Current smoker 02/26/2017  . Grief 06/30/2017  . No energy 07/29/2018  . Irregular heart rhythm 05/06/2019  . PAC (premature atrial contraction) 05/06/2019  . PVC (premature ventricular contraction) 05/06/2019   Resolved Ambulatory Problems    Diagnosis Date Noted  . No Resolved Ambulatory Problems   Past Medical History:  Diagnosis Date  . Cervical dysplasia       Review of Systems See HPI.     Objective:   Physical Exam Vitals reviewed.  Constitutional:      Appearance: Normal appearance.  Cardiovascular:     Rate and Rhythm: Normal rate. Rhythm irregular.     Pulses: Normal pulses.  Pulmonary:     Effort: Pulmonary effort is normal.     Comments: Coarse breath sounds.  Neurological:     General: No focal deficit present.      Mental Status: She is alert and oriented to person, place, and time.  Psychiatric:        Mood and Affect: Mood normal.           Assessment & Plan:  Marland KitchenMarland KitchenChannon was seen today for follow-up.  Diagnoses and all orders for this visit:  Chronic pain syndrome -     Pain Mgmt, Profile 6 Conf w/o mM, U -     oxyCODONE-acetaminophen (PERCOCET) 10-325 MG tablet; Take 1 tablet by mouth every 6 (six) hours as needed for pain. -     oxyCODONE-acetaminophen (PERCOCET) 10-325 MG tablet; Take 1 tablet by mouth every 6 (six) hours as needed for pain. -     oxyCODONE-acetaminophen (PERCOCET) 10-325 MG tablet; Take 1 tablet by mouth every 6 (six) hours as needed for pain.  DDD (degenerative disc disease), lumbar -     oxyCODONE-acetaminophen (PERCOCET) 10-325 MG tablet; Take 1 tablet by mouth every 6 (six) hours as needed for pain. -     oxyCODONE-acetaminophen (PERCOCET) 10-325 MG tablet; Take 1 tablet by mouth every 6 (six) hours as needed for pain. -     oxyCODONE-acetaminophen (PERCOCET) 10-325 MG tablet; Take 1 tablet by mouth every 6 (six) hours as needed for pain.  Irregular heart rhythm -     EKG 12-Lead -     Ambulatory referral to Cardiology  PAC (premature atrial contraction) -     Ambulatory referral to Cardiology  PVC (premature ventricular contraction) -  Ambulatory referral to Cardiology  Essential hypertension -     losartan (COZAAR) 50 MG tablet; Take 1 tablet (50 mg total) by mouth daily. -     Ambulatory referral to Cardiology    COPD- managed by pulmonology.   BP elevated but on prednisone.   Chronic pain updated contract today.  UDS ordered.  Marland KitchenMarland KitchenPDMP reviewed during this encounter. No concerns.  Refilled for 3 months.   Irregular regular heartbeat on auscultation.  EKG showed PAC and PVC and some anterior lead changes. Will send to cardiology for more work up. Recheck BP at cardiology. Pt is symptomatic at times. HO given and discussed smoking cessation,  limit caffeine and alcohol.

## 2019-05-05 NOTE — Patient Instructions (Signed)
Premature Ventricular Contraction  A premature ventricular contraction (PVC) is a common kind of irregular heartbeat (arrhythmia). These contractions are extra heartbeats that start in the ventricles of the heart and occur too early in the normal sequence. During the PVC, the heart's normal electrical pathway is not used, so the beat is shorter and less effective. In most cases, these contractions come and go and do not require treatment. What are the causes? Common causes of the condition include:  Smoking.  Drinking alcohol.  Certain medicines.  Some illegal drugs.  Stress.  Caffeine. Certain medical conditions can also cause PVCs:  Heart failure.  Heart attack, or coronary artery disease.  Heart valve problems.  Changes in minerals in the blood (electrolytes).  Low blood oxygen levels or high carbon dioxide levels. In many cases, the cause of this condition is not known. What are the signs or symptoms? The main symptom of this condition is fast or skipped heartbeats (palpitations). Other symptoms include:  Chest pain.  Shortness of breath.  Feeling tired.  Dizziness.  Difficulty exercising. In some cases, there are no symptoms. How is this diagnosed? This condition may be diagnosed based on:  Your medical history.  A physical exam. During the exam, the health care provider will check for irregular heartbeats.  Tests, such as: ? An ECG (electrocardiogram) to monitor the electrical activity of your heart. ? An ambulatory cardiac monitor. This device records your heartbeats for 24 hours or more. ? Stress tests to see how exercise affects your heart rhythm and blood supply. ? An echocardiogram. This test uses sound waves (ultrasound) to produce an image of your heart. ? An electrophysiology study (EPS). This test checks for electrical problems in your heart. How is this treated? Treatment for this condition depends on any underlying conditions, the type of PVCs  that you are having, and how much the symptoms are interfering with your daily life. Possible treatments include:  Avoiding things that cause premature contractions (triggers). These include caffeine and alcohol.  Taking medicines if symptoms are severe or if the extra heartbeats are frequent.  Getting treatment for underlying conditions that cause PVCs.  Having an implantable cardioverter defibrillator (ICD), if you are at risk for a serious arrhythmia. The ICD is a small device that is inserted into your chest to monitor your heartbeat. When it senses an irregular heartbeat, it sends a shock to bring the heartbeat back to normal.  Having a procedure to destroy the portion of the heart tissue that sends out abnormal signals (catheter ablation). In some cases, no treatment is required. Follow these instructions at home: Lifestyle  Do not use any products that contain nicotine or tobacco, such as cigarettes, e-cigarettes, and chewing tobacco. If you need help quitting, ask your health care provider.  Do not use illegal drugs.  Exercise regularly. Ask your health care provider what type of exercise is safe for you.  Try to get at least 7-9 hours of sleep each night, or as much as recommended by your health care provider.  Find healthy ways to manage stress. Avoid stressful situations when possible. Alcohol use  Do not drink alcohol if: ? Your health care provider tells you not to drink. ? You are pregnant, may be pregnant, or are planning to become pregnant. ? Alcohol triggers your episodes.  If you drink alcohol: ? Limit how much you use to:  0-1 drink a day for women.  0-2 drinks a day for men.  Be aware of how much   alcohol is in your drink. In the [place] one drink equals one 12 oz bottle of beer (355 mL), one 5 oz glass of wine (148 mL), or one 1½ oz glass of hard liquor (44 mL). °General instructions °· Take over-the-counter and prescription medicines only as told by your  health care provider. °· If caffeine triggers episodes of PVC, do not eat, drink, or use anything with caffeine in it. °· Keep all follow-up visits as told by your health care provider. This is important. °Contact a health care provider if you: °· Feel palpitations. °Get help right away if you: °· Have chest pain. °· Have shortness of breath. °· Have sweating for no reason. °· Have nausea and vomiting. °· Become light-headed or you faint. °Summary °· A premature ventricular contraction (PVC) is a common kind of irregular heartbeat (arrhythmia). °· In most cases, these contractions come and go and do not require treatment. °· You may need to wear an ambulatory cardiac monitor. This records your heartbeats for 24 hours or more. °· Treatment depends on any underlying conditions, the type of PVCs that you are having, and how much the symptoms are interfering with your daily life. °This information is not intended to replace advice given to you by your health care provider. Make sure you discuss any questions you have with your health care provider. °Document Revised: 09/25/2017 Document Reviewed: 09/25/2017 °Elsevier Patient Education © 2020 [employer] ° °

## 2019-05-06 DIAGNOSIS — I499 Cardiac arrhythmia, unspecified: Secondary | ICD-10-CM | POA: Insufficient documentation

## 2019-05-06 DIAGNOSIS — I493 Ventricular premature depolarization: Secondary | ICD-10-CM | POA: Insufficient documentation

## 2019-05-06 DIAGNOSIS — I491 Atrial premature depolarization: Secondary | ICD-10-CM | POA: Insufficient documentation

## 2019-05-07 LAB — PAIN MGMT, PROFILE 6 CONF W/O MM, U
6 Acetylmorphine: NEGATIVE ng/mL
Alcohol Metabolites: NEGATIVE ng/mL (ref <500)
Amphetamines: NEGATIVE ng/mL
Barbiturates: NEGATIVE ng/mL
Benzodiazepines: NEGATIVE ng/mL
Cocaine Metabolite: NEGATIVE ng/mL
Codeine: NEGATIVE ng/mL
Creatinine: 105.6 mg/dL
Hydrocodone: NEGATIVE ng/mL
Hydromorphone: NEGATIVE ng/mL
Marijuana Metabolite: NEGATIVE ng/mL
Methadone Metabolite: NEGATIVE ng/mL
Morphine: NEGATIVE ng/mL
Norhydrocodone: NEGATIVE ng/mL
Noroxycodone: 4793 ng/mL
Opiates: NEGATIVE ng/mL
Oxidant: NEGATIVE ug/mL
Oxycodone: 948 ng/mL
Oxycodone: POSITIVE ng/mL
Oxymorphone: 916 ng/mL
Phencyclidine: NEGATIVE ng/mL
pH: 7.2 (ref 4.5–9.0)

## 2019-05-08 NOTE — Progress Notes (Signed)
Per protocol. As suspected.

## 2019-05-19 DIAGNOSIS — F17210 Nicotine dependence, cigarettes, uncomplicated: Secondary | ICD-10-CM | POA: Diagnosis not present

## 2019-05-19 DIAGNOSIS — J441 Chronic obstructive pulmonary disease with (acute) exacerbation: Secondary | ICD-10-CM | POA: Diagnosis not present

## 2019-05-19 DIAGNOSIS — R05 Cough: Secondary | ICD-10-CM | POA: Diagnosis not present

## 2019-05-19 DIAGNOSIS — J3089 Other allergic rhinitis: Secondary | ICD-10-CM | POA: Diagnosis not present

## 2019-06-11 DIAGNOSIS — F17210 Nicotine dependence, cigarettes, uncomplicated: Secondary | ICD-10-CM | POA: Diagnosis not present

## 2019-07-23 NOTE — Progress Notes (Signed)
Referring-Sue Breeback PA-C Reason for referral-palpitations  HPI: 64 year old female for evaluation of palpitations at request of Iran Planas PAC.  Cardiac CTA July 2011 showed no coronary disease and calcium score of 0.  Patient recently seen and noted to have an irregular heartbeat on exam.  Electrocardiogram showed PAC and PVC and septal infarct cannot be excluded.  Cardiology now asked to evaluate.  She has occasional palpitations described as a skip and pound.  She has dyspnea with more vigorous activities but not with routine activities.  No orthopnea, PND, pedal edema, exertional chest pain or syncope.  Cardiology now asked to evaluate.  Current Outpatient Medications  Medication Sig Dispense Refill  . albuterol (PROVENTIL HFA;VENTOLIN HFA) 108 (90 Base) MCG/ACT inhaler Inhale 2 puffs into the lungs every 6 (six) hours as needed for wheezing. 1 Inhaler 2  . alendronate (FOSAMAX) 70 MG tablet TAKE ONE TABLET BY MOUTH ONCE A WEEK, TAKE WITH A FULL GLASS OF WATER ON AN EMPTY STOMACH 12 tablet 3  . AMBULATORY NON FORMULARY MEDICATION One nebulizer machine for COPD management. 1 Device 0  . atorvastatin (LIPITOR) 20 MG tablet Take 1 tablet by mouth once daily 90 tablet 3  . cyclobenzaprine (FLEXERIL) 5 MG tablet Take 1 tablet (5 mg total) by mouth 3 (three) times daily as needed for muscle spasms. 30 tablet 1  . losartan (COZAAR) 50 MG tablet Take 1 tablet (50 mg total) by mouth daily. 90 tablet 1  . montelukast (SINGULAIR) 10 MG tablet Take 1 tablet (10 mg total) by mouth at bedtime. 90 tablet 3  . oxyCODONE-acetaminophen (PERCOCET) 10-325 MG tablet Take 1 tablet by mouth every 6 (six) hours as needed for pain. 120 tablet 0  . oxyCODONE-acetaminophen (PERCOCET) 10-325 MG tablet Take 1 tablet by mouth every 6 (six) hours as needed for pain. 120 tablet 0  . oxyCODONE-acetaminophen (PERCOCET) 10-325 MG tablet Take 1 tablet by mouth every 6 (six) hours as needed for pain. 120 tablet 0  .  predniSONE (DELTASONE) 10 MG tablet Take 10 mg by mouth daily.    . TRELEGY ELLIPTA 100-62.5-25 MCG/INH AEPB INHALE 1 PUFF ONCE DAILY 180 each 3  . ipratropium-albuterol (DUONEB) 0.5-2.5 (3) MG/3ML SOLN Take 3 mLs by nebulization every 4 (four) hours as needed. 360 mL 0   No current facility-administered medications for this visit.    Allergies  Allergen Reactions  . Ampicillin Hives  . Erythromycin Anaphylaxis  . Keflex [Cephalexin] Hives  . Penicillins Hives  . Sulfa Antibiotics Hives  . Doxycycline Swelling  . Tetracyclines & Related     Esophageal swelling  . Chantix [Varenicline]     Nausea/very sick  . Hctz [Hydrochlorothiazide] Rash     Past Medical History:  Diagnosis Date  . Cervical dysplasia   . COPD (chronic obstructive pulmonary disease) ([place])   . Hyperlipidemia   . Hypertension   . Osteoporosis     Past Surgical History:  Procedure Laterality Date  . ABDOMINAL HYSTERECTOMY    . APPENDECTOMY    . TOTAL ABDOMINAL HYSTERECTOMY W/ BILATERAL SALPINGOOPHORECTOMY      Social History   Socioeconomic History  . Marital status: Widowed    Spouse name: Not on file  . Number of children: 2  . Years of education: Not on file  . Highest education level: Not on file  Occupational History    Comment: Retired  Tobacco Use  . Smoking status: Current Every Day Smoker    Packs/day: 0.50  Types: Cigarettes  . Smokeless tobacco: Never Used  Vaping Use  . Vaping Use: Never used  Substance and Sexual Activity  . Alcohol use: Never  . Drug use: No  . Sexual activity: Not on file  Other Topics Concern  . Not on file  Social History Narrative  . Not on file   Social Determinants of Health   Financial Resource Strain:   . Difficulty of Paying Living Expenses:   Food Insecurity:   . Worried About Charity fundraiser in the Last Year:   . Arboriculturist in the Last Year:   Transportation Needs:   . Film/video editor (Medical):   Marland Kitchen Lack of  Transportation (Non-Medical):   Physical Activity:   . Days of Exercise per Week:   . Minutes of Exercise per Session:   Stress:   . Feeling of Stress :   Social Connections:   . Frequency of Communication with Friends and Family:   . Frequency of Social Gatherings with Friends and Family:   . Attends Religious Services:   . Active Member of Clubs or Organizations:   . Attends Archivist Meetings:   Marland Kitchen Marital Status:   Intimate Partner Violence:   . Fear of Current or Ex-Partner:   . Emotionally Abused:   Marland Kitchen Physically Abused:   . Sexually Abused:     Family History  Problem Relation Age of Onset  . Cancer Mother   . Heart failure Father   . CAD Father     ROS: no fevers or chills, productive cough, hemoptysis, dysphasia, odynophagia, melena, hematochezia, dysuria, hematuria, rash, seizure activity, orthopnea, PND, pedal edema, claudication. Remaining systems are negative.  Physical Exam:   Blood pressure 138/75, pulse 76, height 5\' 7"  (1.702 m), weight 138 lb (62.6 kg), SpO2 96 %.  General:  Well developed/well nourished in NAD Skin warm/dry Patient not depressed No peripheral clubbing Back-normal HEENT-normal/normal eyelids Neck supple/normal carotid upstroke bilaterally; no bruits; no JVD; no thyromegaly chest -diminished breath sounds throughout CV - RRR/normal S1 and S2; no murmurs, rubs or gallops;  PMI nondisplaced Abdomen -NT/ND, no HSM, no mass, + bowel sounds, no bruit 2+ femoral pulses, no bruits Ext-no edema, chords, 2+ DP Neuro-grossly nonfocal  ECG -May 05, 2019-sinus rhythm, PAC, PVC, cannot rule out septal infarct.  Personally reviewed  Today's electrocardiogram shows sinus rhythm with occasional PAC, no ST changes.  Personally reviewed.  A/P  1 abnormal electrocardiogram-previous ECG-cannot rule out prior septal infarct, PACs and PVCs.  We will schedule an echocardiogram to assess LV function and wall motion.  If normal no further  cardiac evaluation indicated.  2 palpitations-this is related to PACs and PVCs.  We can consider addition of a beta-blocker in the future if needed but at present these are not particular bothersome.  Echocardiogram to assess LV function.  Some of PACs and PVCs likely related to COPD.  3 hypertension-blood pressure controlled.  Continue present medications.  4 dyspnea-likely secondary to COPD.  5 tobacco abuse-patient counseled on discontinuing.  Kirk Ruths, MD

## 2019-07-26 DIAGNOSIS — F17210 Nicotine dependence, cigarettes, uncomplicated: Secondary | ICD-10-CM | POA: Diagnosis not present

## 2019-07-26 DIAGNOSIS — J441 Chronic obstructive pulmonary disease with (acute) exacerbation: Secondary | ICD-10-CM | POA: Diagnosis not present

## 2019-07-26 DIAGNOSIS — J3089 Other allergic rhinitis: Secondary | ICD-10-CM | POA: Diagnosis not present

## 2019-07-26 DIAGNOSIS — R05 Cough: Secondary | ICD-10-CM | POA: Diagnosis not present

## 2019-07-28 ENCOUNTER — Other Ambulatory Visit: Payer: Self-pay

## 2019-07-28 ENCOUNTER — Encounter: Payer: Self-pay | Admitting: Cardiology

## 2019-07-28 ENCOUNTER — Ambulatory Visit (INDEPENDENT_AMBULATORY_CARE_PROVIDER_SITE_OTHER): Payer: BC Managed Care – PPO | Admitting: Cardiology

## 2019-07-28 VITALS — BP 138/75 | HR 76 | Ht 67.0 in | Wt 138.0 lb

## 2019-07-28 DIAGNOSIS — R002 Palpitations: Secondary | ICD-10-CM

## 2019-07-28 DIAGNOSIS — I10 Essential (primary) hypertension: Secondary | ICD-10-CM | POA: Diagnosis not present

## 2019-07-28 DIAGNOSIS — I493 Ventricular premature depolarization: Secondary | ICD-10-CM

## 2019-07-28 DIAGNOSIS — I491 Atrial premature depolarization: Secondary | ICD-10-CM

## 2019-07-28 NOTE — Patient Instructions (Signed)
Medication Instructions:  NO CHANGE *If you need a refill on your cardiac medications before your next appointment, please call your pharmacy*   Lab Work: If you have labs (blood work) drawn today and your tests are completely normal, you will receive your results only by: Marland Kitchen MyChart Message (if you have MyChart) OR . A paper copy in the mail If you have any lab test that is abnormal or we need to change your treatment, we will call you to review the results.   Testing/Procedures: Your physician has requested that you have an echocardiogram. Echocardiography is a painless test that uses sound waves to create images of your heart. It provides your doctor with information about the size and shape of your heart and how well your heart's chambers and valves are working. This procedure takes approximately one hour. There are no restrictions for this procedure.[place]   Follow-Up: At [facility], you and your health needs are our priority.  As part of our continuing mission to provide you with exceptional heart care, we have created designated Provider Care Teams.  These Care Teams include your primary Cardiologist (physician) and Advanced Practice Providers (APPs -  Physician Assistants and Nurse Practitioners) who all work together to provide you with the care you need, when you need it.  We recommend signing up for the patient portal called "MyChart".  Sign up information is provided on this After Visit Summary.  MyChart is used to connect with patients for Virtual Visits (Telemedicine).  Patients are able to view lab/test results, encounter notes, upcoming appointments, etc.  Non-urgent messages can be sent to your provider as well.   To learn more about what you can do with MyChart, go to [email or web address].    Your next appointment:    AS NEEDED

## 2019-08-04 ENCOUNTER — Ambulatory Visit (INDEPENDENT_AMBULATORY_CARE_PROVIDER_SITE_OTHER): Payer: BC Managed Care – PPO | Admitting: Physician Assistant

## 2019-08-04 ENCOUNTER — Encounter: Payer: Self-pay | Admitting: Physician Assistant

## 2019-08-04 ENCOUNTER — Other Ambulatory Visit: Payer: Self-pay | Admitting: Physician Assistant

## 2019-08-04 VITALS — BP 136/72 | HR 74 | Ht 67.0 in | Wt 137.0 lb

## 2019-08-04 DIAGNOSIS — J449 Chronic obstructive pulmonary disease, unspecified: Secondary | ICD-10-CM

## 2019-08-04 DIAGNOSIS — G894 Chronic pain syndrome: Secondary | ICD-10-CM

## 2019-08-04 DIAGNOSIS — I491 Atrial premature depolarization: Secondary | ICD-10-CM

## 2019-08-04 DIAGNOSIS — M5136 Other intervertebral disc degeneration, lumbar region: Secondary | ICD-10-CM | POA: Diagnosis not present

## 2019-08-04 DIAGNOSIS — I493 Ventricular premature depolarization: Secondary | ICD-10-CM

## 2019-08-04 DIAGNOSIS — I10 Essential (primary) hypertension: Secondary | ICD-10-CM | POA: Diagnosis not present

## 2019-08-04 MED ORDER — OXYCODONE-ACETAMINOPHEN 10-325 MG PO TABS: 1.0000 | ORAL_TABLET | Freq: Four times a day (QID) | ORAL | 0 refills | Status: DC | PRN

## 2019-08-04 NOTE — Progress Notes (Signed)
Subjective:    Patient ID: Sue Smith, female    DOB: 20-Aug-1955, 64 y.o.   MRN: [id number]  HPI  Patient is a 64 year old female with hypertension, PAC, PVC, COPD, chronic pain who presents to the clinic for 54-month follow-up.  Patient is being managed by the pulmonology and cardiology.  She is stable at this time.  She denies any concerns.  Her pain is controlled at this dosage.  She has been controlled with this dose for a while.  She denies any side effects.  She is trying to stay active.  .. Active Ambulatory Problems    Diagnosis Date Noted   Essential hypertension 11/04/2011   Surgical menopause 11/04/2011   Vasomotor flushing 11/18/2011   Pre-diabetes 11/20/2011   Personal history of colonic polyps 03/26/2012   Osteoporosis 11/25/2012   Hyperlipidemia 11/25/2012   DDD (degenerative disc disease), lumbar 03/23/2013   Insomnia 11/25/2013   COPD (chronic obstructive pulmonary disease) with chronic bronchitis (HCC) 02/16/2015   Simple chronic bronchitis ([place]) 08/27/2016   Actinic keratosis 11/29/2016   Chronic pain syndrome 11/29/2016   Gastroesophageal reflux disease 11/29/2016   Current smoker 02/26/2017   Grief 06/30/2017   No energy 07/29/2018   Irregular heart rhythm 05/06/2019   PAC (premature atrial contraction) 05/06/2019   PVC (premature ventricular contraction) 05/06/2019   Resolved Ambulatory Problems    Diagnosis Date Noted   No Resolved Ambulatory Problems   Past Medical History:  Diagnosis Date   Cervical dysplasia    COPD (chronic obstructive pulmonary disease) ([place])    Hypertension        Review of Systems  All other systems reviewed and are negative.      Objective:   Physical Exam Vitals reviewed.  Constitutional:      Appearance: Normal appearance.  Cardiovascular:     Rate and Rhythm: Normal rate and regular rhythm.     Pulses: Normal pulses.     Heart sounds: Normal heart sounds.  Pulmonary:      Effort: Pulmonary effort is normal.     Breath sounds: Normal breath sounds.  Neurological:     General: No focal deficit present.     Mental Status: She is alert and oriented to person, place, and time.  Psychiatric:        Mood and Affect: Mood normal.           Assessment & Plan:  Marland KitchenMarland KitchenSeva was seen today for pain.  Diagnoses and all orders for this visit:  Chronic pain syndrome -     Discontinue: oxyCODONE-acetaminophen (PERCOCET) 10-325 MG tablet; Take 1 tablet by mouth every 6 (six) hours as needed for pain. -     Discontinue: oxyCODONE-acetaminophen (PERCOCET) 10-325 MG tablet; Take 1 tablet by mouth every 6 (six) hours as needed for pain. -     Discontinue: oxyCODONE-acetaminophen (PERCOCET) 10-325 MG tablet; Take 1 tablet by mouth every 6 (six) hours as needed for pain. -     oxyCODONE-acetaminophen (PERCOCET) 10-325 MG tablet; Take 1 tablet by mouth every 6 (six) hours as needed for pain. -     oxyCODONE-acetaminophen (PERCOCET) 10-325 MG tablet; Take 1 tablet by mouth every 6 (six) hours as needed for pain. -     oxyCODONE-acetaminophen (PERCOCET) 10-325 MG tablet; Take 1 tablet by mouth every 6 (six) hours as needed for pain.  DDD (degenerative disc disease), lumbar -     Discontinue: oxyCODONE-acetaminophen (PERCOCET) 10-325 MG tablet; Take 1 tablet by mouth every 6 (six) hours  as needed for pain. -     Discontinue: oxyCODONE-acetaminophen (PERCOCET) 10-325 MG tablet; Take 1 tablet by mouth every 6 (six) hours as needed for pain. -     Discontinue: oxyCODONE-acetaminophen (PERCOCET) 10-325 MG tablet; Take 1 tablet by mouth every 6 (six) hours as needed for pain. -     oxyCODONE-acetaminophen (PERCOCET) 10-325 MG tablet; Take 1 tablet by mouth every 6 (six) hours as needed for pain. -     oxyCODONE-acetaminophen (PERCOCET) 10-325 MG tablet; Take 1 tablet by mouth every 6 (six) hours as needed for pain. -     oxyCODONE-acetaminophen (PERCOCET) 10-325 MG tablet; Take 1 tablet  by mouth every 6 (six) hours as needed for pain.  Essential hypertension  PVC (premature ventricular contraction)  PAC (premature atrial contraction)  COPD (chronic obstructive pulmonary disease) with chronic bronchitis (HCC)    Pain contract UTD 04/2019 .Marland KitchenPDMP not reviewed this encounter. No concerns.  I keep getting e-prescribing error. Not sure what is going on. Sent to partner to send.  Follow up in 3 months.   COPD-managed by pulmonology on trelegy/prednisone/albuterol Continues to smoke 1 cigarette a day in the morning.   PVC/PAC-managed by cardiology. No medications up coming echo.

## 2019-08-04 NOTE — Progress Notes (Signed)
Attempted to sign orders but also receiving an E-prescribing error with the note of:   Code: 900 - Transaction rejected  Not able to connect to [facility]

## 2019-08-04 NOTE — Progress Notes (Signed)
E-prescribing error. Not sure what is going on. Could you please sign these for me for today?

## 2019-08-13 ENCOUNTER — Other Ambulatory Visit (HOSPITAL_COMMUNITY): Payer: BC Managed Care – PPO

## 2019-08-17 DIAGNOSIS — J441 Chronic obstructive pulmonary disease with (acute) exacerbation: Secondary | ICD-10-CM | POA: Diagnosis not present

## 2019-08-17 DIAGNOSIS — F17210 Nicotine dependence, cigarettes, uncomplicated: Secondary | ICD-10-CM | POA: Diagnosis not present

## 2019-08-17 DIAGNOSIS — R0602 Shortness of breath: Secondary | ICD-10-CM | POA: Diagnosis not present

## 2019-08-17 DIAGNOSIS — R05 Cough: Secondary | ICD-10-CM | POA: Diagnosis not present

## 2019-08-30 ENCOUNTER — Telehealth (HOSPITAL_COMMUNITY): Payer: Self-pay | Admitting: Cardiology

## 2019-08-30 NOTE — Telephone Encounter (Signed)
Patient called and cancelled Echocardiogram per scheduler and will call back later to reschedule. Order will be removed from the [facility] and when she calls back we can reinstate the order.

## 2019-09-01 DIAGNOSIS — J449 Chronic obstructive pulmonary disease, unspecified: Secondary | ICD-10-CM | POA: Diagnosis not present

## 2019-09-01 DIAGNOSIS — F17210 Nicotine dependence, cigarettes, uncomplicated: Secondary | ICD-10-CM | POA: Diagnosis not present

## 2019-09-01 DIAGNOSIS — J3089 Other allergic rhinitis: Secondary | ICD-10-CM | POA: Diagnosis not present

## 2019-09-01 DIAGNOSIS — I517 Cardiomegaly: Secondary | ICD-10-CM | POA: Diagnosis not present

## 2019-09-01 DIAGNOSIS — R05 Cough: Secondary | ICD-10-CM | POA: Diagnosis not present

## 2019-09-06 DIAGNOSIS — J449 Chronic obstructive pulmonary disease, unspecified: Secondary | ICD-10-CM | POA: Diagnosis not present

## 2019-09-19 ENCOUNTER — Emergency Department (INDEPENDENT_AMBULATORY_CARE_PROVIDER_SITE_OTHER): Payer: BC Managed Care – PPO

## 2019-09-19 ENCOUNTER — Emergency Department (INDEPENDENT_AMBULATORY_CARE_PROVIDER_SITE_OTHER)
Admission: EM | Admit: 2019-09-19 | Discharge: 2019-09-19 | Disposition: A | Discharge status: Home / Self Care | Payer: BC Managed Care – PPO | Source: Home / Self Care

## 2019-09-19 ENCOUNTER — Other Ambulatory Visit: Payer: Self-pay

## 2019-09-19 DIAGNOSIS — M546 Pain in thoracic spine: Secondary | ICD-10-CM | POA: Diagnosis not present

## 2019-09-19 DIAGNOSIS — M545 Low back pain, unspecified: Secondary | ICD-10-CM

## 2019-09-19 DIAGNOSIS — J449 Chronic obstructive pulmonary disease, unspecified: Secondary | ICD-10-CM | POA: Diagnosis not present

## 2019-09-19 DIAGNOSIS — R8290 Unspecified abnormal findings in urine: Secondary | ICD-10-CM

## 2019-09-19 DIAGNOSIS — J90 Pleural effusion, not elsewhere classified: Secondary | ICD-10-CM | POA: Diagnosis not present

## 2019-09-19 DIAGNOSIS — R7981 Abnormal blood-gas level: Secondary | ICD-10-CM

## 2019-09-19 DIAGNOSIS — G8929 Other chronic pain: Secondary | ICD-10-CM | POA: Diagnosis not present

## 2019-09-19 LAB — POCT URINALYSIS DIP (MANUAL ENTRY)
Bilirubin, UA: NEGATIVE
Glucose, UA: NEGATIVE mg/dL
Ketones, POC UA: NEGATIVE mg/dL
Nitrite, UA: NEGATIVE
Protein Ur, POC: NEGATIVE mg/dL
Spec Grav, UA: 1.015 (ref 1.010–1.025)
Urobilinogen, UA: 0.2 E.U./dL
pH, UA: 7 (ref 5.0–8.0)

## 2019-09-19 MED ORDER — METHYLPREDNISOLONE ACETATE 80 MG/ML IJ SUSP
80.0000 mg | Freq: Once | INTRAMUSCULAR | Status: AC
  Administered 2019-09-19: 80 mg via INTRAMUSCULAR

## 2019-09-19 NOTE — ED Triage Notes (Signed)
Mid lower Back pain x 1 week. Not sure if she reached for something the wrong way, No known injury. Percocet prn. No hx of back issues or surgeries. Heat pad, no relief.

## 2019-09-19 NOTE — Discharge Instructions (Signed)
You may continue to take your pain medication and muscle relaxer as prescribed for your back pain.  Please call Tuesday to schedule a follow up exam with Sports Medicine later this week for further evaluation and treatment of your pain.

## 2019-09-19 NOTE — ED Provider Notes (Signed)
[facility]    CSN: [id number] Arrival date & time: 09/19/19  1303      History   Chief Complaint Chief Complaint  Patient presents with  . Back Pain    HPI Sue Smith is a 64 y.o. female.   HPI Sue Smith is a 64 y.o. female presenting to UC with c/o midline lower back pain for about 1 week. Not sure if she reached for something wrong but no specific injury. Pain is aching and sore. Has taken percocet as needed.  Hx of back problems in the past. She has been on muscle relaxers in the past but does not have any at this time. Denies radiation of pain or numbness in arms or legs. No urinary symptoms. Denies fever, chills, n/v/d.   Pt found to have low O2 on RA. Denies SOB at this time, reports being dx with COPD recently.   Past Medical History:  Diagnosis Date  . Cervical dysplasia   . COPD (chronic obstructive pulmonary disease) ([place])   . Hyperlipidemia   . Hypertension   . Osteoporosis     Patient Active Problem List   Diagnosis Date Noted  . Irregular heart rhythm 05/06/2019  . PAC (premature atrial contraction) 05/06/2019  . PVC (premature ventricular contraction) 05/06/2019  . No energy 07/29/2018  . Grief 06/30/2017  . Current smoker 02/26/2017  . Actinic keratosis 11/29/2016  . Chronic pain syndrome 11/29/2016  . Gastroesophageal reflux disease 11/29/2016  . Simple chronic bronchitis ([place]) 08/27/2016  . COPD (chronic obstructive pulmonary disease) with chronic bronchitis ([place]) 02/16/2015  . Insomnia 11/25/2013  . DDD (degenerative disc disease), lumbar 03/23/2013  . Osteoporosis 11/25/2012  . Hyperlipidemia 11/25/2012  . Personal history of colonic polyps 03/26/2012  . Pre-diabetes 11/20/2011  . Vasomotor flushing 11/18/2011  . Essential hypertension 11/04/2011  . Surgical menopause 11/04/2011    Past Surgical History:  Procedure Laterality Date  . ABDOMINAL HYSTERECTOMY    . APPENDECTOMY    . TOTAL ABDOMINAL HYSTERECTOMY W/  BILATERAL SALPINGOOPHORECTOMY      OB History   No obstetric history on file.      Home Medications    Prior to Admission medications   Medication Sig Start Date End Date Taking? Authorizing Provider  albuterol (PROVENTIL HFA;VENTOLIN HFA) 108 (90 Base) MCG/ACT inhaler Inhale 2 puffs into the lungs every 6 (six) hours as needed for wheezing. 11/04/17   Breeback, Jade L, PA-C  alendronate (FOSAMAX) 70 MG tablet TAKE ONE TABLET BY MOUTH ONCE A WEEK, TAKE WITH A FULL GLASS OF WATER ON AN EMPTY STOMACH 01/13/19   Breeback, Jade L, PA-C  AMBULATORY NON FORMULARY MEDICATION One nebulizer machine for COPD management. 02/26/18   Donella Stade, PA-C  atorvastatin (LIPITOR) 20 MG tablet Take 1 tablet by mouth once daily 01/18/19   Breeback, Jade L, PA-C  cyclobenzaprine (FLEXERIL) 5 MG tablet Take 1 tablet (5 mg total) by mouth 3 (three) times daily as needed for muscle spasms. 12/01/18   Breeback, Jade L, PA-C  ipratropium-albuterol (DUONEB) 0.5-2.5 (3) MG/3ML SOLN Take 3 mLs by nebulization every 4 (four) hours as needed. 02/04/18   Breeback, Jade L, PA-C  losartan (COZAAR) 50 MG tablet Take 1 tablet (50 mg total) by mouth daily. 05/05/19   Breeback, Jade L, PA-C  montelukast (SINGULAIR) 10 MG tablet Take 1 tablet (10 mg total) by mouth at bedtime. 11/04/18   Breeback, Royetta Car, PA-C  oxyCODONE-acetaminophen (PERCOCET) 10-325 MG tablet Take 1 tablet by mouth  every 6 (six) hours as needed for pain. 08/04/19   Breeback, Royetta Car, PA-C  oxyCODONE-acetaminophen (PERCOCET) 10-325 MG tablet Take 1 tablet by mouth every 6 (six) hours as needed for pain. 09/03/19   Breeback, Royetta Car, PA-C  oxyCODONE-acetaminophen (PERCOCET) 10-325 MG tablet Take 1 tablet by mouth every 6 (six) hours as needed for pain. 10/03/19   Breeback, Jade L, PA-C  predniSONE (DELTASONE) 10 MG tablet Take 10 mg by mouth daily. 04/21/19   [provider]  Donnal Debar 100-62.5-25 MCG/INH AEPB INHALE 1 PUFF ONCE DAILY 01/18/19   Donella Stade, PA-C    Family History Family History  Problem Relation Age of Onset  . Cancer Mother   . Heart failure Father   . CAD Father     Social History Social History   Tobacco Use  . Smoking status: Current Every Day Smoker    Packs/day: 0.50    Types: Cigarettes  . Smokeless tobacco: Never Used  Vaping Use  . Vaping Use: Never used  Substance Use Topics  . Alcohol use: Never  . Drug use: No     Allergies   Ampicillin, Erythromycin, Keflex [cephalexin], Penicillins, Sulfa antibiotics, Doxycycline, Tetracyclines & related, Chantix [varenicline], and Hctz [hydrochlorothiazide]   Review of Systems Review of Systems  Constitutional: Negative for chills and fever.  Genitourinary: Negative for dysuria, flank pain, frequency and hematuria.  Musculoskeletal: Positive for back pain and myalgias.  Skin: Negative for color change and rash.     Physical Exam Triage Vital Signs ED Triage Vitals  Enc Vitals Group     BP 09/19/19 1456 115/83     Pulse Rate 09/19/19 1456 76     Resp 09/19/19 1456 18     Temp 09/19/19 1456 98.8 F (37.1 C)     Temp Source 09/19/19 1456 Oral     SpO2 09/19/19 1456 93 %     Weight --      Height --      Head Circumference --      Peak Flow --      Pain Score 09/19/19 1457 5     Pain Loc --      Pain Edu? --      Excl. in [place]? --    No data found.  Updated Vital Signs BP 115/83 (BP Location: Right Arm)   Pulse 76   Temp 98.8 F (37.1 C) (Oral)   Resp 18   SpO2 94%   Visual Acuity Right Eye Distance:   Left Eye Distance:   Bilateral Distance:    Right Eye Near:   Left Eye Near:    Bilateral Near:     Physical Exam Vitals and nursing note reviewed.  Constitutional:      General: She is not in acute distress.    Appearance: Normal appearance. She is well-developed. She is not ill-appearing, toxic-appearing or diaphoretic.  HENT:     Head: Normocephalic and atraumatic.  Cardiovascular:     Rate and Rhythm: Normal rate  and regular rhythm.  Pulmonary:     Effort: Pulmonary effort is normal. No respiratory distress.     Breath sounds: Normal breath sounds.  Abdominal:     General: There is no distension.     Palpations: Abdomen is soft.     Tenderness: There is no abdominal tenderness. There is no right CVA tenderness or left CVA tenderness.  Musculoskeletal:        General: Tenderness present. Normal range of motion.  Cervical back: Normal range of motion.     Comments: No midline spinal tenderness. Tenderness to lower lumbar muscles. Full ROM upper and lower extremities Normal gait.   Skin:    General: Skin is warm and dry.  Neurological:     Mental Status: She is alert and oriented to person, place, and time.  Psychiatric:        Behavior: Behavior normal.      UC Treatments / Results  Labs (all labs ordered are listed, but only abnormal results are displayed) Labs Reviewed  POCT URINALYSIS DIP (MANUAL ENTRY) - Abnormal; Notable for the following components:      Result Value   Blood, UA small (*)    Leukocytes, UA Trace (*)    All other components within normal limits  URINE CULTURE    EKG   Radiology DG Chest 2 View  Result Date: 09/19/2019 CLINICAL DATA:  Hypoxia, mid back pain EXAM: CHEST - 2 VIEW COMPARISON:  01/27/2018 FINDINGS: The lungs are mildly hyperinflated in keeping with changes of underlying COPD. No superimposed focal pulmonary infiltrate. No pneumothorax or pleural effusion. Cardiac size within normal limits. The pulmonary vascularity is normal. Osseous structures are age-appropriate. No acute bone abnormality. IMPRESSION: COPD. No acute cardiopulmonary findings. Electronically Signed   By: Fidela Salisbury MD   On: 09/19/2019 15:36    Procedures Procedures (including critical care time)  Medications Ordered in UC Medications  methylPREDNISolone acetate (DEPO-MEDROL) injection 80 mg (80 mg Intramuscular Given 09/19/19 1555)    Initial Impression / Assessment and  Plan / UC Course  I have reviewed the triage vital signs and the nursing notes.  Pertinent labs & imaging results that were available during my care of the patient were reviewed by me and considered in my medical decision making (see chart for details).     CXR performed due to O2 Sat of 93% on RA CXR: WNL, O2 Sat improved to 94% in UC, pt denies chest pain or SOB Low concern for PE at this time.  Hx and exam c/w exacerbation of chronic low back pain Encouraged f/u with PCP or Sports Medicine later this week Discussed symptoms that warrant emergent care in the ED. AVS given  Final Clinical Impressions(s) / UC Diagnoses   Final diagnoses:  Abnormal finding on urinalysis  Acute exacerbation of chronic low back pain  Low oxygen saturation     Discharge Instructions     You may continue to take your pain medication and muscle relaxer as prescribed for your back pain.  Please call Tuesday to schedule a follow up exam with Sports Medicine later this week for further evaluation and treatment of your pain.    ED Prescriptions    None     I have reviewed the PDMP during this encounter.   Noe Gens, [place] 09/20/19 2007

## 2019-09-21 LAB — URINE CULTURE
MICRO NUMBER:: 10916008
Result:: NO GROWTH
SPECIMEN QUALITY:: ADEQUATE

## 2019-10-04 DIAGNOSIS — Z20822 Contact with and (suspected) exposure to covid-19: Secondary | ICD-10-CM | POA: Diagnosis not present

## 2019-10-07 DIAGNOSIS — J449 Chronic obstructive pulmonary disease, unspecified: Secondary | ICD-10-CM | POA: Diagnosis not present

## 2019-10-13 DIAGNOSIS — H2513 Age-related nuclear cataract, bilateral: Secondary | ICD-10-CM | POA: Diagnosis not present

## 2019-10-13 DIAGNOSIS — Z135 Encounter for screening for eye and ear disorders: Secondary | ICD-10-CM | POA: Diagnosis not present

## 2019-10-13 DIAGNOSIS — H5203 Hypermetropia, bilateral: Secondary | ICD-10-CM | POA: Diagnosis not present

## 2019-11-04 ENCOUNTER — Encounter: Payer: Self-pay | Admitting: Physician Assistant

## 2019-11-04 DIAGNOSIS — U071 COVID-19: Secondary | ICD-10-CM | POA: Diagnosis not present

## 2019-11-05 ENCOUNTER — Telehealth (HOSPITAL_COMMUNITY): Payer: Self-pay

## 2019-11-05 ENCOUNTER — Other Ambulatory Visit: Payer: Self-pay | Admitting: Unknown Physician Specialty

## 2019-11-05 ENCOUNTER — Encounter: Payer: Self-pay | Admitting: Unknown Physician Specialty

## 2019-11-05 ENCOUNTER — Telehealth: Payer: Self-pay | Admitting: Unknown Physician Specialty

## 2019-11-05 ENCOUNTER — Ambulatory Visit (HOSPITAL_COMMUNITY): Payer: BC Managed Care – PPO

## 2019-11-05 DIAGNOSIS — J449 Chronic obstructive pulmonary disease, unspecified: Secondary | ICD-10-CM

## 2019-11-05 DIAGNOSIS — U071 COVID-19: Secondary | ICD-10-CM

## 2019-11-05 DIAGNOSIS — I10 Essential (primary) hypertension: Secondary | ICD-10-CM

## 2019-11-05 NOTE — Telephone Encounter (Signed)
I connected by phone with Sue Smith on 11/05/2019 at 2:48 PM to discuss the potential use of a new treatment for mild to moderate COVID-19 viral infection in non-hospitalized patients.  This patient is a 64 y.o. female that meets the FDA criteria for Emergency Use Authorization of COVID monoclonal antibody casirivimab/imdevimab or bamlanivimab/eteseviamb.  Has a (+) direct SARS-CoV-2 viral test result  Has mild or moderate COVID-19   Is NOT hospitalized due to COVID-19  Is within 10 days of symptom onset  Has at least one of the high risk factor(s) for progression to severe COVID-19 and/or hospitalization as defined in EUA.  Specific high risk criteria : Cardiovascular disease or hypertension and Chronic Lung Disease   I have spoken and communicated the following to the patient or parent/caregiver regarding COVID monoclonal antibody treatment:  1. FDA has authorized the emergency use for the treatment of mild to moderate COVID-19 in adults and pediatric patients with positive results of direct SARS-CoV-2 viral testing who are 78 years of age and older weighing at least 40 kg, and who are at high risk for progressing to severe COVID-19 and/or hospitalization.  2. The significant known and potential risks and benefits of COVID monoclonal antibody, and the extent to which such potential risks and benefits are unknown.  3. Information on available alternative treatments and the risks and benefits of those alternatives, including clinical trials.  4. Patients treated with COVID monoclonal antibody should continue to self-isolate and use infection control measures (e.g., wear mask, isolate, social distance, avoid sharing personal items, clean and disinfect "high touch" surfaces, and frequent handwashing) according to CDC guidelines.   5. The patient or parent/caregiver has the option to accept or refuse COVID monoclonal antibody treatment.  After reviewing this information with the  patient, the patient has agreed to receive one of the available covid 19 monoclonal antibodies and will be provided an appropriate fact sheet prior to infusion. Kathrine Haddock, NP 11/05/2019 2:48 PM  Sx onset 10/16

## 2019-11-05 NOTE — Telephone Encounter (Signed)
Note sent to infusion pool

## 2019-11-05 NOTE — Telephone Encounter (Signed)
Called to Discuss with patient about Covid symptoms and the use of the monoclonal antibody infusion for those with mild to moderate Covid symptoms and at a high risk of hospitalization.     Pt appears to qualify for this infusion due to co-morbid conditions and/or a member of an at-risk group in accordance with the FDA Emergency Use Authorization.    Pre-screen by RN and ready for APP to call. Sx onset 10/21. Test (+) 10/21 @ [facility] in [place]; Risk factor: COPD, HTN.

## 2019-11-06 ENCOUNTER — Ambulatory Visit (HOSPITAL_COMMUNITY)
Admission: RE | Admit: 2019-11-06 | Discharge: 2019-11-06 | Disposition: A | Discharge status: Home / Self Care | Payer: BC Managed Care – PPO | Source: Ambulatory Visit | Attending: Pulmonary Disease | Admitting: Pulmonary Disease

## 2019-11-06 DIAGNOSIS — I10 Essential (primary) hypertension: Secondary | ICD-10-CM | POA: Insufficient documentation

## 2019-11-06 DIAGNOSIS — U071 COVID-19: Secondary | ICD-10-CM

## 2019-11-06 DIAGNOSIS — J449 Chronic obstructive pulmonary disease, unspecified: Secondary | ICD-10-CM

## 2019-11-06 MED ORDER — EPINEPHRINE 0.3 MG/0.3ML IJ SOAJ: 0.3000 mg | Freq: Once | INTRAMUSCULAR | Status: DC | PRN

## 2019-11-06 MED ORDER — SODIUM CHLORIDE 0.9 % IV SOLN: Freq: Once | INTRAVENOUS | Status: AC

## 2019-11-06 MED ORDER — DIPHENHYDRAMINE HCL 50 MG/ML IJ SOLN: 50.0000 mg | Freq: Once | INTRAMUSCULAR | Status: DC | PRN

## 2019-11-06 MED ORDER — ALBUTEROL SULFATE HFA 108 (90 BASE) MCG/ACT IN AERS: 2.0000 | INHALATION_SPRAY | Freq: Once | RESPIRATORY_TRACT | Status: DC | PRN

## 2019-11-06 MED ORDER — FAMOTIDINE IN NACL 20-0.9 MG/50ML-% IV SOLN: 20.0000 mg | Freq: Once | INTRAVENOUS | Status: DC | PRN

## 2019-11-06 MED ORDER — SODIUM CHLORIDE 0.9 % IV SOLN: INTRAVENOUS | Status: DC | PRN

## 2019-11-06 MED ORDER — METHYLPREDNISOLONE SODIUM SUCC 125 MG IJ SOLR: 125.0000 mg | Freq: Once | INTRAMUSCULAR | Status: DC | PRN

## 2019-11-06 NOTE — Progress Notes (Signed)
  Diagnosis: COVID-19  Physician: Dr. Joya Gaskins  Procedure: Covid Infusion Clinic Med: bamlanivimab\etesevimab infusion - Provided patient with bamlanimivab\etesevimab fact sheet for patients, parents and caregivers prior to infusion.  Complications: No immediate complications noted.  Discharge: Discharged home   Tia Masker 11/06/2019

## 2019-11-06 NOTE — Discharge Instructions (Signed)

## 2019-11-08 ENCOUNTER — Telehealth (INDEPENDENT_AMBULATORY_CARE_PROVIDER_SITE_OTHER): Payer: BC Managed Care – PPO | Admitting: Physician Assistant

## 2019-11-08 ENCOUNTER — Ambulatory Visit: Payer: BC Managed Care – PPO | Admitting: Physician Assistant

## 2019-11-08 DIAGNOSIS — Z7951 Long term (current) use of inhaled steroids: Secondary | ICD-10-CM | POA: Diagnosis not present

## 2019-11-08 DIAGNOSIS — F17210 Nicotine dependence, cigarettes, uncomplicated: Secondary | ICD-10-CM | POA: Diagnosis not present

## 2019-11-08 DIAGNOSIS — Z5329 Procedure and treatment not carried out because of patient's decision for other reasons: Secondary | ICD-10-CM

## 2019-11-08 DIAGNOSIS — Z881 Allergy status to other antibiotic agents status: Secondary | ICD-10-CM | POA: Diagnosis not present

## 2019-11-08 DIAGNOSIS — J449 Chronic obstructive pulmonary disease, unspecified: Secondary | ICD-10-CM | POA: Diagnosis not present

## 2019-11-08 DIAGNOSIS — Z91199 Patient's noncompliance with other medical treatment and regimen due to unspecified reason: Secondary | ICD-10-CM

## 2019-11-08 DIAGNOSIS — J9601 Acute respiratory failure with hypoxia: Secondary | ICD-10-CM | POA: Diagnosis not present

## 2019-11-08 DIAGNOSIS — U071 COVID-19: Secondary | ICD-10-CM | POA: Diagnosis not present

## 2019-11-08 DIAGNOSIS — I4891 Unspecified atrial fibrillation: Secondary | ICD-10-CM | POA: Diagnosis not present

## 2019-11-08 DIAGNOSIS — R0902 Hypoxemia: Secondary | ICD-10-CM | POA: Diagnosis not present

## 2019-11-08 DIAGNOSIS — I10 Essential (primary) hypertension: Secondary | ICD-10-CM | POA: Diagnosis not present

## 2019-11-08 DIAGNOSIS — A4189 Other specified sepsis: Secondary | ICD-10-CM | POA: Diagnosis not present

## 2019-11-08 DIAGNOSIS — R7989 Other specified abnormal findings of blood chemistry: Secondary | ICD-10-CM | POA: Diagnosis not present

## 2019-11-08 DIAGNOSIS — N179 Acute kidney failure, unspecified: Secondary | ICD-10-CM | POA: Diagnosis not present

## 2019-11-08 DIAGNOSIS — J1282 Pneumonia due to coronavirus disease 2019: Secondary | ICD-10-CM | POA: Diagnosis not present

## 2019-11-08 DIAGNOSIS — I313 Pericardial effusion (noninflammatory): Secondary | ICD-10-CM | POA: Diagnosis not present

## 2019-11-08 DIAGNOSIS — Z7983 Long term (current) use of bisphosphonates: Secondary | ICD-10-CM | POA: Diagnosis not present

## 2019-11-08 DIAGNOSIS — J984 Other disorders of lung: Secondary | ICD-10-CM | POA: Diagnosis not present

## 2019-11-08 DIAGNOSIS — J189 Pneumonia, unspecified organism: Secondary | ICD-10-CM | POA: Diagnosis not present

## 2019-11-08 DIAGNOSIS — Z882 Allergy status to sulfonamides status: Secondary | ICD-10-CM | POA: Diagnosis not present

## 2019-11-08 DIAGNOSIS — E860 Dehydration: Secondary | ICD-10-CM | POA: Diagnosis not present

## 2019-11-08 DIAGNOSIS — J439 Emphysema, unspecified: Secondary | ICD-10-CM | POA: Diagnosis not present

## 2019-11-08 DIAGNOSIS — Z880 Allergy status to penicillin: Secondary | ICD-10-CM | POA: Diagnosis not present

## 2019-11-08 DIAGNOSIS — E785 Hyperlipidemia, unspecified: Secondary | ICD-10-CM | POA: Diagnosis not present

## 2019-11-08 NOTE — Progress Notes (Signed)
Saw [facility] and tested Covid positive 11/04/2019 From note:  Symptoms at the time: Fever <100.5 Cough Loss of taste Loss of smell  Symptoms are mild and constant. Brenna has had an exposure to [place]. Adisson denies CP, SOB, abdominal pain, rash, dizziness, and N/V/D. [place] has tried to alleviate the symptoms with acetaminophen with minimal relief.    **Can not reach patient to discuss current symptoms

## 2019-11-08 NOTE — Progress Notes (Signed)
Patient ID: Sue Smith, female   DOB: 09-16-1955, 64 y.o.   MRN: [id number]   LM patient did not answer.

## 2019-11-09 ENCOUNTER — Telehealth: Payer: BC Managed Care – PPO | Admitting: Physician Assistant

## 2019-11-10 ENCOUNTER — Other Ambulatory Visit: Payer: Self-pay | Admitting: Neurology

## 2019-11-10 DIAGNOSIS — M5136 Other intervertebral disc degeneration, lumbar region: Secondary | ICD-10-CM

## 2019-11-10 DIAGNOSIS — G894 Chronic pain syndrome: Secondary | ICD-10-CM

## 2019-11-10 MED ORDER — OXYCODONE-ACETAMINOPHEN 10-325 MG PO TABS: 1.0000 | ORAL_TABLET | Freq: Four times a day (QID) | ORAL | 0 refills | Status: DC | PRN

## 2019-11-10 NOTE — Telephone Encounter (Signed)
Patient made aware medication sent.

## 2019-11-10 NOTE — Telephone Encounter (Signed)
Patient left vm. Currently at [facility] (respiratory issues with Covid) she states doing okay, O2 in 90's without oxygen now. She is expected to be released this weekend. She is asking for RX for oxycodone for when she is released and she will follow up after recovery.  RX pended.

## 2019-11-12 ENCOUNTER — Other Ambulatory Visit: Payer: Self-pay | Admitting: Physician Assistant

## 2019-11-12 DIAGNOSIS — I10 Essential (primary) hypertension: Secondary | ICD-10-CM

## 2019-11-12 DIAGNOSIS — J441 Chronic obstructive pulmonary disease with (acute) exacerbation: Secondary | ICD-10-CM

## 2019-11-12 DIAGNOSIS — R062 Wheezing: Secondary | ICD-10-CM

## 2019-11-16 ENCOUNTER — Encounter: Payer: Self-pay | Admitting: Physician Assistant

## 2019-11-16 ENCOUNTER — Telehealth (INDEPENDENT_AMBULATORY_CARE_PROVIDER_SITE_OTHER): Payer: BC Managed Care – PPO | Admitting: Physician Assistant

## 2019-11-16 VITALS — HR 76 | Temp 98.1°F | Ht 67.0 in | Wt 137.0 lb

## 2019-11-16 DIAGNOSIS — G894 Chronic pain syndrome: Secondary | ICD-10-CM | POA: Diagnosis not present

## 2019-11-16 DIAGNOSIS — I4891 Unspecified atrial fibrillation: Secondary | ICD-10-CM | POA: Diagnosis not present

## 2019-11-16 DIAGNOSIS — R058 Other specified cough: Secondary | ICD-10-CM | POA: Insufficient documentation

## 2019-11-16 DIAGNOSIS — M5136 Other intervertebral disc degeneration, lumbar region: Secondary | ICD-10-CM

## 2019-11-16 DIAGNOSIS — U071 COVID-19: Secondary | ICD-10-CM

## 2019-11-16 MED ORDER — OXYCODONE-ACETAMINOPHEN 10-325 MG PO TABS: 1.0000 | ORAL_TABLET | Freq: Four times a day (QID) | ORAL | 0 refills | Status: DC | PRN

## 2019-11-16 NOTE — Progress Notes (Signed)
Patient ID: Sue Smith, female   DOB: 1956/01/14, 64 y.o.   MRN: [id number] .Marland KitchenVirtual Visit via Video Note  I connected with Sue Smith on 11/16/2019 at  2:00 PM EDT by a video enabled telemedicine application and verified that I am speaking with the correct person using two identifiers.  Location: Patient: home Provider: clinic   I discussed the limitations of evaluation and management by telemedicine and the availability of in person appointments. The patient expressed understanding and agreed to proceed.  History of Present Illness: Patient is a 64 year old female with COPD who comes into the clinic to follow-up after Covid infection and hospitalization.  Patient was diagnosed with Covid on 11/04/2019.  Patient received monoclonal antibodies on 11/06/2019.  Patient was admitted to the hospital on 11/08/2019.  On arrival to the hospital she was found to be in atrial fibrillation with RVR.  Patient was treated with steroids, antivirals, blood thinners and rate controlling drugs in hospital. Discharged on 11/12/2019 on decadron, Xarelto, and oxygen.   Patient is doing much better.  Her pulse ox is staying around 96%.  She is really just using oxygen first thing in the morning for an hour or so.  She is moving around the house very well.  She denies any palpitations or feeling like her heart is racing.  She has continued to stay stop smoking.  She had actually went to an acupuncturist 1 week before she got Covid and had stopped smoking.on her last day of decadron. On incruse in hospital to switch back to trelegy in a few days.   Pt does report ongoing productive cough.   Patient has appointment in November with cardiologist based on her atrial fibrillation in the hospital.  Patient has a physical next month.  She will need 1 more oxycodone refill to get her to that appointment and that we can do 3 refills. Active Ambulatory Problems    Diagnosis Date Noted  . Essential hypertension  11/04/2011  . Surgical menopause 11/04/2011  . Vasomotor flushing 11/18/2011  . Pre-diabetes 11/20/2011  . Personal history of colonic polyps 03/26/2012  . Osteoporosis 11/25/2012  . Hyperlipidemia 11/25/2012  . DDD (degenerative disc disease), lumbar 03/23/2013  . Insomnia 11/25/2013  . COPD (chronic obstructive pulmonary disease) with chronic bronchitis ([place]) 02/16/2015  . Simple chronic bronchitis ([place]) 08/27/2016  . Actinic keratosis 11/29/2016  . Chronic pain syndrome 11/29/2016  . Gastroesophageal reflux disease 11/29/2016  . Current smoker 02/26/2017  . Grief 06/30/2017  . No energy 07/29/2018  . Irregular heart rhythm 05/06/2019  . PAC (premature atrial contraction) 05/06/2019  . PVC (premature ventricular contraction) 05/06/2019  . Atrial fibrillation with RVR ([place]) 11/16/2019  . COVID-19 virus infection 11/16/2019  . Post-viral cough syndrome 11/16/2019   Resolved Ambulatory Problems    Diagnosis Date Noted  . No Resolved Ambulatory Problems   Past Medical History:  Diagnosis Date  . Cervical dysplasia   . COPD (chronic obstructive pulmonary disease) ([place])   . Hypertension    Reviewed med, allergy, problem list.     Observations/Objective: No acute distress Normal breathing.  Productive cough Normal mood and appearance.   .. Today's Vitals   11/16/19 1333  Pulse: 76  Temp: 98.1 F (36.7 C)  TempSrc: Oral  SpO2: 96%  Weight: 137 lb (62.1 kg)  Height: 5\' 7"  (1.702 m)   Body mass index is 21.46 kg/m.    Assessment and Plan: Marland KitchenMarland KitchenJen was seen today for hospitalization follow-up.  Diagnoses and all orders  for this visit:  COVID-19 virus infection  Post-viral cough syndrome  Atrial fibrillation with RVR (HCC)  Chronic pain syndrome -     oxyCODONE-acetaminophen (PERCOCET) 10-325 MG tablet; Take 1 tablet by mouth every 6 (six) hours as needed for pain.  DDD (degenerative disc disease), lumbar -     oxyCODONE-acetaminophen (PERCOCET) 10-325  MG tablet; Take 1 tablet by mouth every 6 (six) hours as needed for pain.   Patient does appear to be doing much better.  She does continue to have a productive cough.  Explained this can be the case for weeks after Covid.  Go ahead and add Mucinex into regimen twice a day to help with sputum production.  Continue to use DuoNeb nebulizer to help break apart sputum as well.  I think when she transitions back to trilogy from Incruse she will also have some improvement in her chronic bronchitis symptoms.  Congratulated on smoking cessation.  Very excited about how this will improve her symptoms.  Continue on Xarelto until you see cardiologist.  Patient is not on a beta-blocker or rate controlling drug at this time.  She denies any palpitations or abnormal heart rhythms.  Likely A. fib was due to the extra stress of the heart under a Covid infection.  Refilled oxycodone under her pain management contract until her in person visit where we will start back on her 25-month refills.   Follow Up Instructions:    I discussed the assessment and treatment plan with the patient. The patient was provided an opportunity to ask questions and all were answered. The patient agreed with the plan and demonstrated an understanding of the instructions.   The patient was advised to call back or seek an in-person evaluation if the symptoms worsen or if the condition fails to improve as anticipated.   Iran Planas, PA-C

## 2019-11-16 NOTE — Progress Notes (Signed)
Patient doing well since being discharged Only using oxygen around 5 am for about an hour o2 has been around 96 They added Xarelto to meds She has one more day of dexamethasone

## 2019-11-17 ENCOUNTER — Encounter: Payer: Self-pay | Admitting: Physician Assistant

## 2019-12-01 DIAGNOSIS — J449 Chronic obstructive pulmonary disease, unspecified: Secondary | ICD-10-CM | POA: Diagnosis not present

## 2019-12-01 DIAGNOSIS — R911 Solitary pulmonary nodule: Secondary | ICD-10-CM | POA: Diagnosis not present

## 2019-12-01 DIAGNOSIS — F17210 Nicotine dependence, cigarettes, uncomplicated: Secondary | ICD-10-CM | POA: Diagnosis not present

## 2019-12-01 DIAGNOSIS — Z8616 Personal history of COVID-19: Secondary | ICD-10-CM | POA: Diagnosis not present

## 2019-12-07 DIAGNOSIS — J449 Chronic obstructive pulmonary disease, unspecified: Secondary | ICD-10-CM | POA: Diagnosis not present

## 2019-12-10 DIAGNOSIS — I4891 Unspecified atrial fibrillation: Secondary | ICD-10-CM | POA: Diagnosis not present

## 2019-12-10 DIAGNOSIS — R0602 Shortness of breath: Secondary | ICD-10-CM | POA: Diagnosis not present

## 2019-12-15 DIAGNOSIS — I4891 Unspecified atrial fibrillation: Secondary | ICD-10-CM | POA: Diagnosis not present

## 2019-12-15 DIAGNOSIS — R0602 Shortness of breath: Secondary | ICD-10-CM | POA: Diagnosis not present

## 2019-12-17 ENCOUNTER — Encounter: Payer: Self-pay | Admitting: Physician Assistant

## 2019-12-17 ENCOUNTER — Other Ambulatory Visit: Payer: Self-pay | Admitting: Physician Assistant

## 2019-12-17 ENCOUNTER — Ambulatory Visit (INDEPENDENT_AMBULATORY_CARE_PROVIDER_SITE_OTHER): Payer: BC Managed Care – PPO | Admitting: Physician Assistant

## 2019-12-17 ENCOUNTER — Other Ambulatory Visit: Payer: Self-pay

## 2019-12-17 VITALS — BP 144/84 | HR 80 | Ht 67.0 in | Wt 133.0 lb

## 2019-12-17 DIAGNOSIS — Z1329 Encounter for screening for other suspected endocrine disorder: Secondary | ICD-10-CM

## 2019-12-17 DIAGNOSIS — M51369 Other intervertebral disc degeneration, lumbar region without mention of lumbar back pain or lower extremity pain: Secondary | ICD-10-CM

## 2019-12-17 DIAGNOSIS — R7303 Prediabetes: Secondary | ICD-10-CM | POA: Diagnosis not present

## 2019-12-17 DIAGNOSIS — J4489 Other specified chronic obstructive pulmonary disease: Secondary | ICD-10-CM

## 2019-12-17 DIAGNOSIS — Z0000 Encounter for general adult medical examination without abnormal findings: Secondary | ICD-10-CM | POA: Diagnosis not present

## 2019-12-17 DIAGNOSIS — E782 Mixed hyperlipidemia: Secondary | ICD-10-CM | POA: Diagnosis not present

## 2019-12-17 DIAGNOSIS — G894 Chronic pain syndrome: Secondary | ICD-10-CM

## 2019-12-17 DIAGNOSIS — I10 Essential (primary) hypertension: Secondary | ICD-10-CM | POA: Diagnosis not present

## 2019-12-17 DIAGNOSIS — J449 Chronic obstructive pulmonary disease, unspecified: Secondary | ICD-10-CM | POA: Diagnosis not present

## 2019-12-17 DIAGNOSIS — L570 Actinic keratosis: Secondary | ICD-10-CM

## 2019-12-17 DIAGNOSIS — Z1231 Encounter for screening mammogram for malignant neoplasm of breast: Secondary | ICD-10-CM

## 2019-12-17 DIAGNOSIS — L989 Disorder of the skin and subcutaneous tissue, unspecified: Secondary | ICD-10-CM

## 2019-12-17 DIAGNOSIS — M5136 Other intervertebral disc degeneration, lumbar region: Secondary | ICD-10-CM

## 2019-12-17 MED ORDER — OXYCODONE-ACETAMINOPHEN 10-325 MG PO TABS: 1.0000 | ORAL_TABLET | Freq: Four times a day (QID) | ORAL | 0 refills | Status: DC | PRN

## 2019-12-17 NOTE — Progress Notes (Signed)
Subjective:     Sue Smith is a 64 y.o. female and is here for a comprehensive physical exam. The patient reports no problems.  COPD- pulmonology manages. Doing well. O2 for a few hours at night only. On trelegy and prednisone low dose. Resolved symptoms from covid. Stopped smoking since Oct 18th. Doing accupuncture.   Social History   Socioeconomic History   Marital status: Widowed    Spouse name: Not on file   Number of children: 2   Years of education: Not on file   Highest education level: Not on file  Occupational History    Comment: Retired  Tobacco Use   Smoking status: Former Smoker    Packs/day: 0.50    Types: Cigarettes    Quit date: 11/02/2019    Years since quitting: 0.1   Smokeless tobacco: Never Used  Vaping Use   Vaping Use: Never used  Substance and Sexual Activity   Alcohol use: Never   Drug use: No   Sexual activity: Not on file  Other Topics Concern   Not on file  Social History Narrative   Not on file   Social Determinants of Health   Financial Resource Strain:    Difficulty of Paying Living Expenses: Not on file  Food Insecurity:    Worried About Charity fundraiser in the Last Year: Not on file   [place] in the Last Year: Not on file  Transportation Needs:    Lack of Transportation (Medical): Not on file   Lack of Transportation (Non-Medical): Not on file  Physical Activity:    Days of Exercise per Week: Not on file   Minutes of Exercise per Session: Not on file  Stress:    Feeling of Stress : Not on file  Social Connections:    Frequency of Communication with Friends and Family: Not on file   Frequency of Social Gatherings with Friends and Family: Not on file   Attends Religious Services: Not on file   Active Member of [employer] or Organizations: Not on file   Attends Archivist Meetings: Not on file   Marital Status: Not on file  Intimate Partner Violence:    Fear of Current or  Ex-Partner: Not on file   Emotionally Abused: Not on file   Physically Abused: Not on file   Sexually Abused: Not on file   Health Maintenance  Topic Date Due   HIV Screening  Never done   COVID-19 Vaccine (1) 01/02/2020 (Originally 07/26/1967)   MAMMOGRAM  01/12/2021   DEXA SCAN  01/12/2021   COLONOSCOPY  07/01/2022   TETANUS/TDAP  07/28/2028   INFLUENZA VACCINE  Completed   Hepatitis C Screening  Completed   PAP SMEAR-Modifier  Discontinued    The following portions of the patient's history were reviewed and updated as appropriate: allergies, current medications, past family history, past medical history, past social history, past surgical history and problem list.  Review of Systems A comprehensive review of systems was negative.   Objective:    BP (!) 144/84    Pulse 80    Ht 5\' 7"  (1.702 m)    Wt 133 lb (60.3 kg)    SpO2 96%    BMI 20.83 kg/m  General appearance: alert, cooperative and appears stated age Head: Normocephalic, without obvious abnormality, atraumatic Eyes: conjunctivae/corneas clear. PERRL, EOM's intact. Fundi benign. Ears: normal TM's and external ear canals both ears Nose: Nares normal. Septum midline. Mucosa normal. No drainage or  sinus tenderness. Throat: lips, mucosa, and tongue normal; teeth and gums normal Neck: no adenopathy, no carotid bruit, no JVD, supple, symmetrical, trachea midline and thyroid not enlarged, symmetric, no tenderness/mass/nodules Back: symmetric, no curvature. ROM normal. No CVA tenderness. Lungs: clear to auscultation bilaterally Heart: regular rate and rhythm, S1, S2 normal, no murmur, click, rub or gallop Abdomen: soft, non-tender; bowel sounds normal; no masses,  no organomegaly Extremities: extremities normal, atraumatic, no cyanosis or edema Pulses: 2+ and symmetric Skin: Skin color, texture, turgor normal. No rashes or lesions left skin lesion under eye scabbed over appearance about 63mm by 47mm. No surrounding  erythema.  Lymph nodes: Cervical, supraclavicular, and axillary nodes normal. Neurologic: Alert and oriented X 3, normal strength and tone. Normal symmetric reflexes. Normal coordination and gait    Assessment:    Healthy female exam.      Plan:  Marland KitchenMarland KitchenFlorita was seen today for annual exam.  Diagnoses and all orders for this visit:  Physical exam, routine -     CBC with Differential/Platelet -     COMPLETE METABOLIC PANEL WITH GFR -     Lipid Panel w/reflex Direct LDL -     TSH  Essential hypertension -     COMPLETE METABOLIC PANEL WITH GFR  Mixed hyperlipidemia -     Lipid Panel w/reflex Direct LDL  Pre-diabetes -     Hemoglobin A1c  COPD (chronic obstructive pulmonary disease) with chronic bronchitis (HCC) -     CBC with Differential/Platelet -     COMPLETE METABOLIC PANEL WITH GFR -     Lipid Panel w/reflex Direct LDL -     TSH  Thyroid disorder screen -     TSH  Chronic pain syndrome -     oxyCODONE-acetaminophen (PERCOCET) 10-325 MG tablet; Take 1 tablet by mouth every 6 (six) hours as needed for pain. -     oxyCODONE-acetaminophen (PERCOCET) 10-325 MG tablet; Take 1 tablet by mouth every 6 (six) hours as needed for pain. -     oxyCODONE-acetaminophen (PERCOCET) 10-325 MG tablet; Take 1 tablet by mouth every 6 (six) hours as needed for pain.  DDD (degenerative disc disease), lumbar -     oxyCODONE-acetaminophen (PERCOCET) 10-325 MG tablet; Take 1 tablet by mouth every 6 (six) hours as needed for pain. -     oxyCODONE-acetaminophen (PERCOCET) 10-325 MG tablet; Take 1 tablet by mouth every 6 (six) hours as needed for pain. -     oxyCODONE-acetaminophen (PERCOCET) 10-325 MG tablet; Take 1 tablet by mouth every 6 (six) hours as needed for pain.  Skin lesion -     Ambulatory referral to Dermatology  Actinic keratosis -     Ambulatory referral to Dermatology   .Marland Kitchen Discussed 150 minutes of exercise a week.  Encouraged vitamin D 1000 units and Calcium 1300mg  or 4  servings of dairy a day.  Fasting labs ordered.  Colonoscopy UTD.  Mammogram UTD.  Flu and pneumonia shot UTD.  Shingles vaccine UTD.   Skin lesion that was frozen but came back. Likely needs to be biopsied to confirm dx. Appeared initially like AK.   Pain rx post dated.  Marland KitchenMarland KitchenPDMP reviewed during this encounter. Pain contract UTD.  UDS UTD.    See After Visit Summary for Counseling Recommendations

## 2019-12-17 NOTE — Patient Instructions (Signed)
Health Maintenance After Age 62 After age 62, you are at a higher risk for certain long-term diseases and infections as well as injuries from falls. Falls are a major cause of broken bones and head injuries in people who are older than age 62. Getting regular preventive care can help to keep you healthy and well. Preventive care includes getting regular testing and making lifestyle changes as recommended by your health care provider. Talk with your health care provider about:  Which screenings and tests you should have. A screening is a test that checks for a disease when you have no symptoms.  A diet and exercise plan that is right for you. What should I know about screenings and tests to prevent falls? Screening and testing are the best ways to find a health problem early. Early diagnosis and treatment give you the best chance of managing medical conditions that are common after age 62. Certain conditions and lifestyle choices may make you more likely to have a fall. Your health care provider may recommend:  Regular vision checks. Poor vision and conditions such as cataracts can make you more likely to have a fall. If you wear glasses, make sure to get your prescription updated if your vision changes.  Medicine review. Work with your health care provider to regularly review all of the medicines you are taking, including over-the-counter medicines. Ask your health care provider about any side effects that may make you more likely to have a fall. Tell your health care provider if any medicines that you take make you feel dizzy or sleepy.  Osteoporosis screening. Osteoporosis is a condition that causes the bones to get weaker. This can make the bones weak and cause them to break more easily.  Blood pressure screening. Blood pressure changes and medicines to control blood pressure can make you feel dizzy.  Strength and balance checks. Your health care provider may recommend certain tests to check your  strength and balance while standing, walking, or changing positions.  Foot health exam. Foot pain and numbness, as well as not wearing proper footwear, can make you more likely to have a fall.  Depression screening. You may be more likely to have a fall if you have a fear of falling, feel emotionally low, or feel unable to do activities that you used to do.  Alcohol use screening. Using too much alcohol can affect your balance and may make you more likely to have a fall. What actions can I take to lower my risk of falls? General instructions  Talk with your health care provider about your risks for falling. Tell your health care provider if: ? You fall. Be sure to tell your health care provider about all falls, even ones that seem minor. ? You feel dizzy, sleepy, or off-balance.  Take over-the-counter and prescription medicines only as told by your health care provider. These include any supplements.  Eat a healthy diet and maintain a healthy weight. A healthy diet includes low-fat dairy products, low-fat (lean) meats, and fiber from whole grains, beans, and lots of fruits and vegetables. Home safety  Remove any tripping hazards, such as rugs, cords, and clutter.  Install safety equipment such as grab bars in bathrooms and safety rails on stairs.  Keep rooms and walkways well-lit. Activity   Follow a regular exercise program to stay fit. This will help you maintain your balance. Ask your health care provider what types of exercise are appropriate for you.  If you need a cane or   walker, use it as recommended by your health care provider.  Wear supportive shoes that have nonskid soles. Lifestyle  Do not drink alcohol if your health care provider tells you not to drink.  If you drink alcohol, limit how much you have: ? 0-1 drink a day for women. ? 0-2 drinks a day for men.  Be aware of how much alcohol is in your drink. In the [place] one drink equals one typical bottle of beer (12  oz), one-half glass of wine (5 oz), or one shot of hard liquor (1 oz).  Do not use any products that contain nicotine or tobacco, such as cigarettes and e-cigarettes. If you need help quitting, ask your health care provider. Summary  Having a healthy lifestyle and getting preventive care can help to protect your health and wellness after age 62.  Screening and testing are the best way to find a health problem early and help you avoid having a fall. Early diagnosis and treatment give you the best chance for managing medical conditions that are more common for people who are older than age 62.  Falls are a major cause of broken bones and head injuries in people who are older than age 62. Take precautions to prevent a fall at home.  Work with your health care provider to learn what changes you can make to improve your health and wellness and to prevent falls. This information is not intended to replace advice given to you by your health care provider. Make sure you discuss any questions you have with your health care provider. Document Revised: 04/23/2018 Document Reviewed: 11/13/2016 Elsevier Patient Education  2020 Elsevier Inc.  

## 2019-12-18 LAB — CBC WITH DIFFERENTIAL/PLATELET
Absolute Monocytes: 612 cells/uL (ref 200–950)
Basophils Absolute: 68 cells/uL (ref 0–200)
Basophils Relative: 0.5 %
Eosinophils Absolute: 54 cells/uL (ref 15–500)
Eosinophils Relative: 0.4 %
HCT: 44.9 % (ref 35.0–45.0)
Hemoglobin: 14.6 g/dL (ref 11.7–15.5)
Lymphs Abs: 993 cells/uL (ref 850–3900)
MCH: 31.9 pg (ref 27.0–33.0)
MCHC: 32.5 g/dL (ref 32.0–36.0)
MCV: 98 fL (ref 80.0–100.0)
MPV: 10.5 fL (ref 7.5–12.5)
Monocytes Relative: 4.5 %
Neutro Abs: 11873 cells/uL — ABNORMAL HIGH (ref 1500–7800)
Neutrophils Relative %: 87.3 %
Platelets: 227 10*3/uL (ref 140–400)
RBC: 4.58 10*6/uL (ref 3.80–5.10)
RDW: 12.3 % (ref 11.0–15.0)
Total Lymphocyte: 7.3 %
WBC: 13.6 10*3/uL — ABNORMAL HIGH (ref 3.8–10.8)

## 2019-12-18 LAB — COMPLETE METABOLIC PANEL WITH GFR
AG Ratio: 1.5 (calc) (ref 1.0–2.5)
ALT: 11 U/L (ref 6–29)
AST: 9 U/L — ABNORMAL LOW (ref 10–35)
Albumin: 4.1 g/dL (ref 3.6–5.1)
Alkaline phosphatase (APISO): 63 U/L (ref 37–153)
BUN: 13 mg/dL (ref 7–25)
CO2: 30 mmol/L (ref 20–32)
Calcium: 9.4 mg/dL (ref 8.6–10.4)
Chloride: 104 mmol/L (ref 98–110)
Creat: 0.82 mg/dL (ref 0.50–0.99)
GFR, Est African American: 89 mL/min/{1.73_m2} (ref >=60)
GFR, Est Non African American: 77 mL/min/{1.73_m2} (ref >=60)
Globulin: 2.8 g/dL (calc) (ref 1.9–3.7)
Glucose, Bld: 104 mg/dL — ABNORMAL HIGH (ref 65–99)
Potassium: 3.8 mmol/L (ref 3.5–5.3)
Sodium: 143 mmol/L (ref 135–146)
Total Bilirubin: 0.4 mg/dL (ref 0.2–1.2)
Total Protein: 6.9 g/dL (ref 6.1–8.1)

## 2019-12-18 LAB — HEMOGLOBIN A1C
Hgb A1c MFr Bld: 5.6 % of total Hgb (ref <5.7)
Mean Plasma Glucose: 114 (calc)
eAG (mmol/L): 6.3 (calc)

## 2019-12-18 LAB — LIPID PANEL W/REFLEX DIRECT LDL
Cholesterol: 203 mg/dL — ABNORMAL HIGH (ref <200)
HDL: 57 mg/dL (ref >=50)
LDL Cholesterol (Calc): 114 mg/dL (calc) — ABNORMAL HIGH
Non-HDL Cholesterol (Calc): 146 mg/dL (calc) — ABNORMAL HIGH (ref <130)
Total CHOL/HDL Ratio: 3.6 (calc) (ref <5.0)
Triglycerides: 207 mg/dL — ABNORMAL HIGH (ref <150)

## 2019-12-18 LAB — TSH: TSH: 0.85 mIU/L (ref 0.40–4.50)

## 2019-12-20 NOTE — Progress Notes (Signed)
Sue Smith,   Cholesterol is up from 1 year ago. TG elevated and fasting glucose. Likely due to all the prednisone. A1C is normal. No diabetes at this time. Watch sugars, carbs, processed foods. Stay active. Recheck in 6 months.

## 2019-12-27 DIAGNOSIS — C44319 Basal cell carcinoma of skin of other parts of face: Secondary | ICD-10-CM | POA: Diagnosis not present

## 2019-12-27 DIAGNOSIS — L578 Other skin changes due to chronic exposure to nonionizing radiation: Secondary | ICD-10-CM | POA: Diagnosis not present

## 2019-12-27 DIAGNOSIS — L814 Other melanin hyperpigmentation: Secondary | ICD-10-CM | POA: Diagnosis not present

## 2019-12-28 DIAGNOSIS — R0600 Dyspnea, unspecified: Secondary | ICD-10-CM | POA: Diagnosis not present

## 2019-12-28 DIAGNOSIS — J449 Chronic obstructive pulmonary disease, unspecified: Secondary | ICD-10-CM | POA: Diagnosis not present

## 2019-12-28 DIAGNOSIS — R002 Palpitations: Secondary | ICD-10-CM | POA: Diagnosis not present

## 2019-12-28 DIAGNOSIS — I480 Paroxysmal atrial fibrillation: Secondary | ICD-10-CM | POA: Diagnosis not present

## 2020-01-06 DIAGNOSIS — J449 Chronic obstructive pulmonary disease, unspecified: Secondary | ICD-10-CM | POA: Diagnosis not present

## 2020-01-10 DIAGNOSIS — C44319 Basal cell carcinoma of skin of other parts of face: Secondary | ICD-10-CM | POA: Diagnosis not present

## 2020-01-12 DIAGNOSIS — I4891 Unspecified atrial fibrillation: Secondary | ICD-10-CM | POA: Diagnosis not present

## 2020-01-12 DIAGNOSIS — J449 Chronic obstructive pulmonary disease, unspecified: Secondary | ICD-10-CM | POA: Diagnosis not present

## 2020-01-12 DIAGNOSIS — R0602 Shortness of breath: Secondary | ICD-10-CM | POA: Diagnosis not present

## 2020-01-18 DIAGNOSIS — I4891 Unspecified atrial fibrillation: Secondary | ICD-10-CM | POA: Diagnosis not present

## 2020-01-18 DIAGNOSIS — R0602 Shortness of breath: Secondary | ICD-10-CM | POA: Diagnosis not present

## 2020-01-27 ENCOUNTER — Ambulatory Visit: Payer: BC Managed Care – PPO

## 2020-01-28 ENCOUNTER — Encounter: Payer: Self-pay | Admitting: Physician Assistant

## 2020-01-28 MED ORDER — ALPRAZOLAM 0.25 MG PO TABS: 0.2500 mg | ORAL_TABLET | Freq: Two times a day (BID) | ORAL | 0 refills | Status: DC | PRN

## 2020-01-28 NOTE — Telephone Encounter (Signed)
Has been on Valium in 2017, no other meds in this category in past med list.   She is on chronic pain medication (oxycodone)  Not on mood medication

## 2020-02-04 DIAGNOSIS — I480 Paroxysmal atrial fibrillation: Secondary | ICD-10-CM | POA: Diagnosis not present

## 2020-02-04 DIAGNOSIS — R002 Palpitations: Secondary | ICD-10-CM | POA: Diagnosis not present

## 2020-02-04 DIAGNOSIS — J449 Chronic obstructive pulmonary disease, unspecified: Secondary | ICD-10-CM | POA: Diagnosis not present

## 2020-02-04 DIAGNOSIS — R0683 Snoring: Secondary | ICD-10-CM | POA: Diagnosis not present

## 2020-02-06 DIAGNOSIS — J449 Chronic obstructive pulmonary disease, unspecified: Secondary | ICD-10-CM | POA: Diagnosis not present

## 2020-02-14 ENCOUNTER — Ambulatory Visit: Payer: BC Managed Care – PPO | Admitting: Physician Assistant

## 2020-02-14 ENCOUNTER — Encounter: Payer: Self-pay | Admitting: Physician Assistant

## 2020-02-14 ENCOUNTER — Other Ambulatory Visit: Payer: Self-pay

## 2020-02-14 VITALS — BP 168/77 | HR 78 | Ht 67.0 in | Wt 132.0 lb

## 2020-02-14 DIAGNOSIS — G894 Chronic pain syndrome: Secondary | ICD-10-CM

## 2020-02-14 DIAGNOSIS — M5136 Other intervertebral disc degeneration, lumbar region: Secondary | ICD-10-CM

## 2020-02-14 DIAGNOSIS — F410 Panic disorder [episodic paroxysmal anxiety] without agoraphobia: Secondary | ICD-10-CM | POA: Diagnosis not present

## 2020-02-14 DIAGNOSIS — I4891 Unspecified atrial fibrillation: Secondary | ICD-10-CM | POA: Diagnosis not present

## 2020-02-14 DIAGNOSIS — I10 Essential (primary) hypertension: Secondary | ICD-10-CM

## 2020-02-14 DIAGNOSIS — Z7185 Encounter for immunization safety counseling: Secondary | ICD-10-CM | POA: Diagnosis not present

## 2020-02-14 MED ORDER — OXYCODONE-ACETAMINOPHEN 10-325 MG PO TABS
1.0000 | ORAL_TABLET | Freq: Four times a day (QID) | ORAL | 0 refills | Status: DC | PRN
Start: 2020-04-03 — End: 2020-07-10

## 2020-02-14 MED ORDER — OXYCODONE-ACETAMINOPHEN 10-325 MG PO TABS: 1.0000 | ORAL_TABLET | Freq: Four times a day (QID) | ORAL | 0 refills | Status: DC | PRN

## 2020-02-14 NOTE — Progress Notes (Addendum)
Established Patient Office Visit  Subjective:  Patient ID: Sue Smith, female    DOB: 08-31-1955  Age: 65 y.o. MRN: [id number]  CC: No chief complaint on file.   HPI [name] presents for follow up for A-fib and panic attacks.  She is scheduled to have PVI ablation performed on 2/10.  Additionally, patient experienced a panic attack on 01/28/20 and was prescribed Xanax to use as needed.  Patient reports that she has not had any further panic attacks other than the one event, and hasn't needed to use any of her prescription Xanax.  She believes that it was brought on by being overwhelmed about her chronic health conditions, including her Afib, COPD, and Osteoarthritis.  She reports that her Osteoarthritis is reasonably controlled on her current pain regimen.  She is currently having a 1 day history of worsening of her COPD sx, for which she has been increasing her use of rescue inhaler.  She denies fever, diaphoresis, fatigue, weight loss, lightheadedness, syncope.    Past Medical History:  Diagnosis Date  . Cervical dysplasia   . COPD (chronic obstructive pulmonary disease) ([place])   . Hyperlipidemia   . Hypertension   . Osteoporosis     Past Surgical History:  Procedure Laterality Date  . ABDOMINAL HYSTERECTOMY    . APPENDECTOMY    . TOTAL ABDOMINAL HYSTERECTOMY W/ BILATERAL SALPINGOOPHORECTOMY      Family History  Problem Relation Age of Onset  . Cancer Mother   . Heart failure Father   . CAD Father     Social History   Socioeconomic History  . Marital status: Widowed    Spouse name: Not on file  . Number of children: 2  . Years of education: Not on file  . Highest education level: Not on file  Occupational History    Comment: Retired  Tobacco Use  . Smoking status: Former Smoker    Packs/day: 0.50    Types: Cigarettes    Quit date: 11/02/2019    Years since quitting: 0.2  . Smokeless tobacco: Never Used  Vaping Use  . Vaping Use: Never used   Substance and Sexual Activity  . Alcohol use: Never  . Drug use: No  . Sexual activity: Not on file  Other Topics Concern  . Not on file  Social History Narrative  . Not on file   Social Determinants of Health   Financial Resource Strain: Not on file  Food Insecurity: Not on file  Transportation Needs: Not on file  Physical Activity: Not on file  Stress: Not on file  Social Connections: Not on file  Intimate Partner Violence: Not on file    Outpatient Medications Prior to Visit  Medication Sig Dispense Refill  . albuterol (PROVENTIL HFA;VENTOLIN HFA) 108 (90 Base) MCG/ACT inhaler Inhale 2 puffs into the lungs every 6 (six) hours as needed for wheezing. 1 Inhaler 2  . alendronate (FOSAMAX) 70 MG tablet TAKE ONE TABLET BY MOUTH ONCE A WEEK, TAKE WITH A FULL GLASS OF WATER ON AN EMPTY STOMACH 12 tablet 3  . ALPRAZolam (XANAX) 0.25 MG tablet Take 1 tablet (0.25 mg total) by mouth 2 (two) times daily as needed for anxiety. 20 tablet 0  . AMBULATORY NON FORMULARY MEDICATION One nebulizer machine for COPD management. 1 Device 0  . atorvastatin (LIPITOR) 20 MG tablet Take 10 mg by mouth daily.    Marland Kitchen diltiazem (CARDIZEM CD) 180 MG 24 hr capsule Take 180 mg by mouth daily.    Marland Kitchen  ipratropium-albuterol (DUONEB) 0.5-2.5 (3) MG/3ML SOLN Take 3 mLs by nebulization every 4 (four) hours as needed. 360 mL 0  . losartan (COZAAR) 50 MG tablet Take 1 tablet by mouth once daily 90 tablet 1  . montelukast (SINGULAIR) 10 MG tablet TAKE 1 TABLET BY MOUTH AT BEDTIME 90 tablet 3  . predniSONE (DELTASONE) 10 MG tablet Take 10 mg by mouth daily.    . TRELEGY ELLIPTA 100-62.5-25 MCG/INH AEPB INHALE 1 PUFF ONCE DAILY 180 each 3  . XARELTO 15 MG TABS tablet Take 15 mg by mouth daily.    Marland Kitchen oxyCODONE-acetaminophen (PERCOCET) 10-325 MG tablet Take 1 tablet by mouth every 6 (six) hours as needed for pain. 120 tablet 0  . oxyCODONE-acetaminophen (PERCOCET) 10-325 MG tablet Take 1 tablet by mouth every 6 (six) hours as  needed for pain. 120 tablet 0  . [START ON 03/06/2020] oxyCODONE-acetaminophen (PERCOCET) 10-325 MG tablet Take 1 tablet by mouth every 6 (six) hours as needed for pain. 120 tablet 0  . atorvastatin (LIPITOR) 20 MG tablet Take 1 tablet by mouth once daily 90 tablet 3  . dexamethasone (DECADRON) 6 MG tablet Take 6 mg by mouth at bedtime.     No facility-administered medications prior to visit.    Allergies  Allergen Reactions  . Ampicillin Hives  . Erythromycin Anaphylaxis  . Keflex [Cephalexin] Hives  . Penicillins Hives  . Sulfa Antibiotics Hives  . Doxycycline Swelling  . Tetracyclines & Related     Esophageal swelling  . Chantix [Varenicline]     Nausea/very sick  . Hctz [Hydrochlorothiazide] Rash    ROS Review of Systems  Constitutional: Negative for chills, fatigue, fever and unexpected weight change.  HENT: Negative for congestion, postnasal drip and sinus pain.   Eyes: Negative for redness and itching.  Respiratory: Positive for cough, chest tightness and shortness of breath.   Cardiovascular: Positive for palpitations. Negative for chest pain and leg swelling.  Gastrointestinal: Negative for abdominal pain, constipation, diarrhea, nausea and vomiting.  Endocrine: Negative for polydipsia and polyuria.  Genitourinary: Negative for dysuria, frequency and urgency.  Musculoskeletal: Positive for arthralgias and back pain. Negative for myalgias.  Neurological: Negative for dizziness, syncope, weakness, light-headedness and headaches.  Psychiatric/Behavioral: Negative for agitation, confusion and dysphoric mood. The patient is nervous/anxious.       Objective:    Physical Exam Constitutional:      General: She is not in acute distress.    Appearance: Normal appearance.  HENT:     Head: Normocephalic.  Eyes:     Extraocular Movements: Extraocular movements intact.     Pupils: Pupils are equal, round, and reactive to light.  Cardiovascular:     Rate and Rhythm: Normal  rate and regular rhythm.     Pulses: Normal pulses.     Heart sounds: Normal heart sounds. No murmur heard. No friction rub. No gallop.   Pulmonary:     Effort: Pulmonary effort is normal.     Breath sounds: Normal breath sounds. No wheezing, rhonchi or rales.  Abdominal:     General: Abdomen is flat. There is no distension.  Musculoskeletal:        General: No swelling or signs of injury. Normal range of motion.     Cervical back: Normal range of motion.  Skin:    General: Skin is warm and dry.     Capillary Refill: Capillary refill takes less than 2 seconds.  Neurological:     General: No focal deficit present.  Mental Status: She is alert and oriented to person, place, and time. Mental status is at baseline.  Psychiatric:        Mood and Affect: Mood normal.        Behavior: Behavior normal.     BP (!) 168/77   Pulse 78   Ht 5\' 7"  (1.702 m)   Wt 132 lb (59.9 kg)   SpO2 95%   BMI 20.67 kg/m  Wt Readings from Last 3 Encounters:  02/14/20 132 lb (59.9 kg)  12/17/19 133 lb (60.3 kg)  11/16/19 137 lb (62.1 kg)     There are no preventive care reminders to display for this patient.  There are no preventive care reminders to display for this patient.  Lab Results  Component Value Date   TSH 0.85 12/17/2019   Lab Results  Component Value Date   WBC 13.6 (H) 12/17/2019   HGB 14.6 12/17/2019   HCT 44.9 12/17/2019   MCV 98.0 12/17/2019   PLT 227 12/17/2019   Lab Results  Component Value Date   NA 143 12/17/2019   K 3.8 12/17/2019   CO2 30 12/17/2019   GLUCOSE 104 (H) 12/17/2019   BUN 13 12/17/2019   CREATININE 0.82 12/17/2019   BILITOT 0.4 12/17/2019   ALKPHOS 59 12/01/2015   AST 9 (L) 12/17/2019   ALT 11 12/17/2019   PROT 6.9 12/17/2019   ALBUMIN 4.6 12/01/2015   CALCIUM 9.4 12/17/2019   Lab Results  Component Value Date   CHOL 203 (H) 12/17/2019   Lab Results  Component Value Date   HDL 57 12/17/2019   Lab Results  Component Value Date    LDLCALC 114 (H) 12/17/2019   Lab Results  Component Value Date   TRIG 207 (H) 12/17/2019   Lab Results  Component Value Date   CHOLHDL 3.6 12/17/2019   Lab Results  Component Value Date   HGBA1C 5.6 12/17/2019      Assessment & Plan:  Marland KitchenMarland KitchenDiagnoses and all orders for this visit:  Atrial fibrillation with RVR ([place])  Vaccine counseling  Panic attack  Chronic pain syndrome -     oxyCODONE-acetaminophen (PERCOCET) 10-325 MG tablet; Take 1 tablet by mouth every 6 (six) hours as needed for pain. -     oxyCODONE-acetaminophen (PERCOCET) 10-325 MG tablet; Take 1 tablet by mouth every 6 (six) hours as needed for pain. -     oxyCODONE-acetaminophen (PERCOCET) 10-325 MG tablet; Take 1 tablet by mouth every 6 (six) hours as needed for pain.  DDD (degenerative disc disease), lumbar -     oxyCODONE-acetaminophen (PERCOCET) 10-325 MG tablet; Take 1 tablet by mouth every 6 (six) hours as needed for pain. -     oxyCODONE-acetaminophen (PERCOCET) 10-325 MG tablet; Take 1 tablet by mouth every 6 (six) hours as needed for pain. -     oxyCODONE-acetaminophen (PERCOCET) 10-325 MG tablet; Take 1 tablet by mouth every 6 (six) hours as needed for pain.  Essential hypertension    PAF:  Continue with plan for ablation on 2/10 and follow up with Cardio.  COPD:  Patient to continue with current treatment plan and follow up if acute worsening of symptoms.   Vaccine Counseling:  Considering patient's COPD and previous COVID19 infection, patient educated about benefits and risk of receiving a COVID19 vaccination. It has been 3 months since monoclonal antibodies.   Panic Attacks:  Patient encouraged to use Xanax if needed, but to not take narcotics and Xanax at the same time.if anxiety is  daily and worsening need to consider SSRI. Discussed meditation, breathing, exercise to manage anxiety.   Chronic Pain Management:  Pain contract up to date, medications refilled.   Follow up in 3 months.  ..PDMP  reviewed during this encounter.   Marland KitchenMarland KitchenPDMP reviewed during this encounter.     Follow-up: Return in about 3 months (around 05/13/2020).    Marland KitchenVernetta Honey PA-C, have reviewed and agree with the above documentation in it's entirety.

## 2020-02-22 DIAGNOSIS — I480 Paroxysmal atrial fibrillation: Secondary | ICD-10-CM | POA: Diagnosis not present

## 2020-02-22 DIAGNOSIS — R911 Solitary pulmonary nodule: Secondary | ICD-10-CM | POA: Diagnosis not present

## 2020-02-22 DIAGNOSIS — J439 Emphysema, unspecified: Secondary | ICD-10-CM | POA: Diagnosis not present

## 2020-02-24 DIAGNOSIS — J449 Chronic obstructive pulmonary disease, unspecified: Secondary | ICD-10-CM | POA: Diagnosis not present

## 2020-02-24 DIAGNOSIS — I4891 Unspecified atrial fibrillation: Secondary | ICD-10-CM | POA: Diagnosis not present

## 2020-02-24 DIAGNOSIS — Z79899 Other long term (current) drug therapy: Secondary | ICD-10-CM | POA: Diagnosis not present

## 2020-02-24 DIAGNOSIS — K219 Gastro-esophageal reflux disease without esophagitis: Secondary | ICD-10-CM | POA: Diagnosis not present

## 2020-02-24 DIAGNOSIS — R0683 Snoring: Secondary | ICD-10-CM | POA: Diagnosis not present

## 2020-02-24 DIAGNOSIS — E785 Hyperlipidemia, unspecified: Secondary | ICD-10-CM | POA: Diagnosis not present

## 2020-02-24 DIAGNOSIS — Z7983 Long term (current) use of bisphosphonates: Secondary | ICD-10-CM | POA: Diagnosis not present

## 2020-02-24 DIAGNOSIS — Z8616 Personal history of COVID-19: Secondary | ICD-10-CM | POA: Diagnosis not present

## 2020-02-24 DIAGNOSIS — M810 Age-related osteoporosis without current pathological fracture: Secondary | ICD-10-CM | POA: Diagnosis not present

## 2020-02-24 DIAGNOSIS — R002 Palpitations: Secondary | ICD-10-CM | POA: Diagnosis not present

## 2020-02-24 DIAGNOSIS — Z7901 Long term (current) use of anticoagulants: Secondary | ICD-10-CM | POA: Diagnosis not present

## 2020-02-24 DIAGNOSIS — I480 Paroxysmal atrial fibrillation: Secondary | ICD-10-CM | POA: Diagnosis not present

## 2020-02-24 DIAGNOSIS — I517 Cardiomegaly: Secondary | ICD-10-CM | POA: Diagnosis not present

## 2020-02-24 DIAGNOSIS — I10 Essential (primary) hypertension: Secondary | ICD-10-CM | POA: Diagnosis not present

## 2020-02-24 DIAGNOSIS — Z7951 Long term (current) use of inhaled steroids: Secondary | ICD-10-CM | POA: Diagnosis not present

## 2020-02-25 DIAGNOSIS — R0602 Shortness of breath: Secondary | ICD-10-CM | POA: Diagnosis not present

## 2020-02-25 DIAGNOSIS — I4891 Unspecified atrial fibrillation: Secondary | ICD-10-CM | POA: Diagnosis not present

## 2020-02-25 DIAGNOSIS — I480 Paroxysmal atrial fibrillation: Secondary | ICD-10-CM | POA: Diagnosis not present

## 2020-02-25 DIAGNOSIS — J449 Chronic obstructive pulmonary disease, unspecified: Secondary | ICD-10-CM | POA: Diagnosis not present

## 2020-03-02 DIAGNOSIS — J439 Emphysema, unspecified: Secondary | ICD-10-CM | POA: Diagnosis not present

## 2020-03-02 DIAGNOSIS — R911 Solitary pulmonary nodule: Secondary | ICD-10-CM | POA: Diagnosis not present

## 2020-03-02 DIAGNOSIS — I313 Pericardial effusion (noninflammatory): Secondary | ICD-10-CM | POA: Diagnosis not present

## 2020-03-02 DIAGNOSIS — J189 Pneumonia, unspecified organism: Secondary | ICD-10-CM | POA: Diagnosis not present

## 2020-03-02 DIAGNOSIS — J449 Chronic obstructive pulmonary disease, unspecified: Secondary | ICD-10-CM | POA: Diagnosis not present

## 2020-03-07 DIAGNOSIS — J441 Chronic obstructive pulmonary disease with (acute) exacerbation: Secondary | ICD-10-CM | POA: Diagnosis not present

## 2020-03-07 DIAGNOSIS — R911 Solitary pulmonary nodule: Secondary | ICD-10-CM | POA: Diagnosis not present

## 2020-03-07 DIAGNOSIS — R0602 Shortness of breath: Secondary | ICD-10-CM | POA: Diagnosis not present

## 2020-03-07 DIAGNOSIS — F17210 Nicotine dependence, cigarettes, uncomplicated: Secondary | ICD-10-CM | POA: Diagnosis not present

## 2020-03-08 DIAGNOSIS — J449 Chronic obstructive pulmonary disease, unspecified: Secondary | ICD-10-CM | POA: Diagnosis not present

## 2020-03-14 DIAGNOSIS — R911 Solitary pulmonary nodule: Secondary | ICD-10-CM | POA: Diagnosis not present

## 2020-03-20 DIAGNOSIS — Z9889 Other specified postprocedural states: Secondary | ICD-10-CM | POA: Diagnosis not present

## 2020-03-20 DIAGNOSIS — I480 Paroxysmal atrial fibrillation: Secondary | ICD-10-CM | POA: Diagnosis not present

## 2020-03-20 DIAGNOSIS — Z8679 Personal history of other diseases of the circulatory system: Secondary | ICD-10-CM | POA: Diagnosis not present

## 2020-04-05 DIAGNOSIS — J449 Chronic obstructive pulmonary disease, unspecified: Secondary | ICD-10-CM | POA: Diagnosis not present

## 2020-04-21 ENCOUNTER — Other Ambulatory Visit: Payer: Self-pay | Admitting: Physician Assistant

## 2020-05-02 DIAGNOSIS — J449 Chronic obstructive pulmonary disease, unspecified: Secondary | ICD-10-CM | POA: Diagnosis not present

## 2020-05-02 DIAGNOSIS — F17210 Nicotine dependence, cigarettes, uncomplicated: Secondary | ICD-10-CM | POA: Diagnosis not present

## 2020-05-02 DIAGNOSIS — R059 Cough, unspecified: Secondary | ICD-10-CM | POA: Diagnosis not present

## 2020-05-02 DIAGNOSIS — R911 Solitary pulmonary nodule: Secondary | ICD-10-CM | POA: Diagnosis not present

## 2020-05-06 DIAGNOSIS — J449 Chronic obstructive pulmonary disease, unspecified: Secondary | ICD-10-CM | POA: Diagnosis not present

## 2020-05-10 ENCOUNTER — Other Ambulatory Visit: Payer: Self-pay | Admitting: Physician Assistant

## 2020-05-10 DIAGNOSIS — I10 Essential (primary) hypertension: Secondary | ICD-10-CM

## 2020-06-05 DIAGNOSIS — J449 Chronic obstructive pulmonary disease, unspecified: Secondary | ICD-10-CM | POA: Diagnosis not present

## 2020-06-07 DIAGNOSIS — R911 Solitary pulmonary nodule: Secondary | ICD-10-CM | POA: Diagnosis not present

## 2020-06-08 DIAGNOSIS — J439 Emphysema, unspecified: Secondary | ICD-10-CM | POA: Diagnosis not present

## 2020-06-08 DIAGNOSIS — I313 Pericardial effusion (noninflammatory): Secondary | ICD-10-CM | POA: Diagnosis not present

## 2020-06-08 DIAGNOSIS — R911 Solitary pulmonary nodule: Secondary | ICD-10-CM | POA: Diagnosis not present

## 2020-06-20 DIAGNOSIS — Z9889 Other specified postprocedural states: Secondary | ICD-10-CM | POA: Diagnosis not present

## 2020-06-20 DIAGNOSIS — J449 Chronic obstructive pulmonary disease, unspecified: Secondary | ICD-10-CM | POA: Diagnosis not present

## 2020-06-20 DIAGNOSIS — I480 Paroxysmal atrial fibrillation: Secondary | ICD-10-CM | POA: Diagnosis not present

## 2020-06-20 DIAGNOSIS — R002 Palpitations: Secondary | ICD-10-CM | POA: Diagnosis not present

## 2020-06-20 DIAGNOSIS — U071 COVID-19: Secondary | ICD-10-CM | POA: Diagnosis not present

## 2020-07-06 DIAGNOSIS — J449 Chronic obstructive pulmonary disease, unspecified: Secondary | ICD-10-CM | POA: Diagnosis not present

## 2020-07-10 ENCOUNTER — Ambulatory Visit (INDEPENDENT_AMBULATORY_CARE_PROVIDER_SITE_OTHER): Payer: BC Managed Care – PPO | Admitting: Physician Assistant

## 2020-07-10 ENCOUNTER — Other Ambulatory Visit: Payer: Self-pay

## 2020-07-10 ENCOUNTER — Encounter: Payer: Self-pay | Admitting: Physician Assistant

## 2020-07-10 VITALS — BP 145/78 | HR 70 | Temp 98.1°F | Resp 20 | Ht 67.0 in | Wt 133.0 lb

## 2020-07-10 DIAGNOSIS — M5136 Other intervertebral disc degeneration, lumbar region: Secondary | ICD-10-CM

## 2020-07-10 DIAGNOSIS — M810 Age-related osteoporosis without current pathological fracture: Secondary | ICD-10-CM | POA: Diagnosis not present

## 2020-07-10 DIAGNOSIS — G894 Chronic pain syndrome: Secondary | ICD-10-CM | POA: Diagnosis not present

## 2020-07-10 DIAGNOSIS — K629 Disease of anus and rectum, unspecified: Secondary | ICD-10-CM | POA: Diagnosis not present

## 2020-07-10 DIAGNOSIS — I10 Essential (primary) hypertension: Secondary | ICD-10-CM

## 2020-07-10 DIAGNOSIS — M51369 Other intervertebral disc degeneration, lumbar region without mention of lumbar back pain or lower extremity pain: Secondary | ICD-10-CM

## 2020-07-10 MED ORDER — LOSARTAN POTASSIUM 50 MG PO TABS: 50.0000 mg | ORAL_TABLET | Freq: Every day | ORAL | 1 refills | Status: DC

## 2020-07-10 MED ORDER — ALENDRONATE SODIUM 70 MG PO TABS: ORAL_TABLET | ORAL | 3 refills | Status: DC

## 2020-07-10 MED ORDER — OXYCODONE-ACETAMINOPHEN 10-325 MG PO TABS: 1.0000 | ORAL_TABLET | Freq: Four times a day (QID) | ORAL | 0 refills | Status: DC | PRN

## 2020-07-10 NOTE — Progress Notes (Signed)
Subjective:    Patient ID: Sue Smith, female    DOB: 05-15-1955, 65 y.o.   MRN: [id number]  HPI Pt is a 65 yo female with HTN, PVC, COPD, gERD, chronic pain who presents to the clinic for 3 month follow up and refill.   She is doing well with her pain control. Needs refills of oxycodone.   For the last 6 months she has felt this hard area around the anus. She does not report pain but there is some irritation. She has used neosporin and cortisone cream with no benefit. She denies and bleeding or prior surgeries in that area. She has never had anal sex. No fever, chills, night sweats, unexplained weight loss.     .. Active Ambulatory Problems    Diagnosis Date Noted   Essential hypertension 11/04/2011   Surgical menopause 11/04/2011   Vasomotor flushing 11/18/2011   Pre-diabetes 11/20/2011   Personal history of colonic polyps 03/26/2012   Osteoporosis 11/25/2012   Hyperlipidemia 11/25/2012   DDD (degenerative disc disease), lumbar 03/23/2013   Insomnia 11/25/2013   COPD (chronic obstructive pulmonary disease) with chronic bronchitis ([place]) 02/16/2015   Simple chronic bronchitis ([place]) 08/27/2016   Actinic keratosis 11/29/2016   Chronic pain syndrome 11/29/2016   Gastroesophageal reflux disease 11/29/2016   Current smoker 02/26/2017   Grief 06/30/2017   No energy 07/29/2018   Irregular heart rhythm 05/06/2019   PAC (premature atrial contraction) 05/06/2019   PVC (premature ventricular contraction) 05/06/2019   Atrial fibrillation with RVR ([place]) 11/16/2019   COVID-19 virus infection 11/16/2019   Anal lesion 07/10/2020   Resolved Ambulatory Problems    Diagnosis Date Noted   Post-viral cough syndrome 11/16/2019   Past Medical History:  Diagnosis Date   Cervical dysplasia    COPD (chronic obstructive pulmonary disease) ([place])    Hypertension       Review of Systems    See HPI.  Objective:   Physical Exam Vitals reviewed.  HENT:     Head: Normocephalic.   Cardiovascular:     Rate and Rhythm: Normal rate and regular rhythm.  Pulmonary:     Effort: Pulmonary effort is normal.     Comments: Coarse breath sounds.  Genitourinary:    General: Normal vulva.     Comments:  Very frim area of induration from 12 to 3 oclock extending out 2.5cm with central area of verrcua like appearance about 1cm. Non tender and not warm.  Neurological:     General: No focal deficit present.     Mental Status: She is oriented to person, place, and time.  Psychiatric:        Mood and Affect: Mood normal.          Assessment & Plan:  Marland KitchenMarland KitchenJunella was seen today for cyst.  Diagnoses and all orders for this visit:  Anal lesion -     Ambulatory referral to Gastroenterology  Age-related osteoporosis without current pathological fracture -     alendronate (FOSAMAX) 70 MG tablet; TAKE ONE TABLET BY MOUTH ONCE A WEEK, TAKE WITH A FULL GLASS OF WATER ON AN EMPTY STOMACH  Essential hypertension -     losartan (COZAAR) 50 MG tablet; Take 1 tablet (50 mg total) by mouth daily.  Chronic pain syndrome -     oxyCODONE-acetaminophen (PERCOCET) 10-325 MG tablet; Take 1 tablet by mouth every 6 (six) hours as needed for pain. -     oxyCODONE-acetaminophen (PERCOCET) 10-325 MG tablet; Take 1 tablet by mouth every 6 (  six) hours as needed for pain. -     oxyCODONE-acetaminophen (PERCOCET) 10-325 MG tablet; Take 1 tablet by mouth every 6 (six) hours as needed for pain.  DDD (degenerative disc disease), lumbar -     oxyCODONE-acetaminophen (PERCOCET) 10-325 MG tablet; Take 1 tablet by mouth every 6 (six) hours as needed for pain. -     oxyCODONE-acetaminophen (PERCOCET) 10-325 MG tablet; Take 1 tablet by mouth every 6 (six) hours as needed for pain. -     oxyCODONE-acetaminophen (PERCOCET) 10-325 MG tablet; Take 1 tablet by mouth every 6 (six) hours as needed for pain.  Concerning and suspicious anal lesion. Will need biopsy.   Pain contract UTD.  UDS UTD.  Marland Kitchen.PDMP reviewed  during this encounter. Refilled oxycodone for 3 months. Follow up in 3 months due to [place] controlled substance law.   Bone density UTD.  Refilled fosamax.   BP up. Refilled cozaar. Continue to monitor BP at home.

## 2020-07-10 NOTE — Patient Instructions (Signed)
Will get referral for biopsy.

## 2020-07-13 DIAGNOSIS — K629 Disease of anus and rectum, unspecified: Secondary | ICD-10-CM | POA: Diagnosis not present

## 2020-07-13 DIAGNOSIS — Z800 Family history of malignant neoplasm of digestive organs: Secondary | ICD-10-CM | POA: Diagnosis not present

## 2020-07-13 DIAGNOSIS — Z86010 Personal history of colonic polyps: Secondary | ICD-10-CM | POA: Diagnosis not present

## 2020-07-20 ENCOUNTER — Encounter: Payer: Self-pay | Admitting: Physician Assistant

## 2020-07-20 DIAGNOSIS — K629 Disease of anus and rectum, unspecified: Secondary | ICD-10-CM | POA: Diagnosis not present

## 2020-07-20 DIAGNOSIS — C210 Malignant neoplasm of anus, unspecified: Secondary | ICD-10-CM | POA: Diagnosis not present

## 2020-07-26 DIAGNOSIS — C210 Malignant neoplasm of anus, unspecified: Secondary | ICD-10-CM | POA: Diagnosis not present

## 2020-08-01 DIAGNOSIS — J439 Emphysema, unspecified: Secondary | ICD-10-CM | POA: Diagnosis not present

## 2020-08-01 DIAGNOSIS — Z882 Allergy status to sulfonamides status: Secondary | ICD-10-CM | POA: Diagnosis not present

## 2020-08-01 DIAGNOSIS — F17210 Nicotine dependence, cigarettes, uncomplicated: Secondary | ICD-10-CM | POA: Diagnosis not present

## 2020-08-01 DIAGNOSIS — Z923 Personal history of irradiation: Secondary | ICD-10-CM | POA: Diagnosis not present

## 2020-08-01 DIAGNOSIS — Z881 Allergy status to other antibiotic agents status: Secondary | ICD-10-CM | POA: Diagnosis not present

## 2020-08-01 DIAGNOSIS — Z8616 Personal history of COVID-19: Secondary | ICD-10-CM | POA: Diagnosis not present

## 2020-08-01 DIAGNOSIS — Z79899 Other long term (current) drug therapy: Secondary | ICD-10-CM | POA: Diagnosis not present

## 2020-08-01 DIAGNOSIS — Z880 Allergy status to penicillin: Secondary | ICD-10-CM | POA: Diagnosis not present

## 2020-08-01 DIAGNOSIS — J449 Chronic obstructive pulmonary disease, unspecified: Secondary | ICD-10-CM | POA: Diagnosis not present

## 2020-08-01 DIAGNOSIS — C210 Malignant neoplasm of anus, unspecified: Secondary | ICD-10-CM | POA: Diagnosis not present

## 2020-08-01 DIAGNOSIS — I2510 Atherosclerotic heart disease of native coronary artery without angina pectoris: Secondary | ICD-10-CM | POA: Diagnosis not present

## 2020-08-01 DIAGNOSIS — I10 Essential (primary) hypertension: Secondary | ICD-10-CM | POA: Diagnosis not present

## 2020-08-01 DIAGNOSIS — R911 Solitary pulmonary nodule: Secondary | ICD-10-CM | POA: Diagnosis not present

## 2020-08-02 DIAGNOSIS — J449 Chronic obstructive pulmonary disease, unspecified: Secondary | ICD-10-CM | POA: Diagnosis not present

## 2020-08-02 DIAGNOSIS — R911 Solitary pulmonary nodule: Secondary | ICD-10-CM | POA: Diagnosis not present

## 2020-08-02 DIAGNOSIS — J3089 Other allergic rhinitis: Secondary | ICD-10-CM | POA: Diagnosis not present

## 2020-08-02 DIAGNOSIS — F17210 Nicotine dependence, cigarettes, uncomplicated: Secondary | ICD-10-CM | POA: Diagnosis not present

## 2020-08-04 DIAGNOSIS — Z510 Encounter for antineoplastic radiation therapy: Secondary | ICD-10-CM | POA: Diagnosis not present

## 2020-08-04 DIAGNOSIS — C210 Malignant neoplasm of anus, unspecified: Secondary | ICD-10-CM | POA: Diagnosis not present

## 2020-08-05 DIAGNOSIS — J449 Chronic obstructive pulmonary disease, unspecified: Secondary | ICD-10-CM | POA: Diagnosis not present

## 2020-08-16 DIAGNOSIS — C210 Malignant neoplasm of anus, unspecified: Secondary | ICD-10-CM | POA: Diagnosis not present

## 2020-08-17 DIAGNOSIS — C210 Malignant neoplasm of anus, unspecified: Secondary | ICD-10-CM | POA: Diagnosis not present

## 2020-08-17 DIAGNOSIS — Z510 Encounter for antineoplastic radiation therapy: Secondary | ICD-10-CM | POA: Diagnosis not present

## 2020-08-18 DIAGNOSIS — C210 Malignant neoplasm of anus, unspecified: Secondary | ICD-10-CM | POA: Diagnosis not present

## 2020-08-18 DIAGNOSIS — Z452 Encounter for adjustment and management of vascular access device: Secondary | ICD-10-CM | POA: Diagnosis not present

## 2020-08-21 DIAGNOSIS — R718 Other abnormality of red blood cells: Secondary | ICD-10-CM | POA: Diagnosis not present

## 2020-08-21 DIAGNOSIS — Z79899 Other long term (current) drug therapy: Secondary | ICD-10-CM | POA: Diagnosis not present

## 2020-08-21 DIAGNOSIS — F17210 Nicotine dependence, cigarettes, uncomplicated: Secondary | ICD-10-CM | POA: Diagnosis not present

## 2020-08-21 DIAGNOSIS — Z880 Allergy status to penicillin: Secondary | ICD-10-CM | POA: Diagnosis not present

## 2020-08-21 DIAGNOSIS — Z510 Encounter for antineoplastic radiation therapy: Secondary | ICD-10-CM | POA: Diagnosis not present

## 2020-08-21 DIAGNOSIS — Z95828 Presence of other vascular implants and grafts: Secondary | ICD-10-CM | POA: Diagnosis not present

## 2020-08-21 DIAGNOSIS — Z882 Allergy status to sulfonamides status: Secondary | ICD-10-CM | POA: Diagnosis not present

## 2020-08-21 DIAGNOSIS — Z7982 Long term (current) use of aspirin: Secondary | ICD-10-CM | POA: Diagnosis not present

## 2020-08-21 DIAGNOSIS — D72829 Elevated white blood cell count, unspecified: Secondary | ICD-10-CM | POA: Diagnosis not present

## 2020-08-21 DIAGNOSIS — Z5181 Encounter for therapeutic drug level monitoring: Secondary | ICD-10-CM | POA: Diagnosis not present

## 2020-08-21 DIAGNOSIS — R112 Nausea with vomiting, unspecified: Secondary | ICD-10-CM | POA: Diagnosis not present

## 2020-08-21 DIAGNOSIS — C210 Malignant neoplasm of anus, unspecified: Secondary | ICD-10-CM | POA: Diagnosis not present

## 2020-08-21 DIAGNOSIS — Z5111 Encounter for antineoplastic chemotherapy: Secondary | ICD-10-CM | POA: Diagnosis not present

## 2020-08-21 DIAGNOSIS — J449 Chronic obstructive pulmonary disease, unspecified: Secondary | ICD-10-CM | POA: Diagnosis not present

## 2020-08-21 DIAGNOSIS — T451X5A Adverse effect of antineoplastic and immunosuppressive drugs, initial encounter: Secondary | ICD-10-CM | POA: Diagnosis not present

## 2020-08-21 DIAGNOSIS — Z881 Allergy status to other antibiotic agents status: Secondary | ICD-10-CM | POA: Diagnosis not present

## 2020-08-22 DIAGNOSIS — Z510 Encounter for antineoplastic radiation therapy: Secondary | ICD-10-CM | POA: Diagnosis not present

## 2020-08-22 DIAGNOSIS — C210 Malignant neoplasm of anus, unspecified: Secondary | ICD-10-CM | POA: Diagnosis not present

## 2020-08-23 DIAGNOSIS — C210 Malignant neoplasm of anus, unspecified: Secondary | ICD-10-CM | POA: Diagnosis not present

## 2020-08-23 DIAGNOSIS — Z510 Encounter for antineoplastic radiation therapy: Secondary | ICD-10-CM | POA: Diagnosis not present

## 2020-08-24 DIAGNOSIS — C210 Malignant neoplasm of anus, unspecified: Secondary | ICD-10-CM | POA: Diagnosis not present

## 2020-08-24 DIAGNOSIS — Z510 Encounter for antineoplastic radiation therapy: Secondary | ICD-10-CM | POA: Diagnosis not present

## 2020-08-25 DIAGNOSIS — Z510 Encounter for antineoplastic radiation therapy: Secondary | ICD-10-CM | POA: Diagnosis not present

## 2020-08-25 DIAGNOSIS — C210 Malignant neoplasm of anus, unspecified: Secondary | ICD-10-CM | POA: Diagnosis not present

## 2020-08-25 DIAGNOSIS — Z95828 Presence of other vascular implants and grafts: Secondary | ICD-10-CM | POA: Diagnosis not present

## 2020-08-28 DIAGNOSIS — Z510 Encounter for antineoplastic radiation therapy: Secondary | ICD-10-CM | POA: Diagnosis not present

## 2020-08-28 DIAGNOSIS — C210 Malignant neoplasm of anus, unspecified: Secondary | ICD-10-CM | POA: Diagnosis not present

## 2020-08-29 DIAGNOSIS — C210 Malignant neoplasm of anus, unspecified: Secondary | ICD-10-CM | POA: Diagnosis not present

## 2020-08-29 DIAGNOSIS — Z510 Encounter for antineoplastic radiation therapy: Secondary | ICD-10-CM | POA: Diagnosis not present

## 2020-08-31 DIAGNOSIS — Z510 Encounter for antineoplastic radiation therapy: Secondary | ICD-10-CM | POA: Diagnosis not present

## 2020-08-31 DIAGNOSIS — C210 Malignant neoplasm of anus, unspecified: Secondary | ICD-10-CM | POA: Diagnosis not present

## 2020-09-01 DIAGNOSIS — C210 Malignant neoplasm of anus, unspecified: Secondary | ICD-10-CM | POA: Diagnosis not present

## 2020-09-01 DIAGNOSIS — Z510 Encounter for antineoplastic radiation therapy: Secondary | ICD-10-CM | POA: Diagnosis not present

## 2020-09-05 DIAGNOSIS — R079 Chest pain, unspecified: Secondary | ICD-10-CM | POA: Diagnosis not present

## 2020-09-05 DIAGNOSIS — J449 Chronic obstructive pulmonary disease, unspecified: Secondary | ICD-10-CM | POA: Diagnosis not present

## 2020-09-05 DIAGNOSIS — G894 Chronic pain syndrome: Secondary | ICD-10-CM | POA: Diagnosis not present

## 2020-09-05 DIAGNOSIS — I313 Pericardial effusion (noninflammatory): Secondary | ICD-10-CM | POA: Diagnosis not present

## 2020-09-05 DIAGNOSIS — D696 Thrombocytopenia, unspecified: Secondary | ICD-10-CM | POA: Diagnosis not present

## 2020-09-05 DIAGNOSIS — R7989 Other specified abnormal findings of blood chemistry: Secondary | ICD-10-CM | POA: Diagnosis not present

## 2020-09-05 DIAGNOSIS — Z9889 Other specified postprocedural states: Secondary | ICD-10-CM | POA: Diagnosis not present

## 2020-09-05 DIAGNOSIS — I480 Paroxysmal atrial fibrillation: Secondary | ICD-10-CM | POA: Diagnosis not present

## 2020-09-05 DIAGNOSIS — D709 Neutropenia, unspecified: Secondary | ICD-10-CM | POA: Diagnosis not present

## 2020-09-05 DIAGNOSIS — R0602 Shortness of breath: Secondary | ICD-10-CM | POA: Diagnosis not present

## 2020-09-05 DIAGNOSIS — C210 Malignant neoplasm of anus, unspecified: Secondary | ICD-10-CM | POA: Diagnosis not present

## 2020-09-05 DIAGNOSIS — G4700 Insomnia, unspecified: Secondary | ICD-10-CM | POA: Diagnosis not present

## 2020-09-05 DIAGNOSIS — B373 Candidiasis of vulva and vagina: Secondary | ICD-10-CM | POA: Diagnosis not present

## 2020-09-05 DIAGNOSIS — C211 Malignant neoplasm of anal canal: Secondary | ICD-10-CM | POA: Diagnosis not present

## 2020-09-05 DIAGNOSIS — J441 Chronic obstructive pulmonary disease with (acute) exacerbation: Secondary | ICD-10-CM | POA: Diagnosis not present

## 2020-09-05 DIAGNOSIS — Z20822 Contact with and (suspected) exposure to covid-19: Secondary | ICD-10-CM | POA: Diagnosis not present

## 2020-09-05 DIAGNOSIS — Z8679 Personal history of other diseases of the circulatory system: Secondary | ICD-10-CM | POA: Diagnosis not present

## 2020-09-05 DIAGNOSIS — J984 Other disorders of lung: Secondary | ICD-10-CM | POA: Diagnosis not present

## 2020-09-05 DIAGNOSIS — F17210 Nicotine dependence, cigarettes, uncomplicated: Secondary | ICD-10-CM | POA: Diagnosis not present

## 2020-09-05 DIAGNOSIS — J9611 Chronic respiratory failure with hypoxia: Secondary | ICD-10-CM | POA: Diagnosis not present

## 2020-09-05 DIAGNOSIS — Z9981 Dependence on supplemental oxygen: Secondary | ICD-10-CM | POA: Diagnosis not present

## 2020-09-05 DIAGNOSIS — J432 Centrilobular emphysema: Secondary | ICD-10-CM | POA: Diagnosis not present

## 2020-09-05 DIAGNOSIS — R918 Other nonspecific abnormal finding of lung field: Secondary | ICD-10-CM | POA: Diagnosis not present

## 2020-09-05 DIAGNOSIS — E785 Hyperlipidemia, unspecified: Secondary | ICD-10-CM | POA: Diagnosis not present

## 2020-09-06 DIAGNOSIS — J441 Chronic obstructive pulmonary disease with (acute) exacerbation: Secondary | ICD-10-CM | POA: Diagnosis not present

## 2020-09-08 DIAGNOSIS — C210 Malignant neoplasm of anus, unspecified: Secondary | ICD-10-CM | POA: Diagnosis not present

## 2020-09-11 DIAGNOSIS — Z510 Encounter for antineoplastic radiation therapy: Secondary | ICD-10-CM | POA: Diagnosis not present

## 2020-09-11 DIAGNOSIS — C210 Malignant neoplasm of anus, unspecified: Secondary | ICD-10-CM | POA: Diagnosis not present

## 2020-09-12 DIAGNOSIS — Z510 Encounter for antineoplastic radiation therapy: Secondary | ICD-10-CM | POA: Diagnosis not present

## 2020-09-12 DIAGNOSIS — C210 Malignant neoplasm of anus, unspecified: Secondary | ICD-10-CM | POA: Diagnosis not present

## 2020-09-13 DIAGNOSIS — C210 Malignant neoplasm of anus, unspecified: Secondary | ICD-10-CM | POA: Diagnosis not present

## 2020-09-13 DIAGNOSIS — Z510 Encounter for antineoplastic radiation therapy: Secondary | ICD-10-CM | POA: Diagnosis not present

## 2020-09-14 DIAGNOSIS — Z510 Encounter for antineoplastic radiation therapy: Secondary | ICD-10-CM | POA: Diagnosis not present

## 2020-09-14 DIAGNOSIS — C210 Malignant neoplasm of anus, unspecified: Secondary | ICD-10-CM | POA: Diagnosis not present

## 2020-09-15 DIAGNOSIS — C210 Malignant neoplasm of anus, unspecified: Secondary | ICD-10-CM | POA: Diagnosis not present

## 2020-09-15 DIAGNOSIS — Z510 Encounter for antineoplastic radiation therapy: Secondary | ICD-10-CM | POA: Diagnosis not present

## 2020-09-19 DIAGNOSIS — Z881 Allergy status to other antibiotic agents status: Secondary | ICD-10-CM | POA: Diagnosis not present

## 2020-09-19 DIAGNOSIS — Z880 Allergy status to penicillin: Secondary | ICD-10-CM | POA: Diagnosis not present

## 2020-09-19 DIAGNOSIS — D6959 Other secondary thrombocytopenia: Secondary | ICD-10-CM | POA: Diagnosis not present

## 2020-09-19 DIAGNOSIS — K130 Diseases of lips: Secondary | ICD-10-CM | POA: Diagnosis not present

## 2020-09-19 DIAGNOSIS — F17210 Nicotine dependence, cigarettes, uncomplicated: Secondary | ICD-10-CM | POA: Diagnosis not present

## 2020-09-19 DIAGNOSIS — R5383 Other fatigue: Secondary | ICD-10-CM | POA: Diagnosis not present

## 2020-09-19 DIAGNOSIS — Z95828 Presence of other vascular implants and grafts: Secondary | ICD-10-CM | POA: Diagnosis not present

## 2020-09-19 DIAGNOSIS — Z882 Allergy status to sulfonamides status: Secondary | ICD-10-CM | POA: Diagnosis not present

## 2020-09-19 DIAGNOSIS — J449 Chronic obstructive pulmonary disease, unspecified: Secondary | ICD-10-CM | POA: Diagnosis not present

## 2020-09-19 DIAGNOSIS — Z510 Encounter for antineoplastic radiation therapy: Secondary | ICD-10-CM | POA: Diagnosis not present

## 2020-09-19 DIAGNOSIS — T451X5A Adverse effect of antineoplastic and immunosuppressive drugs, initial encounter: Secondary | ICD-10-CM | POA: Diagnosis not present

## 2020-09-19 DIAGNOSIS — C210 Malignant neoplasm of anus, unspecified: Secondary | ICD-10-CM | POA: Diagnosis not present

## 2020-09-19 DIAGNOSIS — Z79899 Other long term (current) drug therapy: Secondary | ICD-10-CM | POA: Diagnosis not present

## 2020-09-19 DIAGNOSIS — D701 Agranulocytosis secondary to cancer chemotherapy: Secondary | ICD-10-CM | POA: Diagnosis not present

## 2020-09-19 DIAGNOSIS — R112 Nausea with vomiting, unspecified: Secondary | ICD-10-CM | POA: Diagnosis not present

## 2020-09-19 DIAGNOSIS — Z5111 Encounter for antineoplastic chemotherapy: Secondary | ICD-10-CM | POA: Diagnosis not present

## 2020-09-20 DIAGNOSIS — Z510 Encounter for antineoplastic radiation therapy: Secondary | ICD-10-CM | POA: Diagnosis not present

## 2020-09-20 DIAGNOSIS — C210 Malignant neoplasm of anus, unspecified: Secondary | ICD-10-CM | POA: Diagnosis not present

## 2020-09-21 DIAGNOSIS — Z510 Encounter for antineoplastic radiation therapy: Secondary | ICD-10-CM | POA: Diagnosis not present

## 2020-09-21 DIAGNOSIS — C210 Malignant neoplasm of anus, unspecified: Secondary | ICD-10-CM | POA: Diagnosis not present

## 2020-09-22 DIAGNOSIS — C210 Malignant neoplasm of anus, unspecified: Secondary | ICD-10-CM | POA: Diagnosis not present

## 2020-09-22 DIAGNOSIS — Z510 Encounter for antineoplastic radiation therapy: Secondary | ICD-10-CM | POA: Diagnosis not present

## 2020-09-22 DIAGNOSIS — Z95828 Presence of other vascular implants and grafts: Secondary | ICD-10-CM | POA: Diagnosis not present

## 2020-09-25 DIAGNOSIS — Z510 Encounter for antineoplastic radiation therapy: Secondary | ICD-10-CM | POA: Diagnosis not present

## 2020-09-25 DIAGNOSIS — C210 Malignant neoplasm of anus, unspecified: Secondary | ICD-10-CM | POA: Diagnosis not present

## 2020-09-26 DIAGNOSIS — Z510 Encounter for antineoplastic radiation therapy: Secondary | ICD-10-CM | POA: Diagnosis not present

## 2020-09-26 DIAGNOSIS — C210 Malignant neoplasm of anus, unspecified: Secondary | ICD-10-CM | POA: Diagnosis not present

## 2020-09-27 DIAGNOSIS — B370 Candidal stomatitis: Secondary | ICD-10-CM | POA: Diagnosis not present

## 2020-09-27 DIAGNOSIS — R918 Other nonspecific abnormal finding of lung field: Secondary | ICD-10-CM | POA: Diagnosis not present

## 2020-09-27 DIAGNOSIS — J441 Chronic obstructive pulmonary disease with (acute) exacerbation: Secondary | ICD-10-CM | POA: Diagnosis not present

## 2020-09-27 DIAGNOSIS — Z510 Encounter for antineoplastic radiation therapy: Secondary | ICD-10-CM | POA: Diagnosis not present

## 2020-09-27 DIAGNOSIS — C210 Malignant neoplasm of anus, unspecified: Secondary | ICD-10-CM | POA: Diagnosis not present

## 2020-09-29 DIAGNOSIS — Z510 Encounter for antineoplastic radiation therapy: Secondary | ICD-10-CM | POA: Diagnosis not present

## 2020-09-29 DIAGNOSIS — C210 Malignant neoplasm of anus, unspecified: Secondary | ICD-10-CM | POA: Diagnosis not present

## 2020-10-02 DIAGNOSIS — Z510 Encounter for antineoplastic radiation therapy: Secondary | ICD-10-CM | POA: Diagnosis not present

## 2020-10-02 DIAGNOSIS — C210 Malignant neoplasm of anus, unspecified: Secondary | ICD-10-CM | POA: Diagnosis not present

## 2020-10-03 DIAGNOSIS — C210 Malignant neoplasm of anus, unspecified: Secondary | ICD-10-CM | POA: Diagnosis not present

## 2020-10-05 DIAGNOSIS — N179 Acute kidney failure, unspecified: Secondary | ICD-10-CM | POA: Diagnosis not present

## 2020-10-06 DIAGNOSIS — Z510 Encounter for antineoplastic radiation therapy: Secondary | ICD-10-CM | POA: Diagnosis not present

## 2020-10-06 DIAGNOSIS — C210 Malignant neoplasm of anus, unspecified: Secondary | ICD-10-CM | POA: Diagnosis not present

## 2020-10-06 DIAGNOSIS — J449 Chronic obstructive pulmonary disease, unspecified: Secondary | ICD-10-CM | POA: Diagnosis not present

## 2020-10-09 DIAGNOSIS — C210 Malignant neoplasm of anus, unspecified: Secondary | ICD-10-CM | POA: Diagnosis not present

## 2020-10-09 DIAGNOSIS — Z510 Encounter for antineoplastic radiation therapy: Secondary | ICD-10-CM | POA: Diagnosis not present

## 2020-10-10 DIAGNOSIS — Z510 Encounter for antineoplastic radiation therapy: Secondary | ICD-10-CM | POA: Diagnosis not present

## 2020-10-10 DIAGNOSIS — C210 Malignant neoplasm of anus, unspecified: Secondary | ICD-10-CM | POA: Diagnosis not present

## 2020-10-11 DIAGNOSIS — Z510 Encounter for antineoplastic radiation therapy: Secondary | ICD-10-CM | POA: Diagnosis not present

## 2020-10-11 DIAGNOSIS — C210 Malignant neoplasm of anus, unspecified: Secondary | ICD-10-CM | POA: Diagnosis not present

## 2020-10-12 DIAGNOSIS — C210 Malignant neoplasm of anus, unspecified: Secondary | ICD-10-CM | POA: Diagnosis not present

## 2020-10-12 DIAGNOSIS — Z510 Encounter for antineoplastic radiation therapy: Secondary | ICD-10-CM | POA: Diagnosis not present

## 2020-10-17 DIAGNOSIS — T451X5A Adverse effect of antineoplastic and immunosuppressive drugs, initial encounter: Secondary | ICD-10-CM | POA: Diagnosis not present

## 2020-10-17 DIAGNOSIS — Z95828 Presence of other vascular implants and grafts: Secondary | ICD-10-CM | POA: Diagnosis not present

## 2020-10-17 DIAGNOSIS — C210 Malignant neoplasm of anus, unspecified: Secondary | ICD-10-CM | POA: Diagnosis not present

## 2020-10-17 DIAGNOSIS — D6481 Anemia due to antineoplastic chemotherapy: Secondary | ICD-10-CM | POA: Diagnosis not present

## 2020-10-17 DIAGNOSIS — D6959 Other secondary thrombocytopenia: Secondary | ICD-10-CM | POA: Diagnosis not present

## 2020-10-18 ENCOUNTER — Encounter: Payer: Self-pay | Admitting: Physician Assistant

## 2020-10-18 ENCOUNTER — Telehealth (INDEPENDENT_AMBULATORY_CARE_PROVIDER_SITE_OTHER): Payer: BC Managed Care – PPO | Admitting: Physician Assistant

## 2020-10-18 VITALS — BP 147/102 | Temp 98.2°F | Ht 67.0 in | Wt 120.0 lb

## 2020-10-18 DIAGNOSIS — G894 Chronic pain syndrome: Secondary | ICD-10-CM | POA: Diagnosis not present

## 2020-10-18 DIAGNOSIS — C210 Malignant neoplasm of anus, unspecified: Secondary | ICD-10-CM | POA: Diagnosis not present

## 2020-10-18 DIAGNOSIS — J449 Chronic obstructive pulmonary disease, unspecified: Secondary | ICD-10-CM

## 2020-10-18 DIAGNOSIS — I10 Essential (primary) hypertension: Secondary | ICD-10-CM

## 2020-10-18 DIAGNOSIS — M5136 Other intervertebral disc degeneration, lumbar region: Secondary | ICD-10-CM

## 2020-10-18 MED ORDER — OXYCODONE-ACETAMINOPHEN 10-325 MG PO TABS: 1.0000 | ORAL_TABLET | Freq: Four times a day (QID) | ORAL | 0 refills | Status: DC | PRN

## 2020-10-18 NOTE — Progress Notes (Signed)
Patient ID: Sue Smith, female   DOB: March 26, 1955, 65 y.o.   MRN: [id number] .Marland KitchenVirtual Visit via Video Note  I connected with Sue Smith on 10/18/20 at 10:30 AM EDT by a video enabled telemedicine application and verified that I am speaking with the correct person using two identifiers.  Location: Patient: home Provider: clinic  .Marland KitchenParticipating in visit:  Patient: Sue Smith Provider: Iran Planas PA-C Provider in training: Jari Favre PA-Student   I discussed the limitations of evaluation and management by telemedicine and the availability of in person appointments. The patient expressed understanding and agreed to proceed.  History of Present Illness: Pt is a 65 yo female with current anal cancer, HTN, COPD, HLD, pre-diabetes, Afib and chronic pain who presents to the clinic for medication follow up.   Pt needs refills on oxycodone. Doing well. No concerns.   She has just completed her chemo and radiation for anal cancer.  She is lost 12 pounds and very fatigued.  She is now working to get her white blood count up as well as her energy.  It is looking hopeful that she will not have to have any more radiation or chemotherapy.  She does question what she can do to stay healthy during the winter.  She does not want to get sick.  Right now her breathing is well controlled on her inhalers.  She has been checking her blood pressure and denies any chest pain, palpitations, headaches or dizziness.  Her blood pressures have been running high at 140s over 100s. She is taking cozaar 50mg  daily.    .. Active Ambulatory Problems    Diagnosis Date Noted   Essential hypertension 11/04/2011   Surgical menopause 11/04/2011   Vasomotor flushing 11/18/2011   Pre-diabetes 11/20/2011   Personal history of colonic polyps 03/26/2012   Osteoporosis 11/25/2012   Hyperlipidemia 11/25/2012   DDD (degenerative disc disease), lumbar 03/23/2013   Insomnia 11/25/2013   COPD (chronic obstructive  pulmonary disease) with chronic bronchitis ([place]) 02/16/2015   Simple chronic bronchitis ([place]) 08/27/2016   Actinic keratosis 11/29/2016   Chronic pain syndrome 11/29/2016   Gastroesophageal reflux disease 11/29/2016   Current smoker 02/26/2017   Grief 06/30/2017   No energy 07/29/2018   Irregular heart rhythm 05/06/2019   PAC (premature atrial contraction) 05/06/2019   PVC (premature ventricular contraction) 05/06/2019   Atrial fibrillation with RVR ([place]) 11/16/2019   COVID-19 virus infection 11/16/2019   Anal lesion 07/10/2020   Anal cancer ([place]) 10/18/2020   Resolved Ambulatory Problems    Diagnosis Date Noted   Post-viral cough syndrome 11/16/2019   Past Medical History:  Diagnosis Date   Cervical dysplasia    COPD (chronic obstructive pulmonary disease) ([place])    Hypertension     Observations/Objective: No acute distress Normal breathing Normal appearance and mood.   .. Today's Vitals   10/18/20 0950  BP: (!) 147/102  Temp: 98.2 F (36.8 C)  SpO2: 98%  Weight: 120 lb (54.4 kg)  Height: 5\' 7"  (1.702 m)   Body mass index is 18.79 kg/m.    Assessment and Plan: Marland KitchenMarland KitchenZoya was seen today for follow-up.  Diagnoses and all orders for this visit:  Chronic pain syndrome -     oxyCODONE-acetaminophen (PERCOCET) 10-325 MG tablet; Take 1 tablet by mouth every 6 (six) hours as needed for pain. -     oxyCODONE-acetaminophen (PERCOCET) 10-325 MG tablet; Take 1 tablet by mouth every 6 (six) hours as needed for pain. -     oxyCODONE-acetaminophen (  PERCOCET) 10-325 MG tablet; Take 1 tablet by mouth every 6 (six) hours as needed for pain.  DDD (degenerative disc disease), lumbar -     oxyCODONE-acetaminophen (PERCOCET) 10-325 MG tablet; Take 1 tablet by mouth every 6 (six) hours as needed for pain. -     oxyCODONE-acetaminophen (PERCOCET) 10-325 MG tablet; Take 1 tablet by mouth every 6 (six) hours as needed for pain. -     oxyCODONE-acetaminophen (PERCOCET) 10-325 MG tablet;  Take 1 tablet by mouth every 6 (six) hours as needed for pain.  Anal cancer [facility])  Essential hypertension  COPD (chronic obstructive pulmonary disease) with chronic bronchitis ([place])  Patient does need to come in office at next visit to sign pain contract.  Last pain contract was signed April 2021. We will get a UDS at this time as well. No concerns. Marland KitchenMarland KitchenPDMP reviewed during this encounter.  Oxycodone was refilled for 3 months. Discussed need to 5-month follow-up due to [facility] controlled substance laws on management.  COPD seems to be controlled on Trelegy and low-dose prednisone.  She rarely uses her albuterol.  Blood pressure is elevated today and she is checking at home with frequent elevation.  Increase Cozaar to 100 mg.  Patient will check her blood pressure at home and report readings in the next 2 weeks to see if this is appropriate to send to 100 mg daily.  Discussed importance of blood pressure control.  Patient has just completed her chemo and radiation for anal cancer with oncology.  Continue follow-up with oncology.  Discussed ways to keep her immune system up during the winter.  For now until her white blood count goes back to normal I would stay away from people.  And frequently wash hands.  Would also consider zinc 50 mg and vitamin C 500 mg daily.    Follow Up Instructions:    I discussed the assessment and treatment plan with the patient. The patient was provided an opportunity to ask questions and all were answered. The patient agreed with the plan and demonstrated an understanding of the instructions.   The patient was advised to call back or seek an in-person evaluation if the symptoms worsen or if the condition fails to improve as anticipated.    Iran Planas, PA-C

## 2020-10-18 NOTE — Progress Notes (Signed)
Patient will check blood pressure and give to [place] at visit  Needs refills of pain medication Needs updated contract - last written in April 2021 (last UDS at that time as well)  Still recovering from chemo-radiation  Has a cold and wants to discuss what she can take OTC

## 2020-11-03 ENCOUNTER — Encounter: Payer: Self-pay | Admitting: Physician Assistant

## 2020-11-03 DIAGNOSIS — F17210 Nicotine dependence, cigarettes, uncomplicated: Secondary | ICD-10-CM | POA: Diagnosis not present

## 2020-11-03 DIAGNOSIS — R911 Solitary pulmonary nodule: Secondary | ICD-10-CM | POA: Diagnosis not present

## 2020-11-03 DIAGNOSIS — R0602 Shortness of breath: Secondary | ICD-10-CM | POA: Diagnosis not present

## 2020-11-03 DIAGNOSIS — Z23 Encounter for immunization: Secondary | ICD-10-CM | POA: Diagnosis not present

## 2020-11-03 DIAGNOSIS — J449 Chronic obstructive pulmonary disease, unspecified: Secondary | ICD-10-CM | POA: Diagnosis not present

## 2020-11-05 DIAGNOSIS — J449 Chronic obstructive pulmonary disease, unspecified: Secondary | ICD-10-CM | POA: Diagnosis not present

## 2020-11-06 MED ORDER — AMLODIPINE BESYLATE 2.5 MG PO TABS: 2.5000 mg | ORAL_TABLET | Freq: Every day | ORAL | 0 refills | Status: DC

## 2020-11-06 MED ORDER — LOSARTAN POTASSIUM 100 MG PO TABS: 100.0000 mg | ORAL_TABLET | Freq: Every day | ORAL | 0 refills | Status: DC

## 2020-11-28 DIAGNOSIS — C210 Malignant neoplasm of anus, unspecified: Secondary | ICD-10-CM | POA: Diagnosis not present

## 2020-11-28 DIAGNOSIS — Z95828 Presence of other vascular implants and grafts: Secondary | ICD-10-CM | POA: Diagnosis not present

## 2020-12-06 DIAGNOSIS — J449 Chronic obstructive pulmonary disease, unspecified: Secondary | ICD-10-CM | POA: Diagnosis not present

## 2020-12-11 ENCOUNTER — Encounter: Payer: Self-pay | Admitting: Physician Assistant

## 2020-12-11 ENCOUNTER — Other Ambulatory Visit: Payer: Self-pay | Admitting: Physician Assistant

## 2020-12-11 DIAGNOSIS — Z1231 Encounter for screening mammogram for malignant neoplasm of breast: Secondary | ICD-10-CM

## 2020-12-12 DIAGNOSIS — C210 Malignant neoplasm of anus, unspecified: Secondary | ICD-10-CM | POA: Diagnosis not present

## 2020-12-19 ENCOUNTER — Other Ambulatory Visit: Payer: Self-pay

## 2020-12-19 ENCOUNTER — Ambulatory Visit (INDEPENDENT_AMBULATORY_CARE_PROVIDER_SITE_OTHER): Payer: BC Managed Care – PPO | Admitting: Physician Assistant

## 2020-12-19 ENCOUNTER — Other Ambulatory Visit: Payer: Self-pay | Admitting: Physician Assistant

## 2020-12-19 ENCOUNTER — Encounter: Payer: Self-pay | Admitting: Physician Assistant

## 2020-12-19 VITALS — BP 150/88 | HR 87 | Ht 67.0 in | Wt 124.0 lb

## 2020-12-19 DIAGNOSIS — G894 Chronic pain syndrome: Secondary | ICD-10-CM | POA: Diagnosis not present

## 2020-12-19 DIAGNOSIS — E782 Mixed hyperlipidemia: Secondary | ICD-10-CM

## 2020-12-19 DIAGNOSIS — Z114 Encounter for screening for human immunodeficiency virus [HIV]: Secondary | ICD-10-CM | POA: Diagnosis not present

## 2020-12-19 DIAGNOSIS — M810 Age-related osteoporosis without current pathological fracture: Secondary | ICD-10-CM

## 2020-12-19 DIAGNOSIS — Z1231 Encounter for screening mammogram for malignant neoplasm of breast: Secondary | ICD-10-CM

## 2020-12-19 DIAGNOSIS — I10 Essential (primary) hypertension: Secondary | ICD-10-CM | POA: Diagnosis not present

## 2020-12-19 DIAGNOSIS — Z0000 Encounter for general adult medical examination without abnormal findings: Secondary | ICD-10-CM

## 2020-12-19 DIAGNOSIS — M5136 Other intervertebral disc degeneration, lumbar region: Secondary | ICD-10-CM

## 2020-12-19 DIAGNOSIS — E538 Deficiency of other specified B group vitamins: Secondary | ICD-10-CM | POA: Diagnosis not present

## 2020-12-19 MED ORDER — ATORVASTATIN CALCIUM 20 MG PO TABS: 20.0000 mg | ORAL_TABLET | Freq: Every day | ORAL | 1 refills | Status: DC

## 2020-12-19 MED ORDER — LOSARTAN POTASSIUM 100 MG PO TABS: 100.0000 mg | ORAL_TABLET | Freq: Every day | ORAL | 1 refills | Status: DC

## 2020-12-19 MED ORDER — OXYCODONE-ACETAMINOPHEN 10-325 MG PO TABS: 1.0000 | ORAL_TABLET | Freq: Four times a day (QID) | ORAL | 0 refills | Status: DC | PRN

## 2020-12-19 MED ORDER — AMLODIPINE BESYLATE 2.5 MG PO TABS: 2.5000 mg | ORAL_TABLET | Freq: Every day | ORAL | 1 refills | Status: DC

## 2020-12-19 NOTE — Patient Instructions (Signed)

## 2020-12-19 NOTE — Progress Notes (Addendum)
Subjective:     Sue Smith is a 65 y.o. female and is here for a comprehensive physical exam. The patient reports problems - pt was dx with anal cancer in July and has completed 2 rounds of chemo and 28 treatments of radation . She is now cancer free. She has lost 15lbs. She is more short of breath and having to use oxygen more.   Social History   Socioeconomic History   Marital status: Widowed    Spouse name: Not on file   Number of children: 2   Years of education: Not on file   Highest education level: Not on file  Occupational History    Comment: Retired  Tobacco Use   Smoking status: Former    Packs/day: 0.50    Types: Cigarettes    Quit date: 11/02/2019    Years since quitting: 1.1   Smokeless tobacco: Never  Vaping Use   Vaping Use: Never used  Substance and Sexual Activity   Alcohol use: Never   Drug use: No   Sexual activity: Not on file  Other Topics Concern   Not on file  Social History Narrative   Not on file   Social Determinants of Health   Financial Resource Strain: Not on file  Food Insecurity: Not on file  Transportation Needs: Not on file  Physical Activity: Not on file  Stress: Not on file  Social Connections: Not on file  Intimate Partner Violence: Not on file   Health Maintenance  Topic Date Due   MAMMOGRAM  01/12/2021   DEXA SCAN  01/12/2021   COLONOSCOPY (Pts 45-31yrs Insurance coverage will need to be confirmed)  07/01/2022   Pneumococcal Vaccine 55-79 Years old (3 - PPSV23 if available, else PCV20) 12/16/2023   TETANUS/TDAP  07/28/2028   INFLUENZA VACCINE  Completed   Hepatitis C Screening  Completed   HIV Screening  Completed   Zoster Vaccines- Shingrix  Completed   HPV VACCINES  Aged Out   PAP SMEAR-Modifier  Discontinued   COVID-19 Vaccine  Discontinued    The following portions of the patient's history were reviewed and updated as appropriate: allergies, current medications, past family history, past medical history, past  social history, past surgical history, and problem list.  Review of Systems Pertinent items noted in HPI and remainder of comprehensive ROS otherwise negative.   Objective:    BP (!) 167/76   Pulse 87   Ht 5\' 7"  (1.702 m)   Wt 124 lb (56.2 kg)   SpO2 97% Comment: 2L  BMI 19.42 kg/m  General appearance: alert and cooperative Head: Normocephalic, without obvious abnormality, atraumatic Eyes: conjunctivae/corneas clear. PERRL, EOM's intact. Fundi benign. Ears: normal TM's and external ear canals both ears Nose: Nares normal. Septum midline. Mucosa normal. No drainage or sinus tenderness. Throat: lips, mucosa, and tongue normal; teeth and gums normal Neck: no adenopathy, no carotid bruit, no JVD, supple, symmetrical, trachea midline, and thyroid not enlarged, symmetric, no tenderness/mass/nodules Back: symmetric, no curvature. ROM normal. No CVA tenderness. Lungs: clear to auscultation bilaterally Heart: regular rate and rhythm, S1, S2 normal, no murmur, click, rub or gallop Abdomen: soft, non-tender; bowel sounds normal; no masses,  no organomegaly Extremities: extremities normal, atraumatic, no cyanosis or edema Pulses: 2+ and symmetric Skin: Skin color, texture, turgor normal. No rashes or lesions Lymph nodes: Cervical, supraclavicular, and axillary nodes normal. Neurologic: Alert and oriented X 3, normal strength and tone. Normal symmetric reflexes. Normal coordination and gait    .Marland Kitchen  Depression screen [facility] 2/9 12/19/2020 07/10/2020 12/17/2019 08/04/2019 12/16/2018  Decreased Interest 0 0 0 0 0  Down, Depressed, Hopeless 0 0 0 0 0  PHQ - 2 Score 0 0 0 0 0  Altered sleeping - - - 0 0  Tired, decreased energy - - - 0 0  Change in appetite - - - 0 0  Feeling bad or failure about yourself  - - - 0 0  Trouble concentrating - - - 0 0  Moving slowly or fidgety/restless - - - 0 0  Suicidal thoughts - - - 0 0  PHQ-9 Score - - - 0 0  Difficult doing work/chores - - - Not difficult at all  Not difficult at all      Assessment:    Healthy female exam.    Plan:    Marland KitchenMarland KitchenRashidah was seen today for annual exam.  Diagnoses and all orders for this visit:  Annual physical exam  Healthcare maintenance -     CBC with Differential/Platelet -     COMPLETE METABOLIC PANEL WITH GFR  Essential hypertension -     CBC with Differential/Platelet -     COMPLETE METABOLIC PANEL WITH GFR -     losartan (COZAAR) 100 MG tablet; Take 1 tablet (100 mg total) by mouth daily. -     amLODipine (NORVASC) 2.5 MG tablet; Take 1 tablet (2.5 mg total) by mouth daily.  Mixed hyperlipidemia -     Lipid panel -     atorvastatin (LIPITOR) 20 MG tablet; Take 1 tablet (20 mg total) by mouth daily.  Screening for HIV (human immunodeficiency virus) -     HIV Antibody (routine testing w rflx)  Chronic pain syndrome -     DRUG MONITORING, PANEL 6 WITH CONFIRMATION, URINE -     oxyCODONE-acetaminophen (PERCOCET) 10-325 MG tablet; Take 1 tablet by mouth every 6 (six) hours as needed for pain. -     oxyCODONE-acetaminophen (PERCOCET) 10-325 MG tablet; Take 1 tablet by mouth every 6 (six) hours as needed for pain. -     oxyCODONE-acetaminophen (PERCOCET) 10-325 MG tablet; Take 1 tablet by mouth every 6 (six) hours as needed for pain.  DDD (degenerative disc disease), lumbar -     oxyCODONE-acetaminophen (PERCOCET) 10-325 MG tablet; Take 1 tablet by mouth every 6 (six) hours as needed for pain. -     oxyCODONE-acetaminophen (PERCOCET) 10-325 MG tablet; Take 1 tablet by mouth every 6 (six) hours as needed for pain. -     oxyCODONE-acetaminophen (PERCOCET) 10-325 MG tablet; Take 1 tablet by mouth every 6 (six) hours as needed for pain.  Age-related osteoporosis without current pathological fracture -     DG Bone Density; Future  .Marland Kitchen Discussed 150 minutes of exercise a week.  Encouraged vitamin D 1000 units and Calcium 1300mg  or 4 servings of dairy a day.  Fasting labs ordered today.  PHQ no concerns.   Mammogram and bone density ordered.  Colonoscopy to be scheduled.  Shingrix/pneumonia/flu UTD.  Declined covid vaccine.     Pain contract signed today.  UDS ordered.  Marland KitchenMarland KitchenPDMP reviewed during this encounter. No concerns.  Refilled for 3 months.  Follow up in 3 months via  controlled substance law.   BP up today but out of norvasc. Getting 130/80 readings at home.  2nd recheck it did come down some.   See After Visit Summary for Counseling Recommendations

## 2020-12-20 ENCOUNTER — Encounter: Payer: Self-pay | Admitting: Physician Assistant

## 2020-12-20 NOTE — Progress Notes (Signed)
Baker Janus,   RBC look big. Please add B12 and folate with ferritin and serum iron.  LDL and HDL look better than 1 year ago.  Fasting glucose above 100. Add a1c to get a better evaluation.

## 2020-12-21 LAB — DRUG MONITORING, PANEL 6 WITH CONFIRMATION, URINE
6 Acetylmorphine: NEGATIVE ng/mL (ref <10)
Alcohol Metabolites: NEGATIVE ng/mL (ref <500)
Amphetamines: NEGATIVE ng/mL (ref <500)
Barbiturates: NEGATIVE ng/mL (ref <300)
Benzodiazepines: NEGATIVE ng/mL (ref <100)
Cocaine Metabolite: NEGATIVE ng/mL (ref <150)
Codeine: NEGATIVE ng/mL (ref <50)
Creatinine: 124.7 mg/dL (ref >=20.0)
Hydrocodone: NEGATIVE ng/mL (ref <50)
Hydromorphone: NEGATIVE ng/mL (ref <50)
Marijuana Metabolite: NEGATIVE ng/mL (ref <20)
Methadone Metabolite: NEGATIVE ng/mL (ref <100)
Morphine: NEGATIVE ng/mL (ref <50)
Norhydrocodone: NEGATIVE ng/mL (ref <50)
Noroxycodone: 8543 ng/mL — ABNORMAL HIGH (ref <50)
Opiates: NEGATIVE ng/mL (ref <100)
Oxidant: NEGATIVE ug/mL (ref <200)
Oxycodone: 5901 ng/mL — ABNORMAL HIGH (ref <50)
Oxycodone: POSITIVE ng/mL — AB (ref <100)
Oxymorphone: 5349 ng/mL — ABNORMAL HIGH (ref <50)
Phencyclidine: NEGATIVE ng/mL (ref <25)
pH: 6.7 (ref 4.5–9.0)

## 2020-12-21 LAB — DM TEMPLATE

## 2020-12-22 LAB — LIPID PANEL
Cholesterol: 159 mg/dL (ref <200)
HDL: 51 mg/dL (ref >=50)
LDL Cholesterol (Calc): 85 mg/dL (calc)
Non-HDL Cholesterol (Calc): 108 mg/dL (calc) (ref <130)
Total CHOL/HDL Ratio: 3.1 (calc) (ref <5.0)
Triglycerides: 124 mg/dL (ref <150)

## 2020-12-22 LAB — COMPLETE METABOLIC PANEL WITH GFR
AG Ratio: 1.5 (calc) (ref 1.0–2.5)
ALT: 8 U/L (ref 6–29)
AST: 8 U/L — ABNORMAL LOW (ref 10–35)
Albumin: 3.8 g/dL (ref 3.6–5.1)
Alkaline phosphatase (APISO): 60 U/L (ref 37–153)
BUN: 11 mg/dL (ref 7–25)
CO2: 28 mmol/L (ref 20–32)
Calcium: 9.1 mg/dL (ref 8.6–10.4)
Chloride: 104 mmol/L (ref 98–110)
Creat: 0.88 mg/dL (ref 0.50–1.05)
Globulin: 2.6 g/dL (calc) (ref 1.9–3.7)
Glucose, Bld: 105 mg/dL — ABNORMAL HIGH (ref 65–99)
Potassium: 3.5 mmol/L (ref 3.5–5.3)
Sodium: 141 mmol/L (ref 135–146)
Total Bilirubin: 0.3 mg/dL (ref 0.2–1.2)
Total Protein: 6.4 g/dL (ref 6.1–8.1)
eGFR: 74 mL/min/{1.73_m2} (ref >=60)

## 2020-12-22 LAB — CBC WITH DIFFERENTIAL/PLATELET
Absolute Monocytes: 385 cells/uL (ref 200–950)
Basophils Absolute: 28 cells/uL (ref 0–200)
Basophils Relative: 0.3 %
Eosinophils Absolute: 28 cells/uL (ref 15–500)
Eosinophils Relative: 0.3 %
HCT: 38.5 % (ref 35.0–45.0)
Hemoglobin: 12.9 g/dL (ref 11.7–15.5)
Lymphs Abs: 320 cells/uL — ABNORMAL LOW (ref 850–3900)
MCH: 34.9 pg — ABNORMAL HIGH (ref 27.0–33.0)
MCHC: 33.5 g/dL (ref 32.0–36.0)
MCV: 104.1 fL — ABNORMAL HIGH (ref 80.0–100.0)
MPV: 10.6 fL (ref 7.5–12.5)
Monocytes Relative: 4.1 %
Neutro Abs: 8639 cells/uL — ABNORMAL HIGH (ref 1500–7800)
Neutrophils Relative %: 91.9 %
Platelets: 171 10*3/uL (ref 140–400)
RBC: 3.7 10*6/uL — ABNORMAL LOW (ref 3.80–5.10)
RDW: 11.3 % (ref 11.0–15.0)
Total Lymphocyte: 3.4 %
WBC: 9.4 10*3/uL (ref 3.8–10.8)

## 2020-12-22 LAB — HIV ANTIBODY (ROUTINE TESTING W REFLEX): HIV 1&2 Ab, 4th Generation: NONREACTIVE

## 2020-12-22 LAB — IRON,TIBC AND FERRITIN PANEL
%SAT: 17 % (calc) (ref 16–45)
Ferritin: 100 ng/mL (ref 16–288)
Iron: 51 ug/dL (ref 45–160)
TIBC: 293 mcg/dL (calc) (ref 250–450)

## 2020-12-22 LAB — HEMOGLOBIN A1C W/OUT EAG: Hgb A1c MFr Bld: 5 % of total Hgb (ref <5.7)

## 2020-12-22 LAB — VITAMIN B12: Vitamin B-12: 409 pg/mL (ref 200–1100)

## 2020-12-22 LAB — FOLATE: Folate: 4.2 ng/mL — ABNORMAL LOW

## 2020-12-22 NOTE — Progress Notes (Signed)
B12 looks good.  A1C looks great.  Folate is low. Make sure taking extra folic acid at least 735HGD a day.

## 2020-12-27 DIAGNOSIS — F17210 Nicotine dependence, cigarettes, uncomplicated: Secondary | ICD-10-CM | POA: Diagnosis not present

## 2020-12-27 DIAGNOSIS — R911 Solitary pulmonary nodule: Secondary | ICD-10-CM | POA: Diagnosis not present

## 2020-12-27 DIAGNOSIS — J449 Chronic obstructive pulmonary disease, unspecified: Secondary | ICD-10-CM | POA: Diagnosis not present

## 2021-01-05 DIAGNOSIS — J449 Chronic obstructive pulmonary disease, unspecified: Secondary | ICD-10-CM | POA: Diagnosis not present

## 2021-01-09 DIAGNOSIS — C210 Malignant neoplasm of anus, unspecified: Secondary | ICD-10-CM | POA: Diagnosis not present

## 2021-01-10 DIAGNOSIS — Z90710 Acquired absence of both cervix and uterus: Secondary | ICD-10-CM | POA: Diagnosis not present

## 2021-01-10 DIAGNOSIS — T451X5A Adverse effect of antineoplastic and immunosuppressive drugs, initial encounter: Secondary | ICD-10-CM | POA: Diagnosis not present

## 2021-01-10 DIAGNOSIS — Z95828 Presence of other vascular implants and grafts: Secondary | ICD-10-CM | POA: Diagnosis not present

## 2021-01-10 DIAGNOSIS — J449 Chronic obstructive pulmonary disease, unspecified: Secondary | ICD-10-CM | POA: Diagnosis not present

## 2021-01-10 DIAGNOSIS — Z79899 Other long term (current) drug therapy: Secondary | ICD-10-CM | POA: Diagnosis not present

## 2021-01-10 DIAGNOSIS — I10 Essential (primary) hypertension: Secondary | ICD-10-CM | POA: Diagnosis not present

## 2021-01-10 DIAGNOSIS — C210 Malignant neoplasm of anus, unspecified: Secondary | ICD-10-CM | POA: Diagnosis not present

## 2021-01-10 DIAGNOSIS — F17200 Nicotine dependence, unspecified, uncomplicated: Secondary | ICD-10-CM | POA: Diagnosis not present

## 2021-01-10 DIAGNOSIS — Z7983 Long term (current) use of bisphosphonates: Secondary | ICD-10-CM | POA: Diagnosis not present

## 2021-01-10 DIAGNOSIS — Z7982 Long term (current) use of aspirin: Secondary | ICD-10-CM | POA: Diagnosis not present

## 2021-01-10 DIAGNOSIS — Z85828 Personal history of other malignant neoplasm of skin: Secondary | ICD-10-CM | POA: Diagnosis not present

## 2021-01-10 DIAGNOSIS — D6959 Other secondary thrombocytopenia: Secondary | ICD-10-CM | POA: Diagnosis not present

## 2021-01-10 DIAGNOSIS — Z90722 Acquired absence of ovaries, bilateral: Secondary | ICD-10-CM | POA: Diagnosis not present

## 2021-01-10 DIAGNOSIS — F17210 Nicotine dependence, cigarettes, uncomplicated: Secondary | ICD-10-CM | POA: Diagnosis not present

## 2021-01-17 ENCOUNTER — Ambulatory Visit (INDEPENDENT_AMBULATORY_CARE_PROVIDER_SITE_OTHER): Payer: BC Managed Care – PPO

## 2021-01-17 ENCOUNTER — Other Ambulatory Visit: Payer: Self-pay

## 2021-01-17 DIAGNOSIS — M810 Age-related osteoporosis without current pathological fracture: Secondary | ICD-10-CM | POA: Diagnosis not present

## 2021-01-17 DIAGNOSIS — Z1231 Encounter for screening mammogram for malignant neoplasm of breast: Secondary | ICD-10-CM

## 2021-01-17 DIAGNOSIS — Z780 Asymptomatic menopausal state: Secondary | ICD-10-CM | POA: Diagnosis not present

## 2021-01-19 NOTE — Progress Notes (Signed)
T score was stable at -2.6 from 2 years ago. Repeat in 2 years.

## 2021-01-19 NOTE — Progress Notes (Signed)
Normal mammogram. Follow up in 1 year.

## 2021-02-01 DIAGNOSIS — H2513 Age-related nuclear cataract, bilateral: Secondary | ICD-10-CM | POA: Diagnosis not present

## 2021-02-01 DIAGNOSIS — H5203 Hypermetropia, bilateral: Secondary | ICD-10-CM | POA: Diagnosis not present

## 2021-02-05 DIAGNOSIS — R069 Unspecified abnormalities of breathing: Secondary | ICD-10-CM | POA: Diagnosis not present

## 2021-02-05 DIAGNOSIS — J449 Chronic obstructive pulmonary disease, unspecified: Secondary | ICD-10-CM | POA: Diagnosis not present

## 2021-02-05 DIAGNOSIS — R0689 Other abnormalities of breathing: Secondary | ICD-10-CM | POA: Diagnosis not present

## 2021-02-05 DIAGNOSIS — R0902 Hypoxemia: Secondary | ICD-10-CM | POA: Diagnosis not present

## 2021-02-05 DIAGNOSIS — R000 Tachycardia, unspecified: Secondary | ICD-10-CM | POA: Diagnosis not present

## 2021-03-08 DIAGNOSIS — J449 Chronic obstructive pulmonary disease, unspecified: Secondary | ICD-10-CM | POA: Diagnosis not present

## 2021-03-13 DIAGNOSIS — R918 Other nonspecific abnormal finding of lung field: Secondary | ICD-10-CM | POA: Diagnosis not present

## 2021-03-13 DIAGNOSIS — J449 Chronic obstructive pulmonary disease, unspecified: Secondary | ICD-10-CM | POA: Diagnosis not present

## 2021-03-13 DIAGNOSIS — C210 Malignant neoplasm of anus, unspecified: Secondary | ICD-10-CM | POA: Diagnosis not present

## 2021-03-20 ENCOUNTER — Ambulatory Visit (INDEPENDENT_AMBULATORY_CARE_PROVIDER_SITE_OTHER): Payer: BC Managed Care – PPO | Admitting: Physician Assistant

## 2021-03-20 ENCOUNTER — Other Ambulatory Visit: Payer: Self-pay

## 2021-03-20 ENCOUNTER — Encounter: Payer: Self-pay | Admitting: Physician Assistant

## 2021-03-20 VITALS — BP 157/69 | HR 90 | Ht 67.0 in | Wt 123.0 lb

## 2021-03-20 DIAGNOSIS — G894 Chronic pain syndrome: Secondary | ICD-10-CM

## 2021-03-20 DIAGNOSIS — R300 Dysuria: Secondary | ICD-10-CM | POA: Diagnosis not present

## 2021-03-20 DIAGNOSIS — R062 Wheezing: Secondary | ICD-10-CM

## 2021-03-20 DIAGNOSIS — N3001 Acute cystitis with hematuria: Secondary | ICD-10-CM | POA: Diagnosis not present

## 2021-03-20 DIAGNOSIS — J441 Chronic obstructive pulmonary disease with (acute) exacerbation: Secondary | ICD-10-CM | POA: Diagnosis not present

## 2021-03-20 DIAGNOSIS — I10 Essential (primary) hypertension: Secondary | ICD-10-CM

## 2021-03-20 DIAGNOSIS — R1030 Lower abdominal pain, unspecified: Secondary | ICD-10-CM | POA: Diagnosis not present

## 2021-03-20 DIAGNOSIS — M5136 Other intervertebral disc degeneration, lumbar region: Secondary | ICD-10-CM

## 2021-03-20 LAB — POCT URINALYSIS DIP (CLINITEK)
Bilirubin, UA: NEGATIVE
Glucose, UA: NEGATIVE mg/dL
Ketones, POC UA: NEGATIVE mg/dL
Nitrite, UA: NEGATIVE
POC PROTEIN,UA: NEGATIVE
Spec Grav, UA: 1.02 (ref 1.010–1.025)
Urobilinogen, UA: 0.2 E.U./dL
pH, UA: 7 (ref 5.0–8.0)

## 2021-03-20 MED ORDER — CIPROFLOXACIN HCL 500 MG PO TABS
500.0000 mg | ORAL_TABLET | Freq: Two times a day (BID) | ORAL | 0 refills | Status: AC
Start: 2021-03-20 — End: 2021-03-23

## 2021-03-20 MED ORDER — OXYCODONE-ACETAMINOPHEN 10-325 MG PO TABS: 1.0000 | ORAL_TABLET | Freq: Four times a day (QID) | ORAL | 0 refills | Status: DC | PRN

## 2021-03-20 MED ORDER — MONTELUKAST SODIUM 10 MG PO TABS: 10.0000 mg | ORAL_TABLET | Freq: Every day | ORAL | 3 refills | Status: DC

## 2021-03-20 MED ORDER — METHYLPREDNISOLONE SODIUM SUCC 125 MG IJ SOLR: 125.0000 mg | Freq: Once | INTRAMUSCULAR | 0 refills | Status: DC

## 2021-03-20 MED ORDER — LOSARTAN POTASSIUM 100 MG PO TABS: 100.0000 mg | ORAL_TABLET | Freq: Every day | ORAL | 1 refills | Status: DC

## 2021-03-20 MED ORDER — METHYLPREDNISOLONE SODIUM SUCC 125 MG IJ SOLR
125.0000 mg | Freq: Once | INTRAMUSCULAR | Status: AC
  Administered 2021-03-20: 125 mg via INTRAMUSCULAR

## 2021-03-20 NOTE — Progress Notes (Signed)
? ?Subjective:  ? ? Patient ID: Sue Smith, female    DOB: 11-Jan-1956, 66 y.o.   MRN: [id number] ? ?HPI ?Pt is a 66 yo female who presents to the clinic with dysuria, urinary retention, lower abdominal discomfort for the last 2 weeks. She had a few days of diarrhea before symptoms started. She denies any fever, chill, flank pain, nausea, vomiting. She has not tried anything to make better.  ? ?She is in remission for anal cancer.  ? ?She is having some trouble breathing. She has COPD and on daily prednisone. She thinks its the pollen. She is also out of her singulair. No productive cough, fever, chills. She has O2 at home.  ? ?Pt is on pain contract and needs 3 month refills.  ? ?.. ?Active Ambulatory Problems  ?  Diagnosis Date Noted  ? Essential hypertension 11/04/2011  ? Surgical menopause 11/04/2011  ? Vasomotor flushing 11/18/2011  ? Pre-diabetes 11/20/2011  ? Personal history of colonic polyps 03/26/2012  ? Osteoporosis 11/25/2012  ? Hyperlipidemia 11/25/2012  ? DDD (degenerative disc disease), lumbar 03/23/2013  ? Insomnia 11/25/2013  ? COPD (chronic obstructive pulmonary disease) with chronic bronchitis ([place]) 02/16/2015  ? Simple chronic bronchitis ([place]) 08/27/2016  ? Actinic keratosis 11/29/2016  ? Chronic pain syndrome 11/29/2016  ? Gastroesophageal reflux disease 11/29/2016  ? Current smoker 02/26/2017  ? Grief 06/30/2017  ? No energy 07/29/2018  ? Irregular heart rhythm 05/06/2019  ? PAC (premature atrial contraction) 05/06/2019  ? PVC (premature ventricular contraction) 05/06/2019  ? Atrial fibrillation with RVR ([place]) 11/16/2019  ? COVID-19 virus infection 11/16/2019  ? Anal lesion 07/10/2020  ? Anal cancer ([place]) 10/18/2020  ? ?Resolved Ambulatory Problems  ?  Diagnosis Date Noted  ? Post-viral cough syndrome 11/16/2019  ? ?Past Medical History:  ?Diagnosis Date  ? Cervical dysplasia   ? COPD (chronic obstructive pulmonary disease) ([place])   ? Hypertension   ? ? ? ? ? ?Review of Systems ? ?  See  HPI.  ?Objective:  ? Physical Exam ?Vitals reviewed.  ?Constitutional:   ?   Appearance: Normal appearance. She is obese.  ?HENT:  ?   Head: Normocephalic.  ?   Right Ear: Tympanic membrane normal.  ?   Left Ear: Tympanic membrane normal.  ?Cardiovascular:  ?   Rate and Rhythm: Normal rate and regular rhythm.  ?   Pulses: Normal pulses.  ?   Heart sounds: Normal heart sounds.  ?Pulmonary:  ?   Effort: Pulmonary effort is normal.  ?   Breath sounds: Normal breath sounds.  ?   Comments: Diminished breath sounds with some expiration wheezing.  ?Abdominal:  ?   General: There is no distension.  ?   Palpations: Abdomen is soft. There is no mass.  ?   Tenderness: There is abdominal tenderness. There is no right CVA tenderness, left CVA tenderness, guarding or rebound.  ?   Hernia: No hernia is present.  ?   Comments: Suprapubic tenderness  ?Musculoskeletal:     ?   General: Normal range of motion.  ?   Cervical back: Normal range of motion and neck supple.  ?Neurological:  ?   General: No focal deficit present.  ?   Mental Status: She is alert and oriented to person, place, and time.  ?Psychiatric:     ?   Mood and Affect: Mood normal.     ?   Behavior: Behavior normal.  ?.. ?Results for orders placed or performed in  visit on 03/20/21  ?POCT URINALYSIS DIP (CLINITEK)  ?Result Value Ref Range  ? Color, UA yellow yellow  ? Clarity, UA clear clear  ? Glucose, UA negative negative mg/dL  ? Bilirubin, UA negative negative  ? Ketones, POC UA negative negative mg/dL  ? Spec Grav, UA 1.020 1.010 - 1.025  ? Blood, UA small (A) negative  ? pH, UA 7.0 5.0 - 8.0  ? POC PROTEIN,UA negative negative, trace  ? Urobilinogen, UA 0.2 0.2 or 1.0 E.U./dL  ? Nitrite, UA Negative Negative  ? Leukocytes, UA Trace (A) Negative  ? ? ? ? ? ? ?   ?Assessment & Plan:  ?..Sue Smith was seen today for dysuria. ? ?Diagnoses and all orders for this visit: ? ?Acute cystitis with hematuria ?-     ciprofloxacin (CIPRO) 500 MG tablet; Take 1 tablet (500 mg  total) by mouth 2 (two) times daily for 3 days. ? ?Dysuria ?-     POCT URINALYSIS DIP (CLINITEK) ?-     Urine Culture ? ?Lower abdominal pain ?-     POCT URINALYSIS DIP (CLINITEK) ?-     Urine Culture ? ?Essential hypertension ?-     losartan (COZAAR) 100 MG tablet; Take 1 tablet (100 mg total) by mouth daily. ? ?COPD exacerbation ([place]) ?-     montelukast (SINGULAIR) 10 MG tablet; Take 1 tablet (10 mg total) by mouth at bedtime. ?-     Discontinue: methylPREDNISolone sodium succinate (SOLU-MEDROL) 125 mg/2 mL injection; Inject 2 mLs (125 mg total) into the muscle once for 1 dose. ?-     methylPREDNISolone sodium succinate (SOLU-MEDROL) 125 mg/2 mL injection 125 mg ? ?Wheezing ?-     montelukast (SINGULAIR) 10 MG tablet; Take 1 tablet (10 mg total) by mouth at bedtime. ?-     Discontinue: methylPREDNISolone sodium succinate (SOLU-MEDROL) 125 mg/2 mL injection; Inject 2 mLs (125 mg total) into the muscle once for 1 dose. ?-     methylPREDNISolone sodium succinate (SOLU-MEDROL) 125 mg/2 mL injection 125 mg ? ?Chronic pain syndrome ?-     oxyCODONE-acetaminophen (PERCOCET) 10-325 MG tablet; Take 1 tablet by mouth every 6 (six) hours as needed for pain. ?-     oxyCODONE-acetaminophen (PERCOCET) 10-325 MG tablet; Take 1 tablet by mouth every 6 (six) hours as needed for pain. ?-     oxyCODONE-acetaminophen (PERCOCET) 10-325 MG tablet; Take 1 tablet by mouth every 6 (six) hours as needed for pain. ? ?DDD (degenerative disc disease), lumbar ?-     oxyCODONE-acetaminophen (PERCOCET) 10-325 MG tablet; Take 1 tablet by mouth every 6 (six) hours as needed for pain. ?-     oxyCODONE-acetaminophen (PERCOCET) 10-325 MG tablet; Take 1 tablet by mouth every 6 (six) hours as needed for pain. ?-     oxyCODONE-acetaminophen (PERCOCET) 10-325 MG tablet; Take 1 tablet by mouth every 6 (six) hours as needed for pain. ? ?UA positive for leuks/blood ?Will culture ?Start uncomplicated cipro treatment for 3 days ? ?COPD exacerbation pulse ox  97 percent solumedrol given IM today ?Added back in singulair ?Continue to use nebulizers regularly ?Follow up as needed ? ?Oxycodone refilled for 3 months ?Pain contract UTD ?Marland Kitchen.PDMP reviewed during this encounter. ?3 month follow up per [place] law. ? ?

## 2021-03-22 ENCOUNTER — Encounter: Payer: Self-pay | Admitting: Physician Assistant

## 2021-03-22 LAB — URINE CULTURE
MICRO NUMBER:: 13101306
Result:: NO GROWTH
SPECIMEN QUALITY:: ADEQUATE

## 2021-03-22 NOTE — Progress Notes (Signed)
No bacteria growth in urine culture. Stop antibiotic. Are you still having symptoms?

## 2021-03-25 DIAGNOSIS — K5900 Constipation, unspecified: Secondary | ICD-10-CM | POA: Diagnosis not present

## 2021-03-25 DIAGNOSIS — Z85828 Personal history of other malignant neoplasm of skin: Secondary | ICD-10-CM | POA: Diagnosis not present

## 2021-03-25 DIAGNOSIS — D72829 Elevated white blood cell count, unspecified: Secondary | ICD-10-CM | POA: Diagnosis not present

## 2021-03-25 DIAGNOSIS — F17210 Nicotine dependence, cigarettes, uncomplicated: Secondary | ICD-10-CM | POA: Diagnosis not present

## 2021-03-25 DIAGNOSIS — K760 Fatty (change of) liver, not elsewhere classified: Secondary | ICD-10-CM | POA: Diagnosis not present

## 2021-03-25 DIAGNOSIS — K449 Diaphragmatic hernia without obstruction or gangrene: Secondary | ICD-10-CM | POA: Diagnosis not present

## 2021-03-25 DIAGNOSIS — J449 Chronic obstructive pulmonary disease, unspecified: Secondary | ICD-10-CM | POA: Diagnosis not present

## 2021-03-25 DIAGNOSIS — Z881 Allergy status to other antibiotic agents status: Secondary | ICD-10-CM | POA: Diagnosis not present

## 2021-03-25 DIAGNOSIS — Z90710 Acquired absence of both cervix and uterus: Secondary | ICD-10-CM | POA: Diagnosis not present

## 2021-03-25 DIAGNOSIS — Z79899 Other long term (current) drug therapy: Secondary | ICD-10-CM | POA: Diagnosis not present

## 2021-03-25 DIAGNOSIS — K529 Noninfective gastroenteritis and colitis, unspecified: Secondary | ICD-10-CM | POA: Diagnosis not present

## 2021-03-25 DIAGNOSIS — K838 Other specified diseases of biliary tract: Secondary | ICD-10-CM | POA: Diagnosis not present

## 2021-03-25 DIAGNOSIS — Z882 Allergy status to sulfonamides status: Secondary | ICD-10-CM | POA: Diagnosis not present

## 2021-03-25 DIAGNOSIS — Z880 Allergy status to penicillin: Secondary | ICD-10-CM | POA: Diagnosis not present

## 2021-03-25 DIAGNOSIS — R300 Dysuria: Secondary | ICD-10-CM | POA: Diagnosis not present

## 2021-03-25 DIAGNOSIS — I10 Essential (primary) hypertension: Secondary | ICD-10-CM | POA: Diagnosis not present

## 2021-03-25 DIAGNOSIS — Z7982 Long term (current) use of aspirin: Secondary | ICD-10-CM | POA: Diagnosis not present

## 2021-03-25 DIAGNOSIS — Z7952 Long term (current) use of systemic steroids: Secondary | ICD-10-CM | POA: Diagnosis not present

## 2021-03-26 ENCOUNTER — Encounter: Payer: Self-pay | Admitting: Physician Assistant

## 2021-03-26 ENCOUNTER — Telehealth (INDEPENDENT_AMBULATORY_CARE_PROVIDER_SITE_OTHER): Payer: BC Managed Care – PPO | Admitting: Physician Assistant

## 2021-03-26 ENCOUNTER — Other Ambulatory Visit: Payer: Self-pay

## 2021-03-26 DIAGNOSIS — K529 Noninfective gastroenteritis and colitis, unspecified: Secondary | ICD-10-CM | POA: Diagnosis not present

## 2021-03-26 DIAGNOSIS — K5903 Drug induced constipation: Secondary | ICD-10-CM | POA: Diagnosis not present

## 2021-03-26 DIAGNOSIS — Z85048 Personal history of other malignant neoplasm of rectum, rectosigmoid junction, and anus: Secondary | ICD-10-CM

## 2021-03-26 DIAGNOSIS — K838 Other specified diseases of biliary tract: Secondary | ICD-10-CM | POA: Diagnosis not present

## 2021-03-26 MED ORDER — CIPROFLOXACIN HCL 500 MG PO TABS: 500.0000 mg | ORAL_TABLET | Freq: Two times a day (BID) | ORAL | 0 refills | Status: AC

## 2021-03-26 MED ORDER — METRONIDAZOLE 500 MG PO TABS: 500.0000 mg | ORAL_TABLET | Freq: Three times a day (TID) | ORAL | 0 refills | Status: AC

## 2021-03-26 NOTE — Progress Notes (Signed)
From mychart message: ? ?Good Morning!  ?I have been having some stomach issues and ended up with a ER visit yesterday afternoon. I am not comfortable with the information that they gave me and sent me home with. They suggested that I follow up with you today, but I feel that I may need to go see a Gastrologist. I am hoping that maybe you can possibly get me in quicker to one rather that me calling myself. I am wanting to go to the [facility] in [place], not [facility]. Can you help me with this?  ?You should be able to see my results from my visit yesterday as well and see what they sent me home with compared to the imaging and blood results.  ?  ? ?

## 2021-03-26 NOTE — Telephone Encounter (Signed)
Virtual appt made with patient to discuss.  ?

## 2021-03-26 NOTE — Telephone Encounter (Signed)
Patient sent a different message, called and made virtual visit to discuss.  ?

## 2021-03-26 NOTE — Progress Notes (Signed)
..Virtual Visit via Telephone Note ? ?I connected with Sue Smith on 03/26/21 at 11:10 AM EDT by telephone and verified that I am speaking with the correct person using two identifiers. ? ?Location: ?Patient: home ?Provider: clinic ? ?Marland Kitchen.Participating in visit:  ?Patient: Sue Smith ?Provider: Iran Planas PA-C ?Provider in training: Leona Singleton PA-S ?  ?I discussed the limitations, risks, security and privacy concerns of performing an evaluation and management service by telephone and the availability of in person appointments. I also discussed with the patient that there may be a patient responsible charge related to this service. The patient expressed understanding and agreed to proceed. ? ? ?History of Present Illness: ?Pt is a 66 yo female who calls in to follow up after ED visit for lower abdominal pain. Pt has anal cancer in remission and reports lower abdominal pain for 5 days with diarrhea and dark stools.  ? ?Went to ED on 03/25/2021. No blood in stool detected.  ?WBC 26.5 but she is on steroids.  ?HgB 15.2 ?CT showed colitis.  ?Given immodium and symptomatic care.  ? ? ? ?.. ?Active Ambulatory Problems  ?  Diagnosis Date Noted  ? Essential hypertension 11/04/2011  ? Surgical menopause 11/04/2011  ? Vasomotor flushing 11/18/2011  ? Pre-diabetes 11/20/2011  ? Personal history of colonic polyps 03/26/2012  ? Osteoporosis 11/25/2012  ? Hyperlipidemia 11/25/2012  ? DDD (degenerative disc disease), lumbar 03/23/2013  ? Insomnia 11/25/2013  ? COPD (chronic obstructive pulmonary disease) with chronic bronchitis ([place]) 02/16/2015  ? Simple chronic bronchitis ([place]) 08/27/2016  ? Actinic keratosis 11/29/2016  ? Chronic pain syndrome 11/29/2016  ? Gastroesophageal reflux disease 11/29/2016  ? Current smoker 02/26/2017  ? Grief 06/30/2017  ? No energy 07/29/2018  ? Irregular heart rhythm 05/06/2019  ? PAC (premature atrial contraction) 05/06/2019  ? PVC (premature ventricular contraction) 05/06/2019  ? Atrial  fibrillation with RVR ([place]) 11/16/2019  ? COVID-19 virus infection 11/16/2019  ? Anal lesion 07/10/2020  ? Anal cancer ([place]) 10/18/2020  ? Drug-induced constipation 03/26/2021  ? Common bile duct dilation 03/26/2021  ? History of rectal or anal cancer 03/26/2021  ? Colitis 03/26/2021  ? ?Resolved Ambulatory Problems  ?  Diagnosis Date Noted  ? Post-viral cough syndrome 11/16/2019  ? ?Past Medical History:  ?Diagnosis Date  ? Cervical dysplasia   ? COPD (chronic obstructive pulmonary disease) ([place])   ? Hypertension   ? ? ? ?  ?Observations/Objective: ?No acute distress ?Ongoing lower abdominal pain worse over left quadrant ? ?FINDINGS: Lung bases: Large hiatal hernia. ? ?Abdomen/pelvis: Colitis of the splenic flexure through the rectum. Moderate constipation. Appendix not identified. No inflammatory change adjacent to the cecum. Spleen, bilateral adrenal glands, and pancreas are unremarkable. Gallbladder present. Extrahepatic common bile duct measures 10 mm which is enlarged. No intrahepatic bile duct dilatation. Fatty liver. Liver cyst. No adenopathy. Severe degenerative change lumbar spine ? ?Impression:  ?IMPRESSION: ?1. Colitis of the distal colon. ?2. Enlarged common bile duct. ?3. Fatty liver. ?4. Hiatal hernia ? ? ?Assessment and Plan: ? ?Marland KitchenMarland KitchenPromyse was seen today for diarrhea. ? ?Diagnoses and all orders for this visit: ? ?Colitis ?-     Ambulatory referral to Gastroenterology ?-     ciprofloxacin (CIPRO) 500 MG tablet; Take 1 tablet (500 mg total) by mouth 2 (two) times daily for 10 days. ?-     metroNIDAZOLE (FLAGYL) 500 MG tablet; Take 1 tablet (500 mg total) by mouth 3 (three) times daily for 10 days. ? ?History of rectal or  anal cancer ?-     Ambulatory referral to Gastroenterology ? ?Common bile duct dilation ?-     Ambulatory referral to Gastroenterology ? ?Drug-induced constipation ? ? ? ? ?Reviewed CT results. ?Start cipro and metronidazole for colitis. ?Stop immodium due to constipation. ?Urgent  referral made for common bile duct enlargement and colitis ?Follow up as needed or if symptoms worsen ? ? ?Follow Up Instructions: ? ?  ?I discussed the assessment and treatment plan with the patient. The patient was provided an opportunity to ask questions and all were answered. The patient agreed with the plan and demonstrated an understanding of the instructions. ?  ?The patient was advised to call back or seek an in-person evaluation if the symptoms worsen or if the condition fails to improve as anticipated. ? ?I provided 10 minutes of non-face-to-face time during this encounter. ? ? ?Iran Planas, PA-C ? ?

## 2021-03-27 DIAGNOSIS — Z608 Other problems related to social environment: Secondary | ICD-10-CM | POA: Diagnosis not present

## 2021-03-27 DIAGNOSIS — N171 Acute kidney failure with acute cortical necrosis: Secondary | ICD-10-CM | POA: Diagnosis not present

## 2021-03-27 DIAGNOSIS — A419 Sepsis, unspecified organism: Secondary | ICD-10-CM | POA: Diagnosis not present

## 2021-03-27 DIAGNOSIS — J9611 Chronic respiratory failure with hypoxia: Secondary | ICD-10-CM | POA: Diagnosis not present

## 2021-03-27 DIAGNOSIS — I10 Essential (primary) hypertension: Secondary | ICD-10-CM | POA: Diagnosis not present

## 2021-03-27 DIAGNOSIS — K6389 Other specified diseases of intestine: Secondary | ICD-10-CM | POA: Diagnosis not present

## 2021-03-27 DIAGNOSIS — J441 Chronic obstructive pulmonary disease with (acute) exacerbation: Secondary | ICD-10-CM | POA: Diagnosis not present

## 2021-03-27 DIAGNOSIS — E871 Hypo-osmolality and hyponatremia: Secondary | ICD-10-CM | POA: Diagnosis not present

## 2021-03-27 DIAGNOSIS — J449 Chronic obstructive pulmonary disease, unspecified: Secondary | ICD-10-CM | POA: Diagnosis not present

## 2021-03-27 DIAGNOSIS — E2749 Other adrenocortical insufficiency: Secondary | ICD-10-CM | POA: Diagnosis not present

## 2021-03-27 DIAGNOSIS — F17210 Nicotine dependence, cigarettes, uncomplicated: Secondary | ICD-10-CM | POA: Diagnosis not present

## 2021-03-27 DIAGNOSIS — E2740 Unspecified adrenocortical insufficiency: Secondary | ICD-10-CM | POA: Diagnosis not present

## 2021-03-27 DIAGNOSIS — K449 Diaphragmatic hernia without obstruction or gangrene: Secondary | ICD-10-CM | POA: Diagnosis not present

## 2021-03-27 DIAGNOSIS — R6520 Severe sepsis without septic shock: Secondary | ICD-10-CM | POA: Diagnosis not present

## 2021-03-27 DIAGNOSIS — K7689 Other specified diseases of liver: Secondary | ICD-10-CM | POA: Diagnosis not present

## 2021-03-27 DIAGNOSIS — K6289 Other specified diseases of anus and rectum: Secondary | ICD-10-CM | POA: Diagnosis not present

## 2021-03-27 DIAGNOSIS — K219 Gastro-esophageal reflux disease without esophagitis: Secondary | ICD-10-CM | POA: Diagnosis not present

## 2021-03-27 DIAGNOSIS — A0472 Enterocolitis due to Clostridium difficile, not specified as recurrent: Secondary | ICD-10-CM | POA: Diagnosis not present

## 2021-03-27 DIAGNOSIS — K529 Noninfective gastroenteritis and colitis, unspecified: Secondary | ICD-10-CM | POA: Diagnosis not present

## 2021-03-27 DIAGNOSIS — E782 Mixed hyperlipidemia: Secondary | ICD-10-CM | POA: Diagnosis not present

## 2021-03-27 DIAGNOSIS — Z90710 Acquired absence of both cervix and uterus: Secondary | ICD-10-CM | POA: Diagnosis not present

## 2021-03-27 DIAGNOSIS — R112 Nausea with vomiting, unspecified: Secondary | ICD-10-CM | POA: Diagnosis not present

## 2021-03-27 DIAGNOSIS — R42 Dizziness and giddiness: Secondary | ICD-10-CM | POA: Diagnosis not present

## 2021-03-27 DIAGNOSIS — N179 Acute kidney failure, unspecified: Secondary | ICD-10-CM | POA: Diagnosis not present

## 2021-03-27 DIAGNOSIS — Z85828 Personal history of other malignant neoplasm of skin: Secondary | ICD-10-CM | POA: Diagnosis not present

## 2021-03-27 DIAGNOSIS — Z90722 Acquired absence of ovaries, bilateral: Secondary | ICD-10-CM | POA: Diagnosis not present

## 2021-04-09 ENCOUNTER — Encounter: Payer: Self-pay | Admitting: Physician Assistant

## 2021-04-12 DIAGNOSIS — K219 Gastro-esophageal reflux disease without esophagitis: Secondary | ICD-10-CM | POA: Diagnosis not present

## 2021-04-12 DIAGNOSIS — A0472 Enterocolitis due to Clostridium difficile, not specified as recurrent: Secondary | ICD-10-CM | POA: Diagnosis not present

## 2021-04-12 DIAGNOSIS — N179 Acute kidney failure, unspecified: Secondary | ICD-10-CM | POA: Diagnosis not present

## 2021-04-12 DIAGNOSIS — I10 Essential (primary) hypertension: Secondary | ICD-10-CM | POA: Diagnosis not present

## 2021-04-13 DIAGNOSIS — A0472 Enterocolitis due to Clostridium difficile, not specified as recurrent: Secondary | ICD-10-CM | POA: Diagnosis not present

## 2021-04-25 DIAGNOSIS — Z85048 Personal history of other malignant neoplasm of rectum, rectosigmoid junction, and anus: Secondary | ICD-10-CM | POA: Diagnosis not present

## 2021-04-25 DIAGNOSIS — A0472 Enterocolitis due to Clostridium difficile, not specified as recurrent: Secondary | ICD-10-CM | POA: Diagnosis not present

## 2021-04-25 DIAGNOSIS — K219 Gastro-esophageal reflux disease without esophagitis: Secondary | ICD-10-CM | POA: Diagnosis not present

## 2021-04-25 DIAGNOSIS — I10 Essential (primary) hypertension: Secondary | ICD-10-CM | POA: Diagnosis not present

## 2021-04-30 ENCOUNTER — Ambulatory Visit (INDEPENDENT_AMBULATORY_CARE_PROVIDER_SITE_OTHER): Payer: BC Managed Care – PPO | Admitting: Physician Assistant

## 2021-04-30 ENCOUNTER — Encounter: Payer: Self-pay | Admitting: Physician Assistant

## 2021-04-30 VITALS — BP 155/85 | HR 108 | Ht 67.0 in | Wt 111.0 lb

## 2021-04-30 DIAGNOSIS — M5136 Other intervertebral disc degeneration, lumbar region: Secondary | ICD-10-CM

## 2021-04-30 DIAGNOSIS — D72829 Elevated white blood cell count, unspecified: Secondary | ICD-10-CM

## 2021-04-30 DIAGNOSIS — R634 Abnormal weight loss: Secondary | ICD-10-CM | POA: Diagnosis not present

## 2021-04-30 DIAGNOSIS — A0472 Enterocolitis due to Clostridium difficile, not specified as recurrent: Secondary | ICD-10-CM | POA: Insufficient documentation

## 2021-04-30 DIAGNOSIS — G894 Chronic pain syndrome: Secondary | ICD-10-CM

## 2021-04-30 NOTE — Progress Notes (Signed)
? ?Subjective:  ? ? Patient ID: Sue Smith, female    DOB: 1955/12/21, 66 y.o.   MRN: [id number] ? ?HPI ?Pt is a 66 yo female with recent history of C.diff colitis and Hospital Admission from 03/27/2021 to 04/05/2021. Pt is being managed by dr. Danie Binder at [facility]. She lost 30lbs over the past 2 months. Her breathing has worsened some and has been staying on 2L of O2.  ? ?Labs in hospital showed WBC initially in the 30,000 but decrease to 17000 on discharge.  ? ?She has followed up with [facility] on 04/25/2021 and this was there plan.  ? ?Plan:  ?1 - Continue on Pantoprazole 40 mg bid for at least one month and then may be able to decrease to once a day. Recommended continuing on probiotics as well. New Rx sent in for patient.  ?2 - Advised patient to avoid antibiotics in the future but if she does need antibiotics, needs to take prophylactic vancomycin with antibiotic, for duration of antibiotic plus additional 7 days.  ?Recommended patient have colonoscopy after her follow-up with Colorectal surgeon. Will plan for this in 4-6 weeks with Dr. Danie Binder as long as patient doing well and ok with Colorectal surgery. Patient is agreeable to this.  ? ? ? ? ?Review of Systems ?See HPI.  ?   ?Objective:  ? Physical Exam ?Vitals reviewed.  ?Constitutional:   ?   Comments: Frail appearance  ?HENT:  ?   Head: Normocephalic.  ?Cardiovascular:  ?   Rate and Rhythm: Regular rhythm. Tachycardia present.  ?   Pulses: Normal pulses.  ?Pulmonary:  ?   Effort: Pulmonary effort is normal.  ?   Comments: 2L of O2 ?Coarse breath sounds ?Abdominal:  ?   General: Bowel sounds are normal. There is no distension.  ?   Palpations: Abdomen is soft. There is no mass.  ?   Tenderness: There is no abdominal tenderness. There is no right CVA tenderness, left CVA tenderness, guarding or rebound.  ?   Hernia: No hernia is present.  ?Musculoskeletal:  ?   Right lower leg: No edema.  ?   Left lower leg: No edema.  ?Neurological:  ?   General: No focal deficit  present.  ?   Mental Status: She is alert and oriented to person, place, and time.  ?Psychiatric:     ?   Mood and Affect: Mood normal.  ? ? ? ? ? ?   ?Assessment & Plan:  ?..Delorus was seen today for follow-up. ? ?Diagnoses and all orders for this visit: ? ?Clostridium difficile colitis ?-     CBC w/Diff/Platelet ?-     COMPLETE METABOLIC PANEL WITH GFR ?-     Fe+TIBC+Fer ? ?Hypocalcemia ?-     CBC w/Diff/Platelet ?-     COMPLETE METABOLIC PANEL WITH GFR ?-     Fe+TIBC+Fer ? ?Leukocytosis, unspecified type ?-     CBC w/Diff/Platelet ?-     COMPLETE METABOLIC PANEL WITH GFR ?-     Fe+TIBC+Fer ? ?Unintended weight loss ? ? ?Keep follow ups with GI and upcoming colonoscopy ? ?Discussed very limited use of abx going forward and concomitant vancomycin if have to do abx.  ? ?Repeat labs to recheck WBC and electrolytes that were abnormal in hospital.  ? ?Discussed nutrition and working on getting strength back.  ?Pt aware this may take many months to get 100 percent back.  ? ?Post dated next oxycodone rx.  ?..PDMP reviewed during this  encounter. ?Pain contract UTD. ?UDS UTD.  ?Follow up in every 3 months.  ? ?Spent 30 minutes reviewing chart/medications, discussing work up and plan.  ? ? ?

## 2021-05-01 ENCOUNTER — Encounter: Payer: Self-pay | Admitting: Physician Assistant

## 2021-05-01 DIAGNOSIS — R634 Abnormal weight loss: Secondary | ICD-10-CM | POA: Insufficient documentation

## 2021-05-01 DIAGNOSIS — F418 Other specified anxiety disorders: Secondary | ICD-10-CM

## 2021-05-01 DIAGNOSIS — D72829 Elevated white blood cell count, unspecified: Secondary | ICD-10-CM | POA: Insufficient documentation

## 2021-05-01 DIAGNOSIS — F4323 Adjustment disorder with mixed anxiety and depressed mood: Secondary | ICD-10-CM

## 2021-05-01 LAB — CBC WITH DIFFERENTIAL/PLATELET
Absolute Monocytes: 601 cells/uL (ref 200–950)
Basophils Absolute: 46 cells/uL (ref 0–200)
Basophils Relative: 0.5 %
Eosinophils Absolute: 9 cells/uL — ABNORMAL LOW (ref 15–500)
Eosinophils Relative: 0.1 %
HCT: 41.8 % (ref 35.0–45.0)
Hemoglobin: 13.7 g/dL (ref 11.7–15.5)
Lymphs Abs: 1301 cells/uL (ref 850–3900)
MCH: 32.9 pg (ref 27.0–33.0)
MCHC: 32.8 g/dL (ref 32.0–36.0)
MCV: 100.2 fL — ABNORMAL HIGH (ref 80.0–100.0)
MPV: 10.2 fL (ref 7.5–12.5)
Monocytes Relative: 6.6 %
Neutro Abs: 7144 cells/uL (ref 1500–7800)
Neutrophils Relative %: 78.5 %
Platelets: 180 10*3/uL (ref 140–400)
RBC: 4.17 10*6/uL (ref 3.80–5.10)
RDW: 13.6 % (ref 11.0–15.0)
Total Lymphocyte: 14.3 %
WBC: 9.1 10*3/uL (ref 3.8–10.8)

## 2021-05-01 LAB — COMPLETE METABOLIC PANEL WITH GFR
AG Ratio: 1.3 (calc) (ref 1.0–2.5)
ALT: 8 U/L (ref 6–29)
AST: 8 U/L — ABNORMAL LOW (ref 10–35)
Albumin: 3.6 g/dL (ref 3.6–5.1)
Alkaline phosphatase (APISO): 71 U/L (ref 37–153)
BUN: 13 mg/dL (ref 7–25)
CO2: 29 mmol/L (ref 20–32)
Calcium: 9 mg/dL (ref 8.6–10.4)
Chloride: 107 mmol/L (ref 98–110)
Creat: 0.93 mg/dL (ref 0.50–1.05)
Globulin: 2.7 g/dL (calc) (ref 1.9–3.7)
Glucose, Bld: 128 mg/dL — ABNORMAL HIGH (ref 65–99)
Potassium: 4.4 mmol/L (ref 3.5–5.3)
Sodium: 142 mmol/L (ref 135–146)
Total Bilirubin: 0.3 mg/dL (ref 0.2–1.2)
Total Protein: 6.3 g/dL (ref 6.1–8.1)
eGFR: 69 mL/min/{1.73_m2} (ref >=60)

## 2021-05-01 LAB — IRON,TIBC AND FERRITIN PANEL
%SAT: 19 % (calc) (ref 16–45)
Ferritin: 103 ng/mL (ref 16–288)
Iron: 44 ug/dL — ABNORMAL LOW (ref 45–160)
TIBC: 229 mcg/dL (calc) — ABNORMAL LOW (ref 250–450)

## 2021-05-01 MED ORDER — OXYCODONE-ACETAMINOPHEN 10-325 MG PO TABS: 1.0000 | ORAL_TABLET | Freq: Four times a day (QID) | ORAL | 0 refills | Status: DC | PRN

## 2021-05-01 NOTE — Progress Notes (Signed)
WBC back into normal range! GREAT news.  ?GFR is 69 again! GREAT news.  ?Electrolytes look good.  ?Serum iron low but should improve once your nutrition can get back up to where it should be.

## 2021-05-06 DIAGNOSIS — J449 Chronic obstructive pulmonary disease, unspecified: Secondary | ICD-10-CM | POA: Diagnosis not present

## 2021-05-07 ENCOUNTER — Encounter: Payer: Self-pay | Admitting: Physician Assistant

## 2021-05-08 MED ORDER — PROMETHAZINE HCL 25 MG PO TABS: 25.0000 mg | ORAL_TABLET | Freq: Four times a day (QID) | ORAL | 1 refills | Status: DC | PRN

## 2021-05-09 DIAGNOSIS — D696 Thrombocytopenia, unspecified: Secondary | ICD-10-CM | POA: Diagnosis not present

## 2021-05-09 DIAGNOSIS — Z95828 Presence of other vascular implants and grafts: Secondary | ICD-10-CM | POA: Diagnosis not present

## 2021-05-09 DIAGNOSIS — Z8616 Personal history of COVID-19: Secondary | ICD-10-CM | POA: Diagnosis not present

## 2021-05-09 DIAGNOSIS — F17210 Nicotine dependence, cigarettes, uncomplicated: Secondary | ICD-10-CM | POA: Diagnosis not present

## 2021-05-09 DIAGNOSIS — D72829 Elevated white blood cell count, unspecified: Secondary | ICD-10-CM | POA: Diagnosis not present

## 2021-05-09 DIAGNOSIS — T451X5A Adverse effect of antineoplastic and immunosuppressive drugs, initial encounter: Secondary | ICD-10-CM | POA: Diagnosis not present

## 2021-05-09 DIAGNOSIS — D6959 Other secondary thrombocytopenia: Secondary | ICD-10-CM | POA: Diagnosis not present

## 2021-05-09 DIAGNOSIS — Z85828 Personal history of other malignant neoplasm of skin: Secondary | ICD-10-CM | POA: Diagnosis not present

## 2021-05-09 DIAGNOSIS — C210 Malignant neoplasm of anus, unspecified: Secondary | ICD-10-CM | POA: Diagnosis not present

## 2021-05-15 DIAGNOSIS — I10 Essential (primary) hypertension: Secondary | ICD-10-CM | POA: Diagnosis not present

## 2021-05-15 DIAGNOSIS — C210 Malignant neoplasm of anus, unspecified: Secondary | ICD-10-CM | POA: Diagnosis not present

## 2021-05-21 ENCOUNTER — Encounter: Payer: Self-pay | Admitting: Physician Assistant

## 2021-05-29 DIAGNOSIS — I10 Essential (primary) hypertension: Secondary | ICD-10-CM | POA: Diagnosis not present

## 2021-05-29 DIAGNOSIS — R197 Diarrhea, unspecified: Secondary | ICD-10-CM | POA: Diagnosis not present

## 2021-05-29 DIAGNOSIS — A0472 Enterocolitis due to Clostridium difficile, not specified as recurrent: Secondary | ICD-10-CM | POA: Diagnosis not present

## 2021-06-05 DIAGNOSIS — J449 Chronic obstructive pulmonary disease, unspecified: Secondary | ICD-10-CM | POA: Diagnosis not present

## 2021-06-08 ENCOUNTER — Encounter: Payer: Self-pay | Admitting: Physician Assistant

## 2021-06-08 NOTE — Telephone Encounter (Signed)
Needs appt

## 2021-06-12 ENCOUNTER — Emergency Department (INDEPENDENT_AMBULATORY_CARE_PROVIDER_SITE_OTHER)
Admission: RE | Admit: 2021-06-12 | Discharge: 2021-06-12 | Disposition: A | Discharge status: Home / Self Care | Payer: BC Managed Care – PPO | Source: Ambulatory Visit

## 2021-06-12 ENCOUNTER — Emergency Department (INDEPENDENT_AMBULATORY_CARE_PROVIDER_SITE_OTHER): Payer: BC Managed Care – PPO

## 2021-06-12 VITALS — BP 141/88 | HR 100 | Temp 98.7°F | Resp 24 | Ht 67.0 in | Wt 110.0 lb

## 2021-06-12 DIAGNOSIS — J441 Chronic obstructive pulmonary disease with (acute) exacerbation: Secondary | ICD-10-CM | POA: Diagnosis not present

## 2021-06-12 DIAGNOSIS — R0602 Shortness of breath: Secondary | ICD-10-CM

## 2021-06-12 DIAGNOSIS — J439 Emphysema, unspecified: Secondary | ICD-10-CM | POA: Diagnosis not present

## 2021-06-12 MED ORDER — PREDNISONE 10 MG (21) PO TBPK: ORAL_TABLET | Freq: Every day | ORAL | 0 refills | Status: DC

## 2021-06-12 MED ORDER — METHYLPREDNISOLONE ACETATE 80 MG/ML IJ SUSP
80.0000 mg | Freq: Once | INTRAMUSCULAR | Status: AC
  Administered 2021-06-12: 80 mg via INTRAMUSCULAR

## 2021-06-12 NOTE — Telephone Encounter (Signed)
Patient also left vm on my line asking for Marialuisa Basara to send RX for Prednisone and Zpac. Patient already in Urgent Care for visit.

## 2021-06-12 NOTE — ED Notes (Signed)
O2 sat 95% on RA during assessment with M.Ragan, FNP. Pt placed back on home O2 at 2 L via [place] after assessment

## 2021-06-12 NOTE — Telephone Encounter (Signed)
Sue Smith, scheduling staff, returned pt's call and advised pt to be seen at either UC or ED as we do not have any appointments available today.  Pt also given appt with Iran Planas for June 5th.  Pt expressed understanding and is agreeable to UC or ED visit.  This information was relayed to myself from [place].  Charyl Bigger, CMA

## 2021-06-12 NOTE — ED Triage Notes (Signed)
Hx of COPD  Nasal congestion w/ SOB since Thursday Started back on O2 at 2 L [place] - on her own tank  Has not checked O2 sat at home  Breathing trx at home w/ nebulizer q 4 hours

## 2021-06-12 NOTE — ED Provider Notes (Signed)
[facility]    CSN: [id number] Arrival date & time: 06/12/21  1334      History   Chief Complaint Chief Complaint  Patient presents with   Nasal Congestion    HPI Sue Smith is a 66 y.o. female.   HPI 66 year old female presents with nasal congestion and shortness of breath for 5 days.  Patient reports starting O2 2 L nasal cannula on her own tank at home and has not checked pulse ox/O2 saturation at home.  Patient reports breathing treatment at home with DuoNeb every 4 hours.  PMH significant for COPD and HTN.  Patient attempting to contact PCP on 06/08/2021 for possible prescription for steroids for shortness of breath.  Reports shortness of breath is worse with exertion.  Prior to starting my physical exam today, advised patient to discontinue oxygen via nasal cannula while she is here in clinic so that we can monitor her oxygen saturation level without oxygen.  Past Medical History:  Diagnosis Date   Cervical dysplasia    COPD (chronic obstructive pulmonary disease) ([place])    Hyperlipidemia    Hypertension    Osteoporosis     Patient Active Problem List   Diagnosis Date Noted   Leukocytosis 05/01/2021   Unintended weight loss 05/01/2021   Hypocalcemia 04/30/2021   Clostridium difficile colitis 04/30/2021   Drug-induced constipation 03/26/2021   Common bile duct dilation 03/26/2021   History of rectal or anal cancer 03/26/2021   Colitis 03/26/2021   Anal cancer ([place]) 10/18/2020   Anal lesion 07/10/2020   Atrial fibrillation with RVR ([place]) 11/16/2019   COVID-19 virus infection 11/16/2019   Irregular heart rhythm 05/06/2019   PAC (premature atrial contraction) 05/06/2019   PVC (premature ventricular contraction) 05/06/2019   No energy 07/29/2018   Grief 06/30/2017   Current smoker 02/26/2017   Actinic keratosis 11/29/2016   Chronic pain syndrome 11/29/2016   Gastroesophageal reflux disease 11/29/2016   Simple chronic bronchitis ([place]) 08/27/2016    COPD (chronic obstructive pulmonary disease) with chronic bronchitis ([place]) 02/16/2015   Insomnia 11/25/2013   DDD (degenerative disc disease), lumbar 03/23/2013   Osteoporosis 11/25/2012   Hyperlipidemia 11/25/2012   Personal history of colonic polyps 03/26/2012   Pre-diabetes 11/20/2011   Vasomotor flushing 11/18/2011   Essential hypertension 11/04/2011   Surgical menopause 11/04/2011    Past Surgical History:  Procedure Laterality Date   ABDOMINAL HYSTERECTOMY     APPENDECTOMY     TOTAL ABDOMINAL HYSTERECTOMY W/ BILATERAL SALPINGOOPHORECTOMY      OB History   No obstetric history on file.      Home Medications    Prior to Admission medications   Medication Sig Start Date End Date Taking? Authorizing Provider  ipratropium-albuterol (DUONEB) 0.5-2.5 (3) MG/3ML SOLN Take 3 mLs by nebulization every 4 (four) hours as needed. 02/04/18  Yes Breeback, Jade L, PA-C  predniSONE (STERAPRED UNI-PAK 21 TAB) 10 MG (21) TBPK tablet Take by mouth daily. Take 6 tabs by mouth daily  for 2 days, then 5 tabs for 2 days, then 4 tabs for 2 days, then 3 tabs for 2 days, 2 tabs for 2 days, then 1 tab by mouth daily for 2 days 06/12/21  Yes Eliezer Lofts, FNP  albuterol (PROVENTIL HFA;VENTOLIN HFA) 108 (90 Base) MCG/ACT inhaler Inhale 2 puffs into the lungs every 6 (six) hours as needed for wheezing. 11/04/17   Breeback, Jade L, PA-C  alendronate (FOSAMAX) 70 MG tablet TAKE ONE TABLET BY MOUTH ONCE A WEEK, TAKE  WITH A FULL GLASS OF WATER ON AN EMPTY STOMACH 07/10/20   Breeback, Jade L, PA-C  ALPRAZolam (XANAX) 0.25 MG tablet Take by mouth. 01/28/20   [provider]  AMBULATORY NON FORMULARY MEDICATION One nebulizer machine for COPD management. 02/26/18   Breeback, Jade L, PA-C  amLODipine (NORVASC) 2.5 MG tablet Take 1 tablet (2.5 mg total) by mouth daily. 12/19/20   Breeback, Jade L, PA-C  atorvastatin (LIPITOR) 20 MG tablet Take 1 tablet (20 mg total) by mouth daily. 12/19/20   Donella Stade, PA-C  Cholecalciferol 25 MCG (1000 UT) tablet Take by mouth.    [provider]  Lactobacillus Acid-Pectin (ACIDOPHILUS/PECTIN) CAPS Take by mouth. 04/25/21   [provider]  losartan (COZAAR) 100 MG tablet Take 1 tablet (100 mg total) by mouth daily. 03/20/21   Breeback, Jade L, PA-C  montelukast (SINGULAIR) 10 MG tablet Take 1 tablet (10 mg total) by mouth at bedtime. 03/20/21   Breeback, Jade L, PA-C  ondansetron (ZOFRAN) 8 MG tablet Take 8 mg by mouth every 8 (eight) hours as needed. 05/23/21   [provider]  oxyCODONE-acetaminophen (PERCOCET) 10-325 MG tablet Take 1 tablet by mouth every 6 (six) hours as needed for pain. 08/20/21   Breeback, Royetta Car, PA-C  oxyCODONE-acetaminophen (PERCOCET) 10-325 MG tablet Take 1 tablet by mouth every 6 (six) hours as needed for pain. 07/20/21   Breeback, Royetta Car, PA-C  oxyCODONE-acetaminophen (PERCOCET) 10-325 MG tablet Take 1 tablet by mouth every 6 (six) hours as needed for pain. 06/20/21   Breeback, Jade L, PA-C  pantoprazole (PROTONIX) 40 MG tablet Take 40 mg by mouth 2 (two) times daily. 04/07/21   [provider]  promethazine (PHENERGAN) 25 MG tablet Take 1 tablet (25 mg total) by mouth every 6 (six) hours as needed for nausea or vomiting. 05/08/21   Donella Stade, PA-C  TRELEGY ELLIPTA 100-62.5-25 MCG/INH AEPB INHALE 1 PUFF ONCE DAILY 01/18/19   Donella Stade, PA-C  vancomycin (VANCOCIN) 125 MG capsule Take by mouth. 06/09/21   [provider]    Family History Family History  Problem Relation Age of Onset   Cancer Mother    Heart failure Father    CAD Father     Social History Social History   Tobacco Use   Smoking status: Former    Packs/day: 0.50    Types: Cigarettes    Quit date: 11/02/2019    Years since quitting: 1.6   Smokeless tobacco: Never  Vaping Use   Vaping Use: Never used  Substance Use Topics   Alcohol use: Never   Drug use: No     Allergies   Ampicillin, Erythromycin,  Keflex [cephalexin], Penicillins, Sulfa antibiotics, Doxycycline, Tetracyclines & related, Chantix [varenicline], and Hctz [hydrochlorothiazide]   Review of Systems Review of Systems  HENT:  Positive for congestion.   Respiratory:  Positive for shortness of breath.   All other systems reviewed and are negative.   Physical Exam Triage Vital Signs ED Triage Vitals  Enc Vitals Group     BP      Pulse      Resp      Temp      Temp src      SpO2      Weight      Height      Head Circumference      Peak Flow      Pain Score      Pain Loc  Pain Edu?      Excl. in [place]?    No data found.  Updated Vital Signs BP (!) 141/88 (BP Location: Right Arm)   Pulse 100   Temp 98.7 F (37.1 C) (Oral)   Resp (!) 24   Ht '5\' 7"'$  (1.702 m)   Wt 110 lb (49.9 kg)   SpO2 97%   BMI 17.23 kg/m      Physical Exam Vitals and nursing note reviewed.  Constitutional:      General: She is not in acute distress.    Appearance: Normal appearance. She is normal weight. She is not ill-appearing.  HENT:     Head: Normocephalic and atraumatic.     Right Ear: Tympanic membrane, ear canal and external ear normal.     Left Ear: Tympanic membrane, ear canal and external ear normal.     Mouth/Throat:     Mouth: Mucous membranes are moist.     Pharynx: Oropharynx is clear.  Eyes:     Extraocular Movements: Extraocular movements intact.     Conjunctiva/sclera: Conjunctivae normal.     Pupils: Pupils are equal, round, and reactive to light.  Cardiovascular:     Rate and Rhythm: Normal rate and regular rhythm.     Pulses: Normal pulses.     Heart sounds: Normal heart sounds.  Pulmonary:     Effort: Pulmonary effort is normal.     Breath sounds: No wheezing, rhonchi or rales.     Comments: Diminished breath sounds bibasilarly, SPO2 remained at 97% during the exam, patient transported to chest x-ray in wheelchair to avoid dyspnea with exertion Musculoskeletal:     Cervical back: Normal range of  motion and neck supple. No tenderness.  Lymphadenopathy:     Cervical: No cervical adenopathy.  Skin:    General: Skin is warm and dry.  Neurological:     General: No focal deficit present.     Mental Status: She is alert and oriented to person, place, and time.     UC Treatments / Results  Labs (all labs ordered are listed, but only abnormal results are displayed) Labs Reviewed - No data to display  EKG   Radiology DG Chest 2 View  Result Date: 06/12/2021 CLINICAL DATA:  Shortness of breath. EXAM: CHEST - 2 VIEW COMPARISON:  Chest x-ray and chest CT 02/05/2021 FINDINGS: The cardiac silhouette, mediastinal and hilar contours are within normal limits and stable. Stable right-sided Port-A-Cath. Stable chronic lung changes with emphysema and pulmonary scarring. No acute overlying pulmonary process. No worrisome pulmonary lesions or pulmonary nodules. No pneumothorax. The bony thorax is intact. IMPRESSION: Chronic lung changes but no acute overlying pulmonary process. Electronically Signed   By: Marijo Sanes M.D.   On: 06/12/2021 14:16    Procedures Procedures (including critical care time)  Medications Ordered in UC Medications  methylPREDNISolone acetate (DEPO-MEDROL) injection 80 mg (80 mg Intramuscular Given 06/12/21 1428)    Initial Impression / Assessment and Plan / UC Course  I have reviewed the triage vital signs and the nursing notes.  Pertinent labs & imaging results that were available during my care of the patient were reviewed by me and considered in my medical decision making (see chart for details).     MDM: 1.  COPD exacerbation-CXR revealed above, IM Depo-Medrol 80 mg given once in clinic prior to discharge, SpO2 95% on room air prior to discharge, Rx'd Sterapred Uni-Pak. Advised patient to start oral prednisone taper in 3 hours from time  IM injection received in clinic today.  Instructed patient to take medication as directed with food to completion.  Encouraged  patient increase daily water intake while taking this medication.  Increase daily water intake while taking these medications.  Advised patient if symptoms worsen and/or unresolved please follow-up with PCP, nearest ED, or here for further evaluation. Final Clinical Impressions(s) / UC Diagnoses   Final diagnoses:  COPD exacerbation ([place])     Discharge Instructions      Advised patient to start oral prednisone taper in 3 hours from time  IM injection received in clinic today.  Instructed patient to take medication as directed with food to completion.  Encouraged patient increase daily water intake while taking this medication.  Increase daily water intake while taking these medications.  Advised patient if symptoms worsen and/or unresolved please follow-up with PCP, nearest ED, or here for further evaluation.     ED Prescriptions     Medication Sig Dispense Auth. Provider   predniSONE (STERAPRED UNI-PAK 21 TAB) 10 MG (21) TBPK tablet Take by mouth daily. Take 6 tabs by mouth daily  for 2 days, then 5 tabs for 2 days, then 4 tabs for 2 days, then 3 tabs for 2 days, 2 tabs for 2 days, then 1 tab by mouth daily for 2 days 42 tablet Eliezer Lofts, FNP      PDMP not reviewed this encounter.   Eliezer Lofts, [place] 06/12/21 1450

## 2021-06-12 NOTE — Discharge Instructions (Addendum)
Advised patient to start oral prednisone taper in 3 hours from time  IM injection received in clinic today.  Instructed patient to take medication as directed with food to completion.  Encouraged patient increase daily water intake while taking this medication.  Increase daily water intake while taking these medications.  Advised patient if symptoms worsen and/or unresolved please follow-up with PCP, nearest ED, or here for further evaluation.

## 2021-06-18 ENCOUNTER — Encounter: Payer: Self-pay | Admitting: Physician Assistant

## 2021-06-18 ENCOUNTER — Ambulatory Visit: Payer: BC Managed Care – PPO | Admitting: Physician Assistant

## 2021-06-18 VITALS — BP 137/82 | HR 92 | Ht 67.0 in | Wt 108.0 lb

## 2021-06-18 DIAGNOSIS — I10 Essential (primary) hypertension: Secondary | ICD-10-CM | POA: Diagnosis not present

## 2021-06-18 DIAGNOSIS — K219 Gastro-esophageal reflux disease without esophagitis: Secondary | ICD-10-CM | POA: Diagnosis not present

## 2021-06-18 DIAGNOSIS — A0472 Enterocolitis due to Clostridium difficile, not specified as recurrent: Secondary | ICD-10-CM | POA: Diagnosis not present

## 2021-06-18 DIAGNOSIS — D509 Iron deficiency anemia, unspecified: Secondary | ICD-10-CM | POA: Diagnosis not present

## 2021-06-18 DIAGNOSIS — D696 Thrombocytopenia, unspecified: Secondary | ICD-10-CM | POA: Diagnosis not present

## 2021-06-18 DIAGNOSIS — J449 Chronic obstructive pulmonary disease, unspecified: Secondary | ICD-10-CM

## 2021-06-18 DIAGNOSIS — J441 Chronic obstructive pulmonary disease with (acute) exacerbation: Secondary | ICD-10-CM | POA: Diagnosis not present

## 2021-06-18 MED ORDER — PREDNISONE 50 MG PO TABS: 50.0000 mg | ORAL_TABLET | Freq: Every day | ORAL | 0 refills | Status: DC

## 2021-06-18 NOTE — Progress Notes (Signed)
   Established Patient Office Visit ED Follow Up  Subjective   Patient ID: Sue Smith, female    DOB: 04/15/1955  Age: 66 y.o. MRN: [id number]  Chief Complaint  Patient presents with   Follow-up    HPI  Patient is a 66 year old female with a history of COPD, Tobacco Use Disorder, Primary HTN, C-Diff Colitis, Osteoporosis, Degenerative Disc Disease, and Hyperlipidemia presenting for a follow up after a recent visit to the  5/30 [facility] where she was treated for an acute exacerbation of COPD. She received 80 mg depo-medrol IM and Sterapred Uni-Pak. She reportedly required continuous supplemental home oxygen. O2 sat was 95% when leaving the Urgent Care. She reports that she is currently doing much better. She is only using a half liter of supplemental oxygen at night. Denies any current shortness of breath, coughing, or dizziness.   She continues on vancomycin for C.diff and GI is managing.   Review of Systems  All other systems reviewed and are negative.    Objective:     BP 137/82   Pulse 92   Ht '5\' 7"'$  (1.702 m)   Wt 49 kg   SpO2 98%   BMI 16.92 kg/m  BP Readings from Last 3 Encounters:  06/18/21 137/82  06/12/21 (!) 141/88  04/30/21 (!) 155/85      Physical Exam Vitals reviewed.  Constitutional:      General: She is not in acute distress.    Appearance: Normal appearance. She is not ill-appearing.  HENT:     Head: Normocephalic and atraumatic.  Eyes:     Extraocular Movements: Extraocular movements intact.  Cardiovascular:     Rate and Rhythm: Normal rate and regular rhythm.     Pulses: Normal pulses.     Heart sounds: Normal heart sounds.  Pulmonary:     Effort: Pulmonary effort is normal.     Breath sounds: No wheezing, rhonchi or rales.     Comments: Coarse and distant breath sounds noted bilaterally.  Musculoskeletal:     Right lower leg: No edema.     Left lower leg: No edema.  Skin:    General: Skin is warm and dry.   Neurological:     Mental Status: She is alert and oriented to person, place, and time.  Psychiatric:        Mood and Affect: Mood normal.        Behavior: Behavior normal.        Assessment & Plan:    Rylei was seen today for follow-up.  Diagnoses and all orders for this visit:  COPD (chronic obstructive pulmonary disease) with chronic bronchitis (HCC) -     predniSONE (DELTASONE) 50 MG tablet; Take 1 tablet (50 mg total) by mouth daily.      - Trelegy sample given in office.  Pt is doing better and no concerns- vitals look stable   Continue on same medications for mantainence- given prednisone burst to save for next exacerbation to catch early.

## 2021-06-26 DIAGNOSIS — C210 Malignant neoplasm of anus, unspecified: Secondary | ICD-10-CM | POA: Diagnosis not present

## 2021-06-26 DIAGNOSIS — Z95828 Presence of other vascular implants and grafts: Secondary | ICD-10-CM | POA: Diagnosis not present

## 2021-07-01 ENCOUNTER — Other Ambulatory Visit: Payer: Self-pay | Admitting: Physician Assistant

## 2021-07-01 DIAGNOSIS — I10 Essential (primary) hypertension: Secondary | ICD-10-CM

## 2021-07-06 DIAGNOSIS — J449 Chronic obstructive pulmonary disease, unspecified: Secondary | ICD-10-CM | POA: Diagnosis not present

## 2021-07-09 ENCOUNTER — Encounter: Payer: Self-pay | Admitting: Physician Assistant

## 2021-07-20 ENCOUNTER — Telehealth (INDEPENDENT_AMBULATORY_CARE_PROVIDER_SITE_OTHER): Payer: BC Managed Care – PPO | Admitting: Physician Assistant

## 2021-07-20 ENCOUNTER — Encounter: Payer: Self-pay | Admitting: Physician Assistant

## 2021-07-20 VITALS — BP 137/90 | Ht 67.0 in | Wt 111.0 lb

## 2021-07-20 DIAGNOSIS — J441 Chronic obstructive pulmonary disease with (acute) exacerbation: Secondary | ICD-10-CM | POA: Diagnosis not present

## 2021-07-20 DIAGNOSIS — J449 Chronic obstructive pulmonary disease, unspecified: Secondary | ICD-10-CM

## 2021-07-20 DIAGNOSIS — F17200 Nicotine dependence, unspecified, uncomplicated: Secondary | ICD-10-CM

## 2021-07-20 MED ORDER — PREDNISONE 10 MG (21) PO TBPK: ORAL_TABLET | Freq: Every day | ORAL | 0 refills | Status: DC

## 2021-07-20 MED ORDER — AZITHROMYCIN 250 MG PO TABS: ORAL_TABLET | ORAL | 0 refills | Status: DC

## 2021-07-20 NOTE — Progress Notes (Unsigned)
Pt states she has sob since , pt states she using the nebulizer and inhaler more often pt states prednisone did not work, pt states she has mucus and chest congestion pt reports no fever.

## 2021-07-20 NOTE — Telephone Encounter (Signed)
Left message for patient to call back and schedule appointment.

## 2021-07-20 NOTE — Progress Notes (Unsigned)
..Virtual Visit via Video Note  I connected with Sue Smith on 07/22/21 at  3:40 PM EDT by a video enabled telemedicine application and verified that I am speaking with the correct person using two identifiers.  Location: Patient: home Provider: clinic  .Marland KitchenParticipating in visit:  Patient: Sue Smith Provider: Iran Planas PA-C   I discussed the limitations of evaluation and management by telemedicine and the availability of in person appointments. The patient expressed understanding and agreed to proceed.  History of Present Illness: Pt is a 66 yo with COPD with chronic respiratory failure who sees pulmonology and has frequent COPD exacerbations. She was outside on Saturday night and woke up Sunday morning with SOB, cough, chest tightness. Started prednisone burst she had in cabinet. Had to start using O2 continuously at 2L. Using duoneb every 4 hours. On trelegy daily. Dry cough. No fever, chills, body aches or sick contacts. Improved a little prednisone but continues to feel really tight in chest.   .. Active Ambulatory Problems    Diagnosis Date Noted   Essential hypertension 11/04/2011   Surgical menopause 11/04/2011   Vasomotor flushing 11/18/2011   Pre-diabetes 11/20/2011   Personal history of colonic polyps 03/26/2012   Osteoporosis 11/25/2012   Hyperlipidemia 11/25/2012   DDD (degenerative disc disease), lumbar 03/23/2013   Insomnia 11/25/2013   COPD (chronic obstructive pulmonary disease) with chronic bronchitis ([place]) 02/16/2015   Simple chronic bronchitis ([place]) 08/27/2016   Actinic keratosis 11/29/2016   Chronic pain syndrome 11/29/2016   Gastroesophageal reflux disease 11/29/2016   Current smoker 02/26/2017   Grief 06/30/2017   No energy 07/29/2018   Irregular heart rhythm 05/06/2019   PAC (premature atrial contraction) 05/06/2019   PVC (premature ventricular contraction) 05/06/2019   Atrial fibrillation with RVR ([place]) 11/16/2019   COVID-19 virus infection  11/16/2019   Anal lesion 07/10/2020   Anal cancer ([place]) 10/18/2020   Drug-induced constipation 03/26/2021   Common bile duct dilation 03/26/2021   History of rectal or anal cancer 03/26/2021   Colitis 03/26/2021   Hypocalcemia 04/30/2021   Clostridium difficile colitis 04/30/2021   Leukocytosis 05/01/2021   Unintended weight loss 05/01/2021   Resolved Ambulatory Problems    Diagnosis Date Noted   Post-viral cough syndrome 11/16/2019   Past Medical History:  Diagnosis Date   Cervical dysplasia    COPD (chronic obstructive pulmonary disease) ([place])    Hypertension        Observations/Objective: No acute distress Some labored breathing on 2 L of O2 Dry cough  .Marland Kitchen Today's Vitals   07/20/21 1458  BP: 137/90  SpO2: 96%  Weight: 111 lb (50.3 kg)  Height: '5\' 7"'$  (1.702 m)   Body mass index is 17.39 kg/m.    Assessment and Plan: Marland KitchenMarland KitchenShaela was seen today for shortness of breath.  Diagnoses and all orders for this visit:  COPD exacerbation ([place]) -     predniSONE (STERAPRED UNI-PAK 21 TAB) 10 MG (21) TBPK tablet; Take by mouth daily. Take 6 tabs by mouth daily  for 2 days, then 5 tabs for 2 days, then 4 tabs for 2 days, then 3 tabs for 2 days, 2 tabs for 2 days, then 1 tab by mouth daily for 2 days -     azithromycin (ZITHROMAX Z-PAK) 250 MG tablet; Take 2 tablets (500 mg) on  Day 1,  followed by 1 tablet (250 mg) once daily on Days 2 through 5.  COPD (chronic obstructive pulmonary disease) with chronic bronchitis ([place]) -  predniSONE (STERAPRED UNI-PAK 21 TAB) 10 MG (21) TBPK tablet; Take by mouth daily. Take 6 tabs by mouth daily  for 2 days, then 5 tabs for 2 days, then 4 tabs for 2 days, then 3 tabs for 2 days, 2 tabs for 2 days, then 1 tab by mouth daily for 2 days -     azithromycin (ZITHROMAX Z-PAK) 250 MG tablet; Take 2 tablets (500 mg) on  Day 1,  followed by 1 tablet (250 mg) once daily on Days 2 through 5.  Current smoker   Added a longer taper of prednisone.   Added zpak.  Continue to use nebulizer 2-4 hours. Continue O2 2L. Follow up as needed or if symptoms worsen.   Pt continues to cut back on smoking but she has not 100 percent stopped smoking. Pt aware of importance of this.     Follow Up Instructions:    I discussed the assessment and treatment plan with the patient. The patient was provided an opportunity to ask questions and all were answered. The patient agreed with the plan and demonstrated an understanding of the instructions.   The patient was advised to call back or seek an in-person evaluation if the symptoms worsen or if the condition fails to improve as anticipated.   Iran Planas, PA-C

## 2021-07-22 ENCOUNTER — Encounter: Payer: Self-pay | Admitting: Physician Assistant

## 2021-07-23 ENCOUNTER — Encounter: Payer: Self-pay | Admitting: Physician Assistant

## 2021-07-23 MED ORDER — VANCOMYCIN HCL 125 MG PO CAPS: ORAL_CAPSULE | ORAL | 0 refills | Status: DC

## 2021-07-23 NOTE — Telephone Encounter (Signed)
Patient also left a vm asking this.

## 2021-07-24 NOTE — Telephone Encounter (Signed)
Task completed. Trelegy sample left for patient to pick up this afternoon from front office.

## 2021-07-30 DIAGNOSIS — C210 Malignant neoplasm of anus, unspecified: Secondary | ICD-10-CM | POA: Diagnosis not present

## 2021-07-30 DIAGNOSIS — K828 Other specified diseases of gallbladder: Secondary | ICD-10-CM | POA: Diagnosis not present

## 2021-07-30 DIAGNOSIS — K769 Liver disease, unspecified: Secondary | ICD-10-CM | POA: Diagnosis not present

## 2021-07-30 DIAGNOSIS — R918 Other nonspecific abnormal finding of lung field: Secondary | ICD-10-CM | POA: Diagnosis not present

## 2021-07-30 DIAGNOSIS — K6389 Other specified diseases of intestine: Secondary | ICD-10-CM | POA: Diagnosis not present

## 2021-07-30 DIAGNOSIS — J984 Other disorders of lung: Secondary | ICD-10-CM | POA: Diagnosis not present

## 2021-07-30 DIAGNOSIS — I2510 Atherosclerotic heart disease of native coronary artery without angina pectoris: Secondary | ICD-10-CM | POA: Diagnosis not present

## 2021-07-31 ENCOUNTER — Encounter: Payer: Self-pay | Admitting: Physician Assistant

## 2021-08-05 ENCOUNTER — Encounter: Payer: Self-pay | Admitting: Physician Assistant

## 2021-08-05 DIAGNOSIS — J449 Chronic obstructive pulmonary disease, unspecified: Secondary | ICD-10-CM | POA: Diagnosis not present

## 2021-08-08 DIAGNOSIS — Z95828 Presence of other vascular implants and grafts: Secondary | ICD-10-CM | POA: Diagnosis not present

## 2021-08-08 DIAGNOSIS — C210 Malignant neoplasm of anus, unspecified: Secondary | ICD-10-CM | POA: Diagnosis not present

## 2021-08-08 DIAGNOSIS — D696 Thrombocytopenia, unspecified: Secondary | ICD-10-CM | POA: Diagnosis not present

## 2021-08-08 DIAGNOSIS — D72829 Elevated white blood cell count, unspecified: Secondary | ICD-10-CM | POA: Diagnosis not present

## 2021-08-20 DIAGNOSIS — F17210 Nicotine dependence, cigarettes, uncomplicated: Secondary | ICD-10-CM | POA: Diagnosis not present

## 2021-08-20 DIAGNOSIS — J441 Chronic obstructive pulmonary disease with (acute) exacerbation: Secondary | ICD-10-CM | POA: Diagnosis not present

## 2021-08-20 DIAGNOSIS — R911 Solitary pulmonary nodule: Secondary | ICD-10-CM | POA: Diagnosis not present

## 2021-08-20 DIAGNOSIS — I10 Essential (primary) hypertension: Secondary | ICD-10-CM | POA: Diagnosis not present

## 2021-08-22 DIAGNOSIS — C210 Malignant neoplasm of anus, unspecified: Secondary | ICD-10-CM | POA: Diagnosis not present

## 2021-08-22 DIAGNOSIS — I10 Essential (primary) hypertension: Secondary | ICD-10-CM | POA: Diagnosis not present

## 2021-08-28 DIAGNOSIS — I700 Atherosclerosis of aorta: Secondary | ICD-10-CM | POA: Diagnosis not present

## 2021-08-28 DIAGNOSIS — R911 Solitary pulmonary nodule: Secondary | ICD-10-CM | POA: Diagnosis not present

## 2021-08-28 DIAGNOSIS — Z85048 Personal history of other malignant neoplasm of rectum, rectosigmoid junction, and anus: Secondary | ICD-10-CM | POA: Diagnosis not present

## 2021-08-28 DIAGNOSIS — R918 Other nonspecific abnormal finding of lung field: Secondary | ICD-10-CM | POA: Diagnosis not present

## 2021-08-28 DIAGNOSIS — J9811 Atelectasis: Secondary | ICD-10-CM | POA: Diagnosis not present

## 2021-08-28 DIAGNOSIS — C210 Malignant neoplasm of anus, unspecified: Secondary | ICD-10-CM | POA: Diagnosis not present

## 2021-09-05 DIAGNOSIS — F17210 Nicotine dependence, cigarettes, uncomplicated: Secondary | ICD-10-CM | POA: Diagnosis not present

## 2021-09-05 DIAGNOSIS — R0602 Shortness of breath: Secondary | ICD-10-CM | POA: Diagnosis not present

## 2021-09-05 DIAGNOSIS — J449 Chronic obstructive pulmonary disease, unspecified: Secondary | ICD-10-CM | POA: Diagnosis not present

## 2021-09-05 DIAGNOSIS — I10 Essential (primary) hypertension: Secondary | ICD-10-CM | POA: Diagnosis not present

## 2021-09-14 ENCOUNTER — Encounter: Payer: Self-pay | Admitting: Physician Assistant

## 2021-09-14 ENCOUNTER — Ambulatory Visit (INDEPENDENT_AMBULATORY_CARE_PROVIDER_SITE_OTHER): Payer: BC Managed Care – PPO | Admitting: Physician Assistant

## 2021-09-14 VITALS — BP 140/73 | HR 93 | Ht 67.0 in | Wt 116.0 lb

## 2021-09-14 DIAGNOSIS — F17200 Nicotine dependence, unspecified, uncomplicated: Secondary | ICD-10-CM

## 2021-09-14 DIAGNOSIS — M5136 Other intervertebral disc degeneration, lumbar region: Secondary | ICD-10-CM | POA: Diagnosis not present

## 2021-09-14 DIAGNOSIS — G894 Chronic pain syndrome: Secondary | ICD-10-CM | POA: Diagnosis not present

## 2021-09-14 DIAGNOSIS — Z23 Encounter for immunization: Secondary | ICD-10-CM

## 2021-09-14 DIAGNOSIS — I10 Essential (primary) hypertension: Secondary | ICD-10-CM | POA: Diagnosis not present

## 2021-09-14 DIAGNOSIS — J449 Chronic obstructive pulmonary disease, unspecified: Secondary | ICD-10-CM

## 2021-09-14 DIAGNOSIS — T7840XD Allergy, unspecified, subsequent encounter: Secondary | ICD-10-CM

## 2021-09-14 DIAGNOSIS — C210 Malignant neoplasm of anus, unspecified: Secondary | ICD-10-CM

## 2021-09-14 MED ORDER — OXYCODONE-ACETAMINOPHEN 10-325 MG PO TABS: 1.0000 | ORAL_TABLET | Freq: Four times a day (QID) | ORAL | 0 refills | Status: DC | PRN

## 2021-09-14 MED ORDER — LORATADINE 10 MG PO TABS: 10.0000 mg | ORAL_TABLET | Freq: Every day | ORAL | 3 refills | Status: DC

## 2021-09-14 MED ORDER — AMLODIPINE BESYLATE 2.5 MG PO TABS: 2.5000 mg | ORAL_TABLET | Freq: Every day | ORAL | 3 refills | Status: DC

## 2021-09-14 NOTE — Progress Notes (Unsigned)
Established Patient Office Visit  Subjective   Patient ID: Sue Smith, female    DOB: 02-17-1955  Age: 66 y.o. MRN: [id number]  Chief Complaint  Patient presents with   Follow-up   Pain    HPI Pt is a 66 yo female with COPD, anal cancer, chronic pain syndrome, HTN who presents to the clinic for 3 month follow up.   Pt is doing well with pain control. No concerns.   She had a PET scan recently and showed no cancer.   She has COPD last exacerbation 8/7. She is on O2 only at night. She is compliant with all other inhalers. She does continue to smoke.   .. Active Ambulatory Problems    Diagnosis Date Noted   Essential hypertension 11/04/2011   Surgical menopause 11/04/2011   Vasomotor flushing 11/18/2011   Pre-diabetes 11/20/2011   Personal history of colonic polyps 03/26/2012   Osteoporosis 11/25/2012   Hyperlipidemia 11/25/2012   DDD (degenerative disc disease), lumbar 03/23/2013   Insomnia 11/25/2013   COPD (chronic obstructive pulmonary disease) with chronic bronchitis ([place]) 02/16/2015   Simple chronic bronchitis ([place]) 08/27/2016   Actinic keratosis 11/29/2016   Chronic pain syndrome 11/29/2016   Gastroesophageal reflux disease 11/29/2016   Current smoker 02/26/2017   Grief 06/30/2017   No energy 07/29/2018   Irregular heart rhythm 05/06/2019   PAC (premature atrial contraction) 05/06/2019   PVC (premature ventricular contraction) 05/06/2019   Atrial fibrillation with RVR ([place]) 11/16/2019   COVID-19 virus infection 11/16/2019   Anal lesion 07/10/2020   Anal cancer ([place]) 10/18/2020   Drug-induced constipation 03/26/2021   Common bile duct dilation 03/26/2021   History of rectal or anal cancer 03/26/2021   Colitis 03/26/2021   Hypocalcemia 04/30/2021   Clostridium difficile colitis 04/30/2021   Leukocytosis 05/01/2021   Unintended weight loss 05/01/2021   Resolved Ambulatory Problems    Diagnosis Date Noted   Post-viral cough syndrome 11/16/2019    Past Medical History:  Diagnosis Date   Cervical dysplasia    COPD (chronic obstructive pulmonary disease) ([place])    Hypertension      ROS See HPI.    Objective:     BP (!) 140/73   Pulse 93   Ht '5\' 7"'$  (1.702 m)   Wt 116 lb (52.6 kg)   SpO2 97%   BMI 18.17 kg/m  BP Readings from Last 3 Encounters:  09/14/21 (!) 140/73  07/20/21 137/90  06/18/21 137/82   Wt Readings from Last 3 Encounters:  09/14/21 116 lb (52.6 kg)  07/20/21 111 lb (50.3 kg)  06/18/21 108 lb (49 kg)      Physical Exam Constitutional:      Appearance: Normal appearance.  HENT:     Head: Normocephalic.  Cardiovascular:     Rate and Rhythm: Normal rate and regular rhythm.     Heart sounds: Murmur heard.  Pulmonary:     Effort: Pulmonary effort is normal.     Comments: Coarse breath sounds Musculoskeletal:     Right lower leg: No edema.     Left lower leg: No edema.  Neurological:     General: No focal deficit present.     Mental Status: She is alert and oriented to person, place, and time.  Psychiatric:        Mood and Affect: Mood normal.        Assessment & Plan:  Marland KitchenMarland KitchenInesha was seen today for follow-up and pain.  Diagnoses and all orders for this visit:  Chronic pain syndrome -     oxyCODONE-acetaminophen (PERCOCET) 10-325 MG tablet; Take 1 tablet by mouth every 6 (six) hours as needed for pain. -     oxyCODONE-acetaminophen (PERCOCET) 10-325 MG tablet; Take 1 tablet by mouth every 6 (six) hours as needed for pain. -     oxyCODONE-acetaminophen (PERCOCET) 10-325 MG tablet; Take 1 tablet by mouth every 6 (six) hours as needed for pain.  Flu vaccine need -     Flu Vaccine QUAD 48moIM (Fluarix, Fluzone & Alfiuria Quad PF)  DDD (degenerative disc disease), lumbar -     oxyCODONE-acetaminophen (PERCOCET) 10-325 MG tablet; Take 1 tablet by mouth every 6 (six) hours as needed for pain. -     oxyCODONE-acetaminophen (PERCOCET) 10-325 MG tablet; Take 1 tablet by mouth every 6 (six) hours  as needed for pain. -     oxyCODONE-acetaminophen (PERCOCET) 10-325 MG tablet; Take 1 tablet by mouth every 6 (six) hours as needed for pain.  Essential hypertension -     amLODipine (NORVASC) 2.5 MG tablet; Take 1 tablet (2.5 mg total) by mouth daily.  Allergy, subsequent encounter -     loratadine (CLARITIN) 10 MG tablet; Take 1 tablet (10 mg total) by mouth daily.  Current smoker  COPD (chronic obstructive pulmonary disease) with chronic bronchitis ([place]  Anal cancer (HCC)   COPD-managed by pulmonology. No on O2 today.  She is taking medication She does continue to smoke she is working on cutting back Claritin refilled  Anal cancer- managed by oncology-no current treatment she is being monitored closely  Pain contract UTD ..Marland KitchenDMP reviewed during this encounter. No concerns UDS UTD Oxycodone refilled Follow up in 3 months    Return in about 3 months (around 12/14/2021).    JIran Planas PA-C

## 2021-09-26 DIAGNOSIS — Z95828 Presence of other vascular implants and grafts: Secondary | ICD-10-CM | POA: Diagnosis not present

## 2021-09-26 DIAGNOSIS — C210 Malignant neoplasm of anus, unspecified: Secondary | ICD-10-CM | POA: Diagnosis not present

## 2021-10-06 DIAGNOSIS — J449 Chronic obstructive pulmonary disease, unspecified: Secondary | ICD-10-CM | POA: Diagnosis not present

## 2021-10-09 DIAGNOSIS — Z7951 Long term (current) use of inhaled steroids: Secondary | ICD-10-CM | POA: Diagnosis not present

## 2021-10-09 DIAGNOSIS — J449 Chronic obstructive pulmonary disease, unspecified: Secondary | ICD-10-CM | POA: Diagnosis not present

## 2021-10-09 DIAGNOSIS — C210 Malignant neoplasm of anus, unspecified: Secondary | ICD-10-CM | POA: Diagnosis not present

## 2021-10-09 DIAGNOSIS — Z800 Family history of malignant neoplasm of digestive organs: Secondary | ICD-10-CM | POA: Diagnosis not present

## 2021-10-09 DIAGNOSIS — Z79899 Other long term (current) drug therapy: Secondary | ICD-10-CM | POA: Diagnosis not present

## 2021-10-09 DIAGNOSIS — Z881 Allergy status to other antibiotic agents status: Secondary | ICD-10-CM | POA: Diagnosis not present

## 2021-10-09 DIAGNOSIS — Z79891 Long term (current) use of opiate analgesic: Secondary | ICD-10-CM | POA: Diagnosis not present

## 2021-10-09 DIAGNOSIS — Z85828 Personal history of other malignant neoplasm of skin: Secondary | ICD-10-CM | POA: Diagnosis not present

## 2021-10-09 DIAGNOSIS — Z7982 Long term (current) use of aspirin: Secondary | ICD-10-CM | POA: Diagnosis not present

## 2021-10-09 DIAGNOSIS — F17210 Nicotine dependence, cigarettes, uncomplicated: Secondary | ICD-10-CM | POA: Diagnosis not present

## 2021-10-09 DIAGNOSIS — Z880 Allergy status to penicillin: Secondary | ICD-10-CM | POA: Diagnosis not present

## 2021-10-09 DIAGNOSIS — Z882 Allergy status to sulfonamides status: Secondary | ICD-10-CM | POA: Diagnosis not present

## 2021-10-09 DIAGNOSIS — Z8616 Personal history of COVID-19: Secondary | ICD-10-CM | POA: Diagnosis not present

## 2021-10-09 DIAGNOSIS — I10 Essential (primary) hypertension: Secondary | ICD-10-CM | POA: Diagnosis not present

## 2021-10-09 DIAGNOSIS — Z08 Encounter for follow-up examination after completed treatment for malignant neoplasm: Secondary | ICD-10-CM | POA: Diagnosis not present

## 2021-10-11 ENCOUNTER — Encounter: Payer: Self-pay | Admitting: Physician Assistant

## 2021-10-11 DIAGNOSIS — I10 Essential (primary) hypertension: Secondary | ICD-10-CM | POA: Diagnosis not present

## 2021-10-11 DIAGNOSIS — J449 Chronic obstructive pulmonary disease, unspecified: Secondary | ICD-10-CM | POA: Diagnosis not present

## 2021-10-11 DIAGNOSIS — F17210 Nicotine dependence, cigarettes, uncomplicated: Secondary | ICD-10-CM | POA: Diagnosis not present

## 2021-10-11 DIAGNOSIS — R911 Solitary pulmonary nodule: Secondary | ICD-10-CM | POA: Diagnosis not present

## 2021-10-11 DIAGNOSIS — B370 Candidal stomatitis: Secondary | ICD-10-CM | POA: Diagnosis not present

## 2021-10-30 DIAGNOSIS — J449 Chronic obstructive pulmonary disease, unspecified: Secondary | ICD-10-CM | POA: Diagnosis not present

## 2021-10-30 DIAGNOSIS — J3089 Other allergic rhinitis: Secondary | ICD-10-CM | POA: Diagnosis not present

## 2021-10-30 DIAGNOSIS — F17210 Nicotine dependence, cigarettes, uncomplicated: Secondary | ICD-10-CM | POA: Diagnosis not present

## 2021-10-30 DIAGNOSIS — R911 Solitary pulmonary nodule: Secondary | ICD-10-CM | POA: Diagnosis not present

## 2021-10-30 DIAGNOSIS — I10 Essential (primary) hypertension: Secondary | ICD-10-CM | POA: Diagnosis not present

## 2021-11-05 DIAGNOSIS — J432 Centrilobular emphysema: Secondary | ICD-10-CM | POA: Diagnosis not present

## 2021-11-05 DIAGNOSIS — K760 Fatty (change of) liver, not elsewhere classified: Secondary | ICD-10-CM | POA: Diagnosis not present

## 2021-11-05 DIAGNOSIS — C210 Malignant neoplasm of anus, unspecified: Secondary | ICD-10-CM | POA: Diagnosis not present

## 2021-11-05 DIAGNOSIS — R600 Localized edema: Secondary | ICD-10-CM | POA: Diagnosis not present

## 2021-11-05 DIAGNOSIS — J449 Chronic obstructive pulmonary disease, unspecified: Secondary | ICD-10-CM | POA: Diagnosis not present

## 2021-11-05 DIAGNOSIS — K449 Diaphragmatic hernia without obstruction or gangrene: Secondary | ICD-10-CM | POA: Diagnosis not present

## 2021-11-05 DIAGNOSIS — K6389 Other specified diseases of intestine: Secondary | ICD-10-CM | POA: Diagnosis not present

## 2021-11-05 DIAGNOSIS — J479 Bronchiectasis, uncomplicated: Secondary | ICD-10-CM | POA: Diagnosis not present

## 2021-11-12 ENCOUNTER — Encounter: Payer: Self-pay | Admitting: Physician Assistant

## 2021-11-12 DIAGNOSIS — R911 Solitary pulmonary nodule: Secondary | ICD-10-CM | POA: Diagnosis not present

## 2021-11-12 DIAGNOSIS — F17210 Nicotine dependence, cigarettes, uncomplicated: Secondary | ICD-10-CM | POA: Diagnosis not present

## 2021-11-12 DIAGNOSIS — J3089 Other allergic rhinitis: Secondary | ICD-10-CM | POA: Diagnosis not present

## 2021-11-12 DIAGNOSIS — J449 Chronic obstructive pulmonary disease, unspecified: Secondary | ICD-10-CM | POA: Diagnosis not present

## 2021-11-12 DIAGNOSIS — I10 Essential (primary) hypertension: Secondary | ICD-10-CM | POA: Diagnosis not present

## 2021-11-13 DIAGNOSIS — J449 Chronic obstructive pulmonary disease, unspecified: Secondary | ICD-10-CM | POA: Diagnosis not present

## 2021-11-13 DIAGNOSIS — D696 Thrombocytopenia, unspecified: Secondary | ICD-10-CM | POA: Diagnosis not present

## 2021-11-13 DIAGNOSIS — D72829 Elevated white blood cell count, unspecified: Secondary | ICD-10-CM | POA: Diagnosis not present

## 2021-11-13 DIAGNOSIS — C210 Malignant neoplasm of anus, unspecified: Secondary | ICD-10-CM | POA: Diagnosis not present

## 2021-11-13 DIAGNOSIS — F17210 Nicotine dependence, cigarettes, uncomplicated: Secondary | ICD-10-CM | POA: Diagnosis not present

## 2021-11-13 DIAGNOSIS — D735 Infarction of spleen: Secondary | ICD-10-CM | POA: Diagnosis not present

## 2021-11-13 DIAGNOSIS — Z95828 Presence of other vascular implants and grafts: Secondary | ICD-10-CM | POA: Diagnosis not present

## 2021-11-15 DIAGNOSIS — R0609 Other forms of dyspnea: Secondary | ICD-10-CM | POA: Diagnosis not present

## 2021-11-15 DIAGNOSIS — I2510 Atherosclerotic heart disease of native coronary artery without angina pectoris: Secondary | ICD-10-CM | POA: Diagnosis not present

## 2021-11-15 DIAGNOSIS — I4891 Unspecified atrial fibrillation: Secondary | ICD-10-CM | POA: Diagnosis not present

## 2021-11-15 DIAGNOSIS — I480 Paroxysmal atrial fibrillation: Secondary | ICD-10-CM | POA: Diagnosis not present

## 2021-11-15 DIAGNOSIS — I10 Essential (primary) hypertension: Secondary | ICD-10-CM | POA: Diagnosis not present

## 2021-11-18 ENCOUNTER — Ambulatory Visit
Admission: RE | Admit: 2021-11-18 | Discharge: 2021-11-18 | Disposition: A | Discharge status: Home / Self Care | Payer: BC Managed Care – PPO | Source: Ambulatory Visit | Attending: Family Medicine | Admitting: Family Medicine

## 2021-11-18 ENCOUNTER — Other Ambulatory Visit: Payer: Self-pay

## 2021-11-18 ENCOUNTER — Ambulatory Visit (INDEPENDENT_AMBULATORY_CARE_PROVIDER_SITE_OTHER): Payer: BC Managed Care – PPO

## 2021-11-18 VITALS — BP 158/83 | HR 90 | Temp 98.5°F | Resp 18 | Ht 67.0 in | Wt 120.0 lb

## 2021-11-18 DIAGNOSIS — S22000A Wedge compression fracture of unspecified thoracic vertebra, initial encounter for closed fracture: Secondary | ICD-10-CM | POA: Diagnosis not present

## 2021-11-18 DIAGNOSIS — M546 Pain in thoracic spine: Secondary | ICD-10-CM

## 2021-11-18 DIAGNOSIS — M549 Dorsalgia, unspecified: Secondary | ICD-10-CM | POA: Diagnosis not present

## 2021-11-18 MED ORDER — CALCITONIN (SALMON) 200 UNIT/ACT NA SOLN: NASAL | 0 refills | Status: DC

## 2021-11-18 NOTE — ED Provider Notes (Signed)
[facility]    CSN: [id number] Arrival date & time: 11/18/21  1433      History   Chief Complaint Chief Complaint  Patient presents with   Back Pain    Need X-ray- pulled muscles? - Entered by patient    HPI Sue Smith is a 66 y.o. female.   About 1.5 weeks ago patient lifted her young grand-daughter and experienced sudden brief sharp pain in her upper mid-back.  The pain has persisted with bilateral radiation beneath her scapulae.  The pain is somewhat worse at night and not improved with Percocet.  The history is provided by the patient.  Back Pain Location:  Thoracic spine Quality:  Aching Pain severity:  Moderate Pain is:  Same all the time Onset quality:  Sudden Duration:  10 days Timing:  Constant Progression:  Unchanged Chronicity:  New Context: lifting heavy objects   Context: not recent injury   Relieved by:  Nothing Worsened by:  Deep breathing, twisting and coughing Ineffective treatments:  Narcotics Associated symptoms: no abdominal pain and no chest pain   Risk factors: hx of osteoporosis and menopause     Past Medical History:  Diagnosis Date   Cervical dysplasia    COPD (chronic obstructive pulmonary disease) (HCC)    Hyperlipidemia    Hypertension    Osteoporosis     Patient Active Problem List   Diagnosis Date Noted   Leukocytosis 05/01/2021   Unintended weight loss 05/01/2021   Hypocalcemia 04/30/2021   Clostridium difficile colitis 04/30/2021   Drug-induced constipation 03/26/2021   Common bile duct dilation 03/26/2021   History of rectal or anal cancer 03/26/2021   Colitis 03/26/2021   Anal cancer ([place]) 10/18/2020   Anal lesion 07/10/2020   Atrial fibrillation with RVR ([place]) 11/16/2019   COVID-19 virus infection 11/16/2019   Irregular heart rhythm 05/06/2019   PAC (premature atrial contraction) 05/06/2019   PVC (premature ventricular contraction) 05/06/2019   No energy 07/29/2018   Grief 06/30/2017   Current  smoker 02/26/2017   Actinic keratosis 11/29/2016   Chronic pain syndrome 11/29/2016   Gastroesophageal reflux disease 11/29/2016   Simple chronic bronchitis ([place]) 08/27/2016   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   Insomnia 11/25/2013   DDD (degenerative disc disease), lumbar 03/23/2013   Osteoporosis 11/25/2012   Hyperlipidemia 11/25/2012   Personal history of colonic polyps 03/26/2012   Pre-diabetes 11/20/2011   Vasomotor flushing 11/18/2011   Essential hypertension 11/04/2011   Surgical menopause 11/04/2011    Past Surgical History:  Procedure Laterality Date   ABDOMINAL HYSTERECTOMY     APPENDECTOMY     TOTAL ABDOMINAL HYSTERECTOMY W/ BILATERAL SALPINGOOPHORECTOMY      OB History   No obstetric history on file.      Home Medications    Prior to Admission medications   Medication Sig Start Date End Date Taking? Authorizing Provider  albuterol (PROVENTIL HFA;VENTOLIN HFA) 108 (90 Base) MCG/ACT inhaler Inhale 2 puffs into the lungs every 6 (six) hours as needed for wheezing. 11/04/17  Yes Breeback, Jade L, PA-C  alendronate (FOSAMAX) 70 MG tablet TAKE ONE TABLET BY MOUTH ONCE A WEEK, TAKE WITH A FULL GLASS OF WATER ON AN EMPTY STOMACH 07/10/20  Yes Breeback, Jade L, PA-C  AMBULATORY NON FORMULARY MEDICATION One nebulizer machine for COPD management. 02/26/18  Yes Breeback, Jade L, PA-C  amLODipine (NORVASC) 2.5 MG tablet Take 1 tablet (2.5 mg total) by mouth daily. 09/14/21  Yes Breeback, Jade L,  PA-C  atorvastatin (LIPITOR) 20 MG tablet Take 1 tablet (20 mg total) by mouth daily. 12/19/20  Yes Breeback, Jade L, PA-C  calcitonin, salmon, (MIACALCIN) 200 UNIT/ACT nasal spray Place one spray in one nostril daily. Alternate nostrils with each spray. 11/18/21  Yes Kandra Nicolas, MD  Cholecalciferol 25 MCG (1000 UT) tablet Take by mouth.   Yes [provider]  ipratropium-albuterol (DUONEB) 0.5-2.5 (3) MG/3ML SOLN Take 3 mLs by  nebulization every 4 (four) hours as needed. 02/04/18  Yes Breeback, Jade L, PA-C  Lactobacillus Acid-Pectin (ACIDOPHILUS/PECTIN) CAPS Take by mouth. 04/25/21  Yes [provider]  loratadine (CLARITIN) 10 MG tablet Take 1 tablet (10 mg total) by mouth daily. 09/14/21  Yes Breeback, Jade L, PA-C  losartan (COZAAR) 100 MG tablet Take 1 tablet (100 mg total) by mouth daily. 03/20/21  Yes Breeback, Jade L, PA-C  montelukast (SINGULAIR) 10 MG tablet Take 1 tablet (10 mg total) by mouth at bedtime. 03/20/21  Yes Breeback, Jade L, PA-C  ondansetron (ZOFRAN) 8 MG tablet Take 8 mg by mouth every 8 (eight) hours as needed. 05/23/21  Yes [provider]  oxyCODONE-acetaminophen (PERCOCET) 10-325 MG tablet Take 1 tablet by mouth every 6 (six) hours as needed for pain. 09/14/21  Yes Breeback, Jade L, PA-C  oxyCODONE-acetaminophen (PERCOCET) 10-325 MG tablet Take 1 tablet by mouth every 6 (six) hours as needed for pain. 10/13/21  Yes Breeback, Jade L, PA-C  oxyCODONE-acetaminophen (PERCOCET) 10-325 MG tablet Take 1 tablet by mouth every 6 (six) hours as needed for pain. 11/12/21  Yes Breeback, Jade L, PA-C  pantoprazole (PROTONIX) 40 MG tablet Take 40 mg by mouth 2 (two) times daily. 04/07/21  Yes [provider]  promethazine (PHENERGAN) 25 MG tablet Take 1 tablet (25 mg total) by mouth every 6 (six) hours as needed for nausea or vomiting. 05/08/21  Yes Breeback, Jade L, PA-C  TRELEGY ELLIPTA 100-62.5-25 MCG/INH AEPB INHALE 1 PUFF ONCE DAILY 01/18/19  Yes Breeback, Jade L, PA-C    Family History Family History  Problem Relation Age of Onset   Cancer Mother    Heart failure Father    CAD Father     Social History Social History   Tobacco Use   Smoking status: Every Day    Packs/day: 0.25    Types: Cigarettes    Last attempt to quit: 11/02/2019    Years since quitting: 2.0   Smokeless tobacco: Never  Vaping Use   Vaping Use: Never used  Substance Use Topics   Alcohol use: Never    Drug use: No     Allergies   Ampicillin, Erythromycin, Keflex [cephalexin], Penicillins, Sulfa antibiotics, Doxycycline, Tetracyclines & related, Chantix [varenicline], and Hctz [hydrochlorothiazide]   Review of Systems Review of Systems  Constitutional: Negative.   HENT: Negative.    Eyes: Negative.   Respiratory:  Negative for cough, shortness of breath, wheezing and stridor.   Cardiovascular:  Negative for chest pain and leg swelling.  Gastrointestinal:  Negative for abdominal pain.  Genitourinary: Negative.   Musculoskeletal:  Positive for back pain.  Skin:  Negative for rash.  Neurological: Negative.      Physical Exam Triage Vital Signs ED Triage Vitals  Enc Vitals Group     BP 11/18/21 1453 (!) 158/83     Pulse Rate 11/18/21 1453 90     Resp 11/18/21 1453 18     Temp 11/18/21 1453 98.5 F (36.9 C)     Temp Source 11/18/21 1453  Oral     SpO2 11/18/21 1453 93 %     Weight 11/18/21 1450 120 lb (54.4 kg)     Height 11/18/21 1450 '5\' 7"'$  (1.702 m)     Head Circumference --      Peak Flow --      Pain Score 11/18/21 1450 5     Pain Loc --      Pain Edu? --      Excl. in [place]? --    No data found.  Updated Vital Signs BP (!) 158/83 (BP Location: Right Arm)   Pulse 90   Temp 98.5 F (36.9 C) (Oral)   Resp 18   Ht '5\' 7"'$  (1.702 m)   Wt 54.4 kg   SpO2 93%   BMI 18.79 kg/m   Visual Acuity Right Eye Distance:   Left Eye Distance:   Bilateral Distance:    Right Eye Near:   Left Eye Near:    Bilateral Near:     Physical Exam Vitals and nursing note reviewed.  Constitutional:      General: She is not in acute distress. HENT:     Head: Normocephalic.     Mouth/Throat:     Mouth: Mucous membranes are moist.  Eyes:     Pupils: Pupils are equal, round, and reactive to light.  Cardiovascular:     Rate and Rhythm: Normal rate and regular rhythm.     Heart sounds: Normal heart sounds.  Pulmonary:     Breath sounds: Normal breath sounds.       Comments:  There is distinct midline vertebral point tenderness to palpation as noted on diagram.  Patient experiences bilateral scapular pain but there is no tenderness to palpation at these locations.   Chest:     Chest wall: No tenderness.  Abdominal:     Palpations: Abdomen is soft.     Tenderness: There is no abdominal tenderness.  Musculoskeletal:        General: No tenderness.     Right lower leg: No edema.     Left lower leg: No edema.  Lymphadenopathy:     Cervical: No cervical adenopathy.  Skin:    General: Skin is warm and dry.     Findings: No rash.  Neurological:     General: No focal deficit present.     Mental Status: She is alert.      UC Treatments / Results  Labs (all labs ordered are listed, but only abnormal results are displayed) Labs Reviewed - No data to display  EKG   Radiology DG Thoracic Spine W/Swimmers  Result Date: 11/18/2021 CLINICAL DATA:  Back pain EXAM: THORACIC SPINE - 3 VIEWS COMPARISON:  Chest radiograph done on 06/12/2021 FINDINGS: There is new compression fracture in the body of T8 vertebra. These 50-60% decrease in height of upper endplate. Alignment of posterior margins of vertebral bodies is unremarkable. Increase in AP diameter of chest suggests COPD. Tip of right IJ chest port is seen in superior vena cava. IMPRESSION: There is new 50-60% decrease in height of body of T8 vertebra suggesting recent compression fracture. Alignment of posterior margins of vertebral bodies appears normal. COPD. Electronically Signed   By: Elmer Picker M.D.   On: 11/18/2021 16:37    Procedures Procedures (including critical care time)  Medications Ordered in UC Medications - No data to display  Initial Impression / Assessment and Plan / UC Course  I have reviewed the triage vital signs and the nursing notes.  Pertinent labs & imaging results that were available during my care of the patient were reviewed by me and considered in my medical decision making  (see chart for details).    Will begin calcitonin-salmon nasal spray (one spray daily) for improved vertebral pain relief.  Recommend that she stop Fosamax while taking calcitonin-salmon spray. Followup with Family Doctor as soon as possible. Patient may be a candidate for kyphoplasty.  Recommend follow-up with Dr. Emelda Brothers at [facility].  Final Clinical Impressions(s) / UC Diagnoses   Final diagnoses:  Acute midline thoracic back pain  Compression fracture of body of thoracic vertebra [facility])     Discharge Instructions      Stop Fosamax while taking calcitonin-salmon nasal spray. Ensure adequate intake of vitamin D and calcium.     ED Prescriptions     Medication Sig Dispense Auth. Provider   calcitonin, salmon, (MIACALCIN) 200 UNIT/ACT nasal spray Place one spray in one nostril daily. Alternate nostrils with each spray. 3.7 mL Kandra Nicolas, MD         Kandra Nicolas, MD 11/19/21 1007

## 2021-11-18 NOTE — ED Triage Notes (Signed)
Back pain. X1 week

## 2021-11-18 NOTE — Discharge Instructions (Addendum)
Stop Fosamax while taking calcitonin-salmon nasal spray. Ensure adequate intake of vitamin D and calcium.

## 2021-11-20 ENCOUNTER — Encounter: Payer: Self-pay | Admitting: Physician Assistant

## 2021-11-20 ENCOUNTER — Telehealth: Payer: Self-pay

## 2021-11-20 ENCOUNTER — Ambulatory Visit: Payer: BC Managed Care – PPO | Admitting: Physician Assistant

## 2021-11-20 VITALS — BP 122/78 | HR 107 | Ht 67.0 in | Wt 125.0 lb

## 2021-11-20 DIAGNOSIS — G894 Chronic pain syndrome: Secondary | ICD-10-CM | POA: Diagnosis not present

## 2021-11-20 DIAGNOSIS — M8080XD Other osteoporosis with current pathological fracture, unspecified site, subsequent encounter for fracture with routine healing: Secondary | ICD-10-CM

## 2021-11-20 DIAGNOSIS — M5136 Other intervertebral disc degeneration, lumbar region: Secondary | ICD-10-CM | POA: Diagnosis not present

## 2021-11-20 DIAGNOSIS — S22000A Wedge compression fracture of unspecified thoracic vertebra, initial encounter for closed fracture: Secondary | ICD-10-CM | POA: Diagnosis not present

## 2021-11-20 DIAGNOSIS — F17200 Nicotine dependence, unspecified, uncomplicated: Secondary | ICD-10-CM

## 2021-11-20 MED ORDER — CYCLOBENZAPRINE HCL 10 MG PO TABS: 10.0000 mg | ORAL_TABLET | Freq: Three times a day (TID) | ORAL | 0 refills | Status: DC | PRN

## 2021-11-20 MED ORDER — DICLOFENAC SODIUM 1 % EX GEL: 4.0000 g | Freq: Four times a day (QID) | CUTANEOUS | 1 refills | Status: DC

## 2021-11-20 MED ORDER — AMBULATORY NON FORMULARY MEDICATION: 0 refills | Status: DC

## 2021-11-20 NOTE — Progress Notes (Signed)
Acute Office Visit  Subjective:     Patient ID: Markeita Alicia, female    DOB: Feb 28, 1955, 66 y.o.   MRN: [id number]  Chief Complaint  Patient presents with   Hospitalization Follow-up    HPI Patient is in today for follow up on ED visit for compression fracture of T8 on 11/5. She has had pain for about a week when she went to ED. She thought it was a muscle sprain but xray showed compression fracture. She was given calcitonin nasal spray and told to follow up with PCP. Pt does have osteoporosis and on fosamax. She continues to smoke and on daily prednisone. She is in a lot of pain 10/10 and on oxycodone TID. Heat does seem to help.    She does have upcoming stress test and echocardiogram.   .. 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   Simple chronic bronchitis ([place]) 08/27/2016   Actinic keratosis 11/29/2016   Chronic pain syndrome 11/29/2016   Gastroesophageal reflux disease 11/29/2016   Current smoker 02/26/2017   Grief 06/30/2017   No energy 07/29/2018   Irregular heart rhythm 05/06/2019   PAC (premature atrial contraction) 05/06/2019   PVC (premature ventricular contraction) 05/06/2019   Atrial fibrillation with RVR ([place]) 11/16/2019   COVID-19 virus infection 11/16/2019   Anal lesion 07/10/2020   Anal cancer ([place]) 10/18/2020   Drug-induced constipation 03/26/2021   Common bile duct dilation 03/26/2021   History of rectal or anal cancer 03/26/2021   Colitis 03/26/2021   Hypocalcemia 04/30/2021   Clostridium difficile colitis 04/30/2021   Leukocytosis 05/01/2021    Unintended weight loss 05/01/2021   Compression fracture of body of thoracic vertebra ([place]) 11/20/2021   Resolved Ambulatory Problems    Diagnosis Date Noted   Post-viral cough syndrome 11/16/2019   Past Medical History:  Diagnosis Date   Cervical dysplasia    COPD (chronic obstructive pulmonary disease) ([place])    Hypertension     ROS See HPI.      Objective:    BP 122/78   Pulse (!) 107   Ht '5\' 7"'$  (1.702 m)   Wt 125 lb (56.7 kg)   SpO2 92% Comment: pt is on oxygen  BMI 19.58 kg/m  BP Readings from Last 3 Encounters:  11/20/21 122/78  11/18/21 (!) 158/83  09/14/21 (!) 140/73   Wt Readings from Last 3 Encounters:  11/20/21 125 lb (56.7 kg)  11/18/21 120 lb (54.4 kg)  09/14/21 116 lb (52.6 kg)      Physical Exam Constitutional:      Appearance: Normal appearance.  HENT:     Head: Normocephalic.  Cardiovascular:     Rate and Rhythm: Normal rate and regular rhythm.     Pulses: Normal pulses.     Heart sounds: Normal heart sounds.  Pulmonary:     Effort: Pulmonary effort is normal.  Musculoskeletal:     Right lower leg: No edema.     Left lower leg: No edema.     Comments: Tenderness to palpation over T8. Tight paraspinal muscles.   Neurological:     General: No focal deficit present.  Mental Status: She is alert and oriented to person, place, and time.  Psychiatric:        Mood and Affect: Mood normal.          Assessment & Plan:  Marland KitchenMarland KitchenArriyana was seen today for hospitalization follow-up.  Diagnoses and all orders for this visit:  Compression fracture of body of thoracic vertebra (HCC) -     cyclobenzaprine (FLEXERIL) 10 MG tablet; Take 1 tablet (10 mg total) by mouth 3 (three) times daily as needed for muscle spasms. -     diclofenac Sodium (VOLTAREN) 1 % GEL; Apply 4 g topically 4 (four) times daily. To affected joint. -     AMBULATORY NON FORMULARY MEDICATION; Thoracic orthosis worn throughout the day for compression fracture T8. -     Ambulatory  referral to Physical Therapy  Localized osteoporosis with current pathological fracture with routine healing, subsequent encounter -     Ambulatory referral to Physical Therapy  Chronic pain syndrome  DDD (degenerative disc disease), lumbar  Current smoker   Discussed compression fracture and her overall risk for more fractures Discussed spine health and prevention Will order formal PT for patient for rehab.  Pt encouraged to stop smoking  Hold fosamax while using miacalcin then start back on fosamax.  Declined osteoporosis referral for now. May consider in future.  Declined orthopedic referral for now.  Ordered thoracic brace to use as needed for pain and strength control Consider icy hot patches/heat pads Continue on vitamin D and calcium  Flexeril as needed Do not lift more than 5-10lbs Keep good posture while healing  Pt already on pain contract. No changes made today.   Follow up in 4 weeks.   Iran Planas, PA-C

## 2021-11-20 NOTE — Telephone Encounter (Signed)
Called [facility] tele:[phone] in regards to getting a thoracic orthosis  for a compression fracture located in t8, rep states that there might be a lumbar   wrap located in urgent care she also states she will send [place] a spine catalog of some  braces  that might fir the pt she also states she will have to order the brace from the where house, awaiting email.

## 2021-11-20 NOTE — Patient Instructions (Addendum)
PT referral Spinal Compression Fracture  A spinal compression fracture is a collapse of the bones that form the spine (vertebrae). With this type of fracture, the vertebrae become pushed (compressed) into a wedge shape. Most compression fractures happen in the middle or lower part of the spine. What are the causes? This condition may be caused by: Thinning and loss of density in the bones (osteoporosis). This is the most common cause. A fall. A car or motorcycle accident. Cancer. Trauma, such as a heavy, direct hit to the head or back. What increases the risk? You are more likely to develop this condition if: You are 87 years of age or older. You have osteoporosis. You have certain types of cancer, including: Multiple myeloma. Lymphoma. Prostate cancer. Lung cancer. Breast cancer. What are the signs or symptoms? Symptoms of this condition include: Severe pain with simple movements such as coughing or sneezing. Pain that gets worse over time. Pain that is worse when you stand, walk, sit, or bend. Sudden pain that is so bad that it is hard for you to move. Bending or humping of the spine. Gradual loss of height. Numbness, tingling, or weakness in the back and legs. Trouble walking. Your symptoms will depend on the cause of the fracture and how quickly it develops. How is this diagnosed? This condition may be diagnosed based on symptoms, medical history, and a physical exam. During the physical exam, your health care provider may tap along the length of your spine to check for tenderness. Tests may be done to confirm the diagnosis. They may include: A bone mineral density test to check for osteoporosis. Imaging tests, such as a spine X-ray, CT scan, or MRI. How is this treated? Treatment depends on the cause and severity of the condition. Some fractures may heal on their own with supportive care. Treatment may include: Pain medicine. Rest. A back brace. Physical therapy  exercises. Medicine to strengthen bone. Calcium and vitamin D supplements. Fractures that cause the back to become misshapen, cause nerve pain or weakness, or do not respond to other treatment may be treated with surgery. This may include: Vertebroplasty. Bone cement is injected into the collapsed vertebrae to stabilize them. Balloon kyphoplasty. The collapsed vertebrae are expanded with a balloon and then bone cement is injected into them. Spinal fusion. The collapsed vertebrae are connected (fused) to normal vertebrae. Follow these instructions at home: Medicines Take over-the-counter and prescription medicines only as told by your health care provider. Ask your health care provider if the medicine prescribed to you: Requires you to avoid driving or using machinery. Can cause constipation. You may need to take these actions to prevent or treat constipation: Drink enough fluid to keep your urine pale yellow. Take over-the-counter or prescription medicines. Eat foods that are high in fiber, such as beans, whole grains, and fresh fruits and vegetables. Limit foods that are high in fat and processed sugars, such as fried or sweet foods. If you have a brace: Wear the brace as told by your health care provider. Remove it only as told by your health care provider. Loosen the brace if your fingers or toes tingle, become numb, or turn cold and blue. Keep the brace clean. If the brace is not waterproof: Do not let it get wet. Cover it with a watertight covering when you take a bath or a shower. Managing pain, stiffness, and swelling  If directed, put ice on the injured area. To do this: If you have a removable brace, remove  it as told by your health care provider. Put ice in a plastic bag. Place a towel between your skin and the bag. Leave the ice on for 20 minutes, 2-3 times a day. Remove the ice if your skin turns bright red. This is very important. If you cannot feel pain, heat, or cold,  you have a greater risk of damage to the area. Activity Rest as told by your health care provider. Avoid sitting for a long time without moving. Get up to take short walks every 1-2 hours. This is important to improve blood flow and breathing. Ask for help if you feel weak or unsteady. Return to your normal activities as told by your health care provider. Ask what activities are safe for you. Do physical therapy exercises to improve movement and strength in your back, as recommended by your health care provider. Exercise regularly as directed by your health care provider. General instructions  Do not drink alcohol. Alcohol can interfere with your treatment. Do not use any products that contain nicotine or tobacco, such as cigarettes, e-cigarettes, and chewing tobacco. These can delay bone healing. If you need help quitting, ask your health care provider. Keep all follow-up visits. This is important. It can help to prevent permanent injury, disability, and long-lasting (chronic) pain. Contact a health care provider if: You have a fever. Your pain medicine is not helping. Your pain does not get better over time. You cannot return to your normal activities as planned or expected. Get help right away if: Your pain is very bad and it suddenly gets worse. You are unable to move any body part (paralysis) that is below the level of your injury. You have numbness, tingling, or weakness in any body part that is below the level of your injury. You cannot control your bladder or bowels. Summary A spinal compression fracture is a collapse of the bones that form the spine (vertebrae). With this type of fracture, the vertebrae become pushed (compressed) into a wedge shape. Your symptoms and treatment will depend on the cause and severity of the fracture and how quickly it develops. Some fractures may heal on their own with supportive care. Fractures that cause the back to become misshapen, cause nerve  pain or weakness, or do not respond to other treatment may be treated with surgery. This information is not intended to replace advice given to you by your health care provider. Make sure you discuss any questions you have with your health care provider. Document Revised: 04/21/2019 Document Reviewed: 04/21/2019 Elsevier Patient Education  [place].

## 2021-11-21 ENCOUNTER — Other Ambulatory Visit: Payer: Self-pay | Admitting: Physician Assistant

## 2021-11-21 DIAGNOSIS — Z1231 Encounter for screening mammogram for malignant neoplasm of breast: Secondary | ICD-10-CM

## 2021-11-22 NOTE — Telephone Encounter (Signed)
Called [facility] in [place] and confirmed they do braces for the thoracic spine. Faxed order and demographics to them and they state they will reach out to patient (fax # [phone]). Patient made aware. She will call us back if she does not hear from them. Tucker.

## 2021-11-27 DIAGNOSIS — I4891 Unspecified atrial fibrillation: Secondary | ICD-10-CM | POA: Diagnosis not present

## 2021-11-27 DIAGNOSIS — I480 Paroxysmal atrial fibrillation: Secondary | ICD-10-CM | POA: Diagnosis not present

## 2021-11-27 DIAGNOSIS — R0609 Other forms of dyspnea: Secondary | ICD-10-CM | POA: Diagnosis not present

## 2021-11-28 ENCOUNTER — Ambulatory Visit: Payer: BC Managed Care – PPO | Admitting: Rehabilitative and Restorative Service Providers"

## 2021-11-29 DIAGNOSIS — S22060A Wedge compression fracture of T7-T8 vertebra, initial encounter for closed fracture: Secondary | ICD-10-CM | POA: Diagnosis not present

## 2021-12-03 NOTE — Therapy (Incomplete)
OUTPATIENT PHYSICAL THERAPY THORACOLUMBAR EVALUATION   Patient Name: Sue Smith MRN: [id number] DOB:[birth year], 66 y.o., female Today's Date: 12/03/2021  END OF SESSION:   Past Medical History:  Diagnosis Date   Cervical dysplasia    COPD (chronic obstructive pulmonary disease) ([place])    Hyperlipidemia    Hypertension    Osteoporosis    Past Surgical History:  Procedure Laterality Date   ABDOMINAL HYSTERECTOMY     APPENDECTOMY     TOTAL ABDOMINAL HYSTERECTOMY W/ BILATERAL SALPINGOOPHORECTOMY     Patient Active Problem List   Diagnosis Date Noted   Compression fracture of body of thoracic vertebra ([place]) 11/20/2021   Leukocytosis 05/01/2021   Unintended weight loss 05/01/2021   Hypocalcemia 04/30/2021   Clostridium difficile colitis 04/30/2021   Drug-induced constipation 03/26/2021   Common bile duct dilation 03/26/2021   History of rectal or anal cancer 03/26/2021   Colitis 03/26/2021   Anal cancer ([place]) 10/18/2020   Anal lesion 07/10/2020   Atrial fibrillation with RVR ([place]) 11/16/2019   COVID-19 virus infection 11/16/2019   Irregular heart rhythm 05/06/2019   PAC (premature atrial contraction) 05/06/2019   PVC (premature ventricular contraction) 05/06/2019   No energy 07/29/2018   Grief 06/30/2017   Current smoker 02/26/2017   Actinic keratosis 11/29/2016   Chronic pain syndrome 11/29/2016   Gastroesophageal reflux disease 11/29/2016   Simple chronic bronchitis ([place]) 08/27/2016   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   Insomnia 11/25/2013   DDD (degenerative disc disease), lumbar 03/23/2013   Osteoporosis 11/25/2012   Hyperlipidemia 11/25/2012   Personal history of colonic polyps 03/26/2012   Pre-diabetes 11/20/2011   Vasomotor flushing 11/18/2011   Essential hypertension 11/04/2011   Surgical menopause 11/04/2011    PCP: Lavada Mesi   REFERRING PROVIDER: Donella Stade, PA-C   REFERRING  DIAG: S22.000A (ICD-10-CM) - Compression fracture of body of thoracic vertebra (HCC)  M80.80XD (ICD-10-CM) - Localized osteoporosis with current pathological fracture with routine healing, subsequent encounter   Rationale for Evaluation and Treatment: Rehabilitation  THERAPY DIAG:  No diagnosis found.  ONSET DATE: ***  SUBJECTIVE:                                                                                                                                                                                           SUBJECTIVE STATEMENT: ***  PERTINENT HISTORY:  HTN, OP, DDD, hysterectomy  PAIN:  Are you having pain? Yes: NPRS scale: ***/10 Pain location: *** Pain description: *** Aggravating factors: *** Relieving factors: ***  PRECAUTIONS: Other: OSTEOPOROSIS  WEIGHT BEARING RESTRICTIONS: No  FALLS:  Has patient fallen  in last 6 months? {fallsyesno:27318}  LIVING ENVIRONMENT: Lives with: {OPRC lives with:25569::"lives with their family"} Lives in: {Lives in:25570} Stairs: {opstairs:27293} Has following equipment at home: {Assistive devices:23999}  OCCUPATION: ***  PLOF: {PLOF:24004}  PATIENT GOALS: ***  NEXT MD VISIT:   OBJECTIVE:   DIAGNOSTIC FINDINGS:  XRAY 11/18/21: IMPRESSION: There is new 50-60% decrease in height of body of T8 vertebra suggesting recent compression fracture. Alignment of posterior margins of vertebral bodies appears normal.   COPD.  PATIENT SURVEYS:  FOTO ***  SCREENING FOR RED FLAGS: Bowel or bladder incontinence: No Spinal tumors: No Cauda equina syndrome: No Compression fracture: Yes: *** Abdominal aneurysm: No  COGNITION: Overall cognitive status: Within functional limits for tasks assessed     SENSATION: {sensation:27233}  MUSCLE LENGTH: HS: Quads: ITB: Piriformis: Hip Flexors: Heelcords:   POSTURE: {posture:25561}  PALPATION: Palpation: TTP at ***. Increased tissue tension in *** Spinal  Mobility:      LUMBAR ROM:   AROM eval  Flexion   Extension   Right lateral flexion   Left lateral flexion   Right rotation   Left rotation    (Blank rows = not tested)  LOWER EXTREMITY ROM:     Active  Right eval Left eval  Hip flexion    Hip extension    Hip abduction    Hip adduction    Hip internal rotation    Hip external rotation    Knee flexion    Knee extension    Ankle dorsiflexion    Ankle plantarflexion    Ankle inversion    Ankle eversion     (Blank rows = not tested)  LOWER EXTREMITY MMT:    MMT Right eval Left eval  Hip flexion    Hip extension    Hip abduction    Hip adduction    Hip internal rotation    Hip external rotation    Knee flexion    Knee extension    Ankle dorsiflexion    Ankle plantarflexion    Ankle inversion    Ankle eversion     (Blank rows = not tested)  LUMBAR SPECIAL TESTS:  {lumbar special test:25242}  FUNCTIONAL TESTS:  5 times sit to stand: ***  GAIT: Distance walked: *** Assistive device utilized: {Assistive devices:23999} Level of assistance: {Levels of assistance:24026} Comments: ***  TODAY'S TREATMENT:                                                                                                                              DATE: ***  12/04/21 See Pt ed  PATIENT EDUCATION:  Education details: PT eval findings, anticipated POC, initial HEP, and ***' Person educated: Patient Education method: Explanation, Demonstration, Verbal cues, and Handouts Education comprehension: verbalized understanding and returned demonstration  HOME EXERCISE PROGRAM: ***  ASSESSMENT:  CLINICAL IMPRESSION: Patient is a 66 y.o. female who was seen today for physical therapy evaluation and treatment for back pain due to T8 compression  fracture. *** .   OBJECTIVE IMPAIRMENTS: {opptimpairments:25111}.   ACTIVITY LIMITATIONS: {activitylimitations:27494}  PARTICIPATION LIMITATIONS:  {participationrestrictions:25113}  PERSONAL FACTORS: {Personal factors:25162} are also affecting patient's functional outcome.   REHAB POTENTIAL: {rehabpotential:25112}  CLINICAL DECISION MAKING: {clinical decision making:25114}  EVALUATION COMPLEXITY: {Evaluation complexity:25115}   GOALS: Goals reviewed with patient? {yes/no:20286}  SHORT TERM GOALS: Target date: ***  *** Baseline: Goal status: {GOALSTATUS:25110}  2.  *** Baseline:  Goal status: {GOALSTATUS:25110}  3.  *** Baseline:  Goal status: {GOALSTATUS:25110}  4.  *** Baseline:  Goal status: {GOALSTATUS:25110}  5.  *** Baseline:  Goal status: {GOALSTATUS:25110}  6.  *** Baseline:  Goal status: {GOALSTATUS:25110}  LONG TERM GOALS: Target date: ***  *** Baseline:  Goal status: {GOALSTATUS:25110}  2.  *** Baseline:  Goal status: {GOALSTATUS:25110}  3.  *** Baseline:  Goal status: {GOALSTATUS:25110}  4.  *** Baseline:  Goal status: {GOALSTATUS:25110}  5.  *** Baseline:  Goal status: {GOALSTATUS:25110}  6.  *** Baseline:  Goal status: {GOALSTATUS:25110}  PLAN:  PT FREQUENCY: {rehab frequency:25116}  PT DURATION: {rehab duration:25117}  PLANNED INTERVENTIONS: {rehab planned interventions:25118::"Therapeutic exercises","Therapeutic activity","Neuromuscular re-education","Balance training","Gait training","Patient/Family education","Self Care","Joint mobilization"}.  PLAN FOR NEXT SESSION: ***   Rigdon Macomber, PT 12/03/2021, 9:18 AM

## 2021-12-04 ENCOUNTER — Ambulatory Visit: Payer: BC Managed Care – PPO | Admitting: Physical Therapy

## 2021-12-06 DIAGNOSIS — J449 Chronic obstructive pulmonary disease, unspecified: Secondary | ICD-10-CM | POA: Diagnosis not present

## 2021-12-13 ENCOUNTER — Encounter: Payer: Self-pay | Admitting: Physician Assistant

## 2021-12-14 DIAGNOSIS — Z419 Encounter for procedure for purposes other than remedying health state, unspecified: Secondary | ICD-10-CM | POA: Diagnosis not present

## 2021-12-16 ENCOUNTER — Encounter: Payer: Self-pay | Admitting: Physician Assistant

## 2021-12-16 ENCOUNTER — Ambulatory Visit: Payer: BC Managed Care – PPO

## 2021-12-17 DIAGNOSIS — C210 Malignant neoplasm of anus, unspecified: Secondary | ICD-10-CM | POA: Diagnosis not present

## 2021-12-17 DIAGNOSIS — I10 Essential (primary) hypertension: Secondary | ICD-10-CM | POA: Diagnosis not present

## 2021-12-18 ENCOUNTER — Ambulatory Visit
Admission: RE | Admit: 2021-12-18 | Discharge: 2021-12-18 | Disposition: A | Discharge status: Home / Self Care | Payer: BC Managed Care – PPO | Source: Ambulatory Visit | Attending: Family Medicine | Admitting: Family Medicine

## 2021-12-18 ENCOUNTER — Ambulatory Visit: Payer: BC Managed Care – PPO | Admitting: Physician Assistant

## 2021-12-18 VITALS — BP 128/85 | HR 112 | Temp 98.6°F | Resp 30 | Ht 67.0 in | Wt 119.0 lb

## 2021-12-18 DIAGNOSIS — J441 Chronic obstructive pulmonary disease with (acute) exacerbation: Secondary | ICD-10-CM | POA: Diagnosis not present

## 2021-12-18 DIAGNOSIS — J4489 Other specified chronic obstructive pulmonary disease: Secondary | ICD-10-CM

## 2021-12-18 MED ORDER — METHYLPREDNISOLONE SODIUM SUCC 125 MG IJ SOLR
80.0000 mg | Freq: Once | INTRAMUSCULAR | Status: AC
  Administered 2021-12-18: 80 mg via INTRAMUSCULAR

## 2021-12-18 MED ORDER — PREDNISONE 20 MG PO TABS: 40.0000 mg | ORAL_TABLET | Freq: Every day | ORAL | 0 refills | Status: DC

## 2021-12-18 NOTE — Discharge Instructions (Signed)
Continue your asthma inhalers and medicine Take prednisone once a day for 5 days.  Start tomorrow See your doctor on Monday as scheduled  If you get worse at any time, please go directly to the emergency room

## 2021-12-18 NOTE — ED Provider Notes (Signed)
[facility]    CSN: [id number] Arrival date & time: 12/18/21  1352      History   Chief Complaint Chief Complaint  Patient presents with   Wheezing    HPI Sue Smith is a 66 y.o. female.   HPI  Patient has increasing shortness of breath for this last several days.  She has asthma, COPD, chronic cigarette smoking and chronic shortness of breath.  She is oxygen dependent.  She does have a history of heart failure but states she has not been retaining fluid or having any chest discomfort.  She feels more short of breath.  She states when she is in this condition she needs steroids.  She called her doctor and was unable to secure an appointment. I discussed with her that I would limit her steroid use because of her known osteoporosis and compression fractures.  She states she has not had any for many months.  Past Medical History:  Diagnosis Date   Cervical dysplasia    COPD (chronic obstructive pulmonary disease) ([place])    Hyperlipidemia    Hypertension    Osteoporosis     Patient Active Problem List   Diagnosis Date Noted   Compression fracture of body of thoracic vertebra ([place]) 11/20/2021   Leukocytosis 05/01/2021   Unintended weight loss 05/01/2021   Hypocalcemia 04/30/2021   Clostridium difficile colitis 04/30/2021   Drug-induced constipation 03/26/2021   Common bile duct dilation 03/26/2021   History of rectal or anal cancer 03/26/2021   Colitis 03/26/2021   Anal cancer ([place]) 10/18/2020   Anal lesion 07/10/2020   Atrial fibrillation with RVR ([place]) 11/16/2019   COVID-19 virus infection 11/16/2019   Irregular heart rhythm 05/06/2019   PAC (premature atrial contraction) 05/06/2019   PVC (premature ventricular contraction) 05/06/2019   No energy 07/29/2018   Grief 06/30/2017   Current smoker 02/26/2017   Actinic keratosis 11/29/2016   Chronic pain syndrome 11/29/2016   Gastroesophageal reflux disease 11/29/2016   Simple chronic bronchitis ([place])  08/27/2016   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   Insomnia 11/25/2013   DDD (degenerative disc disease), lumbar 03/23/2013   Osteoporosis 11/25/2012   Hyperlipidemia 11/25/2012   Personal history of colonic polyps 03/26/2012   Pre-diabetes 11/20/2011   Vasomotor flushing 11/18/2011   Essential hypertension 11/04/2011   Surgical menopause 11/04/2011    Past Surgical History:  Procedure Laterality Date   ABDOMINAL HYSTERECTOMY     APPENDECTOMY     TOTAL ABDOMINAL HYSTERECTOMY W/ BILATERAL SALPINGOOPHORECTOMY      OB History   No obstetric history on file.      Home Medications    Prior to Admission medications   Medication Sig Start Date End Date Taking? Authorizing Provider  predniSONE (DELTASONE) 20 MG tablet Take 2 tablets (40 mg total) by mouth daily with breakfast. 12/18/21  Yes Raylene Everts, MD  albuterol (PROVENTIL HFA;VENTOLIN HFA) 108 (90 Base) MCG/ACT inhaler Inhale 2 puffs into the lungs every 6 (six) hours as needed for wheezing. 11/04/17   Breeback, Jade L, PA-C  alendronate (FOSAMAX) 70 MG tablet TAKE ONE TABLET BY MOUTH ONCE A WEEK, TAKE WITH A FULL GLASS OF WATER ON AN EMPTY STOMACH 07/10/20   Breeback, Jade L, PA-C  AMBULATORY NON FORMULARY MEDICATION One nebulizer machine for COPD management. 02/26/18   Breeback, Jade L, PA-C  AMBULATORY NON FORMULARY MEDICATION Thoracic orthosis worn throughout the day for compression fracture T8. 11/20/21   Breeback, Royetta Car, PA-C  amLODipine (NORVASC) 2.5 MG tablet Take 1 tablet (2.5 mg total) by mouth daily. 09/14/21   Breeback, Jade L, PA-C  atorvastatin (LIPITOR) 20 MG tablet Take 1 tablet (20 mg total) by mouth daily. 12/19/20   Breeback, Luvenia Starch L, PA-C  calcitonin, salmon, (MIACALCIN) 200 UNIT/ACT nasal spray Place one spray in one nostril daily. Alternate nostrils with each spray. 11/18/21   Kandra Nicolas, MD  Cholecalciferol 25 MCG (1000 UT) tablet Take by mouth.    [provider]  cyclobenzaprine (FLEXERIL) 10 MG tablet Take 1 tablet (10 mg total) by mouth 3 (three) times daily as needed for muscle spasms. 11/20/21   Breeback, Jade L, PA-C  diclofenac Sodium (VOLTAREN) 1 % GEL Apply 4 g topically 4 (four) times daily. To affected joint. 11/20/21   Breeback, Jade L, PA-C  ipratropium-albuterol (DUONEB) 0.5-2.5 (3) MG/3ML SOLN Take 3 mLs by nebulization every 4 (four) hours as needed. 02/04/18   Breeback, Luvenia Starch L, PA-C  Lactobacillus Acid-Pectin (ACIDOPHILUS/PECTIN) CAPS Take by mouth. 04/25/21   [provider]  loratadine (CLARITIN) 10 MG tablet Take 1 tablet (10 mg total) by mouth daily. 09/14/21   Breeback, Jade L, PA-C  losartan (COZAAR) 100 MG tablet Take 1 tablet (100 mg total) by mouth daily. 03/20/21   Breeback, Jade L, PA-C  montelukast (SINGULAIR) 10 MG tablet Take 1 tablet (10 mg total) by mouth at bedtime. 03/20/21   Breeback, Jade L, PA-C  ondansetron (ZOFRAN) 8 MG tablet Take 8 mg by mouth every 8 (eight) hours as needed. 05/23/21   [provider]  oxyCODONE-acetaminophen (PERCOCET) 10-325 MG tablet Take 1 tablet by mouth every 6 (six) hours as needed for pain. Patient not taking: Reported on 12/18/2021 09/14/21   Donella Stade, PA-C  oxyCODONE-acetaminophen (PERCOCET) 10-325 MG tablet Take 1 tablet by mouth every 6 (six) hours as needed for pain. Patient not taking: Reported on 12/18/2021 10/13/21   Donella Stade, PA-C  oxyCODONE-acetaminophen (PERCOCET) 10-325 MG tablet Take 1 tablet by mouth every 6 (six) hours as needed for pain. 11/12/21   Breeback, Jade L, PA-C  pantoprazole (PROTONIX) 40 MG tablet Take 40 mg by mouth 2 (two) times daily. 04/07/21   [provider]  promethazine (PHENERGAN) 25 MG tablet Take 1 tablet (25 mg total) by mouth every 6 (six) hours as needed for nausea or vomiting. 05/08/21   Breeback, Jade L, PA-C  TRELEGY ELLIPTA 100-62.5-25 MCG/INH AEPB INHALE 1 PUFF ONCE DAILY 01/18/19   Donella Stade, PA-C     Family History Family History  Problem Relation Age of Onset   Cancer Mother    Heart failure Father    CAD Father     Social History Social History   Tobacco Use   Smoking status: Every Day    Packs/day: 0.25    Types: Cigarettes    Last attempt to quit: 11/02/2019    Years since quitting: 2.1   Smokeless tobacco: Never  Vaping Use   Vaping Use: Never used  Substance Use Topics   Alcohol use: Never   Drug use: No     Allergies   Ampicillin, Erythromycin, Keflex [cephalexin], Penicillins, Sulfa antibiotics, Doxycycline, Tetracyclines & related, Chantix [varenicline], and Hctz [hydrochlorothiazide]   Review of Systems Review of Systems See HPI  Physical Exam Triage Vital Signs ED Triage Vitals  Enc Vitals Group     BP 12/18/21 1406 128/85     Pulse Rate 12/18/21 1406 (!) 112     Resp  12/18/21 1406 (!) 30     Temp 12/18/21 1406 98.6 F (37 C)     Temp Source 12/18/21 1406 Oral     SpO2 12/18/21 1406 97 %     Weight 12/18/21 1410 119 lb (54 kg)     Height 12/18/21 1410 '5\' 7"'$  (1.702 m)     Head Circumference --      Peak Flow --      Pain Score 12/18/21 1410 0     Pain Loc --      Pain Edu? --      Excl. in [place]? --    No data found.  Updated Vital Signs BP 128/85 (BP Location: Right Arm)   Pulse (!) 112   Temp 98.6 F (37 C) (Oral)   Resp (!) 30   Ht '5\' 7"'$  (1.702 m)   Wt 54 kg   SpO2 97%   BMI 18.64 kg/m      Physical Exam Constitutional:      General: She is not in acute distress.    Appearance: She is well-developed. She is ill-appearing.  HENT:     Head: Normocephalic and atraumatic.  Eyes:     Conjunctiva/sclera: Conjunctivae normal.     Pupils: Pupils are equal, round, and reactive to light.  Cardiovascular:     Rate and Rhythm: Regular rhythm. Tachycardia present.     Heart sounds: Normal heart sounds.  Pulmonary:     Effort: Respiratory distress present.     Breath sounds: Wheezing present.     Comments: Diminished breath  sounds throughout with scattered wheeze Abdominal:     General: There is no distension.     Palpations: Abdomen is soft.  Musculoskeletal:        General: Normal range of motion.     Cervical back: Normal range of motion.  Skin:    General: Skin is warm and dry.  Neurological:     Mental Status: She is alert.      UC Treatments / Results  Labs (all labs ordered are listed, but only abnormal results are displayed) Labs Reviewed - No data to display  EKG   Radiology No results found.  Procedures Procedures (including critical care time)  Medications Ordered in UC Medications  methylPREDNISolone sodium succinate (SOLU-MEDROL) 125 mg/2 mL injection 80 mg (80 mg Intramuscular Given 12/18/21 1430)    Initial Impression / Assessment and Plan / UC Course  I have reviewed the triage vital signs and the nursing notes.  Pertinent labs & imaging results that were available during my care of the patient were reviewed by me and considered in my medical decision making (see chart for details).     Final Clinical Impressions(s) / UC Diagnoses   Final diagnoses:  COPD (chronic obstructive pulmonary disease) with chronic bronchitis  COPD exacerbation ([place])     Discharge Instructions      Continue your asthma inhalers and medicine Take prednisone once a day for 5 days.  Start tomorrow See your doctor on Monday as scheduled  If you get worse at any time, please go directly to the emergency room    ED Prescriptions     Medication Sig Dispense Auth. Provider   predniSONE (DELTASONE) 20 MG tablet Take 2 tablets (40 mg total) by mouth daily with breakfast. 10 tablet Raylene Everts, MD      PDMP not reviewed this encounter.   Raylene Everts, MD 12/18/21 1539

## 2021-12-18 NOTE — ED Triage Notes (Addendum)
Pt has increasing SOB On O2  via [place]  3L via [place] at rest normally  Currently on 4l [place] at rest  Continuous flow w/ exertion (tank only goes up to 5L) O2 sats at  home last night were 96% Pt started diltiazem on 11/17/21 by cardiologist - stopped it 1 week ago after breathing  became worse

## 2021-12-24 ENCOUNTER — Encounter: Payer: Self-pay | Admitting: Physician Assistant

## 2021-12-24 ENCOUNTER — Ambulatory Visit (INDEPENDENT_AMBULATORY_CARE_PROVIDER_SITE_OTHER): Payer: BC Managed Care – PPO | Admitting: Physician Assistant

## 2021-12-24 VITALS — BP 138/84 | HR 112 | Ht 67.0 in | Wt 117.0 lb

## 2021-12-24 DIAGNOSIS — Z1329 Encounter for screening for other suspected endocrine disorder: Secondary | ICD-10-CM

## 2021-12-24 DIAGNOSIS — I10 Essential (primary) hypertension: Secondary | ICD-10-CM

## 2021-12-24 DIAGNOSIS — Z0000 Encounter for general adult medical examination without abnormal findings: Secondary | ICD-10-CM | POA: Diagnosis not present

## 2021-12-24 DIAGNOSIS — R7303 Prediabetes: Secondary | ICD-10-CM

## 2021-12-24 DIAGNOSIS — E782 Mixed hyperlipidemia: Secondary | ICD-10-CM

## 2021-12-24 DIAGNOSIS — M8080XD Other osteoporosis with current pathological fracture, unspecified site, subsequent encounter for fracture with routine healing: Secondary | ICD-10-CM

## 2021-12-24 DIAGNOSIS — Z79899 Other long term (current) drug therapy: Secondary | ICD-10-CM

## 2021-12-24 DIAGNOSIS — G894 Chronic pain syndrome: Secondary | ICD-10-CM

## 2021-12-24 DIAGNOSIS — B370 Candidal stomatitis: Secondary | ICD-10-CM

## 2021-12-24 DIAGNOSIS — M5136 Other intervertebral disc degeneration, lumbar region: Secondary | ICD-10-CM

## 2021-12-24 MED ORDER — FLUCONAZOLE 150 MG PO TABS: 150.0000 mg | ORAL_TABLET | Freq: Once | ORAL | 0 refills | Status: AC

## 2021-12-24 MED ORDER — PREDNISONE 20 MG PO TABS: ORAL_TABLET | ORAL | 0 refills | Status: DC

## 2021-12-24 NOTE — Patient Instructions (Signed)

## 2021-12-24 NOTE — Progress Notes (Unsigned)
Annual Wellness Visit     Patient: Sue Smith, Female    DOB: [birth year], 66 y.o.   MRN: [id number]  Subjective  Chief Complaint  Patient presents with   Annual Exam    Sue Smith is a 66 y.o. female who presents today for her Annual Wellness Visit. She reports consuming a general diet. The patient does not participate in regular exercise at present. She generally feels fairly well. She reports sleeping fairly well. She does have additional problems to discuss today.   History of COPD and being followed by pulmonology. On 12/18/2021, she went to urgent care for COPD exacerbation. She was given solumedrol injection in office and sent home on prednisone. She finished her last dose this morning. She states the steroid helped but she is still not back to baseline. She still has a cough with pale green mucus and shortness of breath. Wears 3L oxygen daily at baseline. Increases to 5 L when she becomes short of breath. Denies any chest pain.  History of Anal Cancer: GI and radiology follow up annually.  HPI     Patient Active Problem List   Diagnosis Date Noted   Thrush 12/24/2021   Compression fracture of body of thoracic vertebra ([facility]) 11/20/2021   Leukocytosis 05/01/2021   Unintended weight loss 05/01/2021   Hypocalcemia 04/30/2021   Clostridium difficile colitis 04/30/2021   Drug-induced constipation 03/26/2021   Common bile duct dilation 03/26/2021   History of rectal or anal cancer 03/26/2021   Colitis 03/26/2021   Anal cancer ([place]) 10/18/2020   Anal lesion 07/10/2020   Atrial fibrillation with RVR ([place]) 11/16/2019   COVID-19 virus infection 11/16/2019   Irregular heart rhythm 05/06/2019   PAC (premature atrial contraction) 05/06/2019   PVC (premature ventricular contraction) 05/06/2019   No energy 07/29/2018   Grief 06/30/2017   Current smoker 02/26/2017   Actinic keratosis 11/29/2016   Chronic pain syndrome 11/29/2016   Gastroesophageal reflux disease  11/29/2016   Simple chronic bronchitis ([place]) 08/27/2016   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   Insomnia 11/25/2013   DDD (degenerative disc disease), lumbar 03/23/2013   Osteoporosis 11/25/2012   Hyperlipidemia 11/25/2012   Personal history of colonic polyps 03/26/2012   Pre-diabetes 11/20/2011   Vasomotor flushing 11/18/2011   Essential hypertension 11/04/2011   Surgical menopause 11/04/2011   Allergies  Allergen Reactions   Ampicillin Hives   Erythromycin Anaphylaxis   Keflex [Cephalexin] Hives   Penicillins Hives   Sulfa Antibiotics Hives   Doxycycline Swelling   Tetracyclines & Related     Esophageal swelling   Chantix [Varenicline]     Nausea/very sick   Hctz [Hydrochlorothiazide] Rash    Medications: Outpatient Medications Prior to Visit  Medication Sig   albuterol (PROVENTIL HFA;VENTOLIN HFA) 108 (90 Base) MCG/ACT inhaler Inhale 2 puffs into the lungs every 6 (six) hours as needed for wheezing.   alendronate (FOSAMAX) 70 MG tablet TAKE ONE TABLET BY MOUTH ONCE A WEEK, TAKE WITH A FULL GLASS OF WATER ON AN EMPTY STOMACH   AMBULATORY NON FORMULARY MEDICATION One nebulizer machine for COPD management.   AMBULATORY NON FORMULARY MEDICATION Thoracic orthosis worn throughout the day for compression fracture T8.   amLODipine (NORVASC) 2.5 MG tablet Take 1 tablet (2.5 mg total) by mouth daily.   atorvastatin (LIPITOR) 20 MG tablet Take 1 tablet (20 mg total) by mouth daily.   calcitonin, salmon, (MIACALCIN) 200 UNIT/ACT nasal spray Place one spray in one nostril daily.  Alternate nostrils with each spray.   Cholecalciferol 25 MCG (1000 UT) tablet Take by mouth.   cyclobenzaprine (FLEXERIL) 10 MG tablet Take 1 tablet (10 mg total) by mouth 3 (three) times daily as needed for muscle spasms.   diclofenac Sodium (VOLTAREN) 1 % GEL Apply 4 g topically 4 (four) times daily. To affected joint.   ipratropium-albuterol (DUONEB) 0.5-2.5 (3)  MG/3ML SOLN Take 3 mLs by nebulization every 4 (four) hours as needed.   Lactobacillus Acid-Pectin (ACIDOPHILUS/PECTIN) CAPS Take by mouth.   loratadine (CLARITIN) 10 MG tablet Take 1 tablet (10 mg total) by mouth daily.   losartan (COZAAR) 100 MG tablet Take 1 tablet (100 mg total) by mouth daily.   montelukast (SINGULAIR) 10 MG tablet Take 1 tablet (10 mg total) by mouth at bedtime.   ondansetron (ZOFRAN) 8 MG tablet Take 8 mg by mouth every 8 (eight) hours as needed.   oxyCODONE-acetaminophen (PERCOCET) 10-325 MG tablet Take 1 tablet by mouth every 6 (six) hours as needed for pain.   oxyCODONE-acetaminophen (PERCOCET) 10-325 MG tablet Take 1 tablet by mouth every 6 (six) hours as needed for pain.   oxyCODONE-acetaminophen (PERCOCET) 10-325 MG tablet Take 1 tablet by mouth every 6 (six) hours as needed for pain.   pantoprazole (PROTONIX) 40 MG tablet Take 40 mg by mouth 2 (two) times daily.   predniSONE (DELTASONE) 20 MG tablet Take 2 tablets (40 mg total) by mouth daily with breakfast.   promethazine (PHENERGAN) 25 MG tablet Take 1 tablet (25 mg total) by mouth every 6 (six) hours as needed for nausea or vomiting.   TRELEGY ELLIPTA 100-62.5-25 MCG/INH AEPB INHALE 1 PUFF ONCE DAILY   No facility-administered medications prior to visit.    Allergies  Allergen Reactions   Ampicillin Hives   Erythromycin Anaphylaxis   Keflex [Cephalexin] Hives   Penicillins Hives   Sulfa Antibiotics Hives   Doxycycline Swelling   Tetracyclines & Related     Esophageal swelling   Chantix [Varenicline]     Nausea/very sick   Hctz [Hydrochlorothiazide] Rash    Patient Care Team: Lavada Mesi as PCP - General (Family Medicine)  Review of Systems  Constitutional:  Negative for fever.  Respiratory:  Positive for cough, sputum production and shortness of breath.   Cardiovascular:  Negative for chest pain and palpitations.  Gastrointestinal:  Negative for nausea and vomiting.         Objective  BP 138/84   Pulse (!) 112   Ht '5\' 7"'$  (1.702 m)   Wt 53.1 kg   SpO2 96%   BMI 18.32 kg/m    Physical Exam Constitutional:      Appearance: Normal appearance.  HENT:     Mouth/Throat:     Comments: Thrush present on tongue and hard palate. Cardiovascular:     Rate and Rhythm: Normal rate and regular rhythm.     Pulses: Normal pulses.     Heart sounds: Normal heart sounds.  Pulmonary:     Comments: Coarse breath sounds throughout Abdominal:     General: Bowel sounds are normal.  Neurological:     Mental Status: She is alert.        Most recent fall risk assessment:    12/24/2021    1:15 PM  Fall Risk   Falls in the past year? 0  Number falls in past yr: 0  Injury with Fall? 0  Risk for fall due to : No Fall Risks  Follow up Falls evaluation  completed    Most recent depression screenings:    12/24/2021    1:15 PM 09/14/2021    2:09 PM  PHQ 2/9 Scores  PHQ - 2 Score 0 0      A score of 3 or more in women, and 4 or more in men indicates increased risk for alcohol abuse, EXCEPT if all of the points are from question 1     No results found for any visits on 12/24/21.    Assessment & Plan   Annual wellness visit done today including the all of the following: Reviewed patient's Family Medical History Reviewed and updated list of patient's medical providers Assessment of cognitive impairment was done Assessed patient's functional ability Established a written schedule for health screening [place] Completed and Reviewed  Exercise Activities and Dietary recommendations  Goals      Quit Smoking        Immunization History  Administered Date(s) Administered   Influenza Split 11/03/2020   Influenza,inj,Quad PF,6+ Mos 12/01/2014, 10/11/2015, 11/26/2016, 09/30/2017, 08/30/2018, 09/14/2021   Influenza,inj,Quad PF,6-35 Mos 08/30/2018   Influenza-Unspecified 08/30/2018, 10/10/2019   Pneumococcal Conjugate-13  01/15/2007   Pneumococcal Polysaccharide-23 12/16/2018   Pneumococcal-Unspecified 12/16/2018   Tdap 01/15/2007, 07/29/2018   Zoster Recombinat (Shingrix) 08/30/2018, 11/07/2018    Health Maintenance  Topic Date Due   Lung Cancer Screening  Never done   COLONOSCOPY (Pts 45-85yr Insurance coverage will need to be confirmed)  07/01/2022   MAMMOGRAM  01/18/2023   DEXA SCAN  01/18/2023   DTaP/Tdap/Td (3 - Td or Tdap) 07/28/2028   INFLUENZA VACCINE  Completed   Hepatitis C Screening  Completed   HIV Screening  Completed   Zoster Vaccines- Shingrix  Completed   HPV VACCINES  Aged Out   PAP SMEAR-Modifier  Discontinued   COVID-19 Vaccine  Discontinued     Discussed health benefits of physical activity, and encouraged her to engage in regular exercise appropriate for her age and condition.    Problem List Items Addressed This Visit       Cardiovascular and Mediastinum   Essential hypertension - Primary (Chronic)   Relevant Orders   COMPLETE METABOLIC PANEL WITH GFR     Digestive   Thrush   Relevant Medications   fluconazole (DIFLUCAN) 150 MG tablet     Musculoskeletal and Integument   Osteoporosis (Chronic)   Relevant Orders   VITAMIN D 25 Hydroxy (Vit-D Deficiency, Fractures)     Other   Pre-diabetes (Chronic)   Relevant Orders   Hemoglobin A1c   Hyperlipidemia (Chronic)   Relevant Orders   Lipid Panel w/reflex Direct LDL   Other Visit Diagnoses     Thyroid disorder screen       Relevant Orders   TSH   Physical exam, annual       Relevant Orders   TSH   Lipid Panel w/reflex Direct LDL   COMPLETE METABOLIC PANEL WITH GFR   Hemoglobin A1c   Medication management       Relevant Orders   DRUG MONITORING, PANEL 6 WITH CONFIRMATION, URINE       Chronic Pain Opioid pain management contract renewed.  Drug screening and urinalysis completed.   COPD Exacerbation Take Prednisone 20 mg for 7 days then continue 10 mg maintenance dose. Going to hold off on  antibiotics at the moment due to recent history of C. Diff. Currently managed by pulmonology. Follow up if symptoms worsen or persist.  Thrush Prescribed fluconazole.  Educated patient to  rinse mouth after using  inhaled corticosteroids. Follow up as needed.  History of Anal Cancer Annual follow up with GI and radiology scheduled.  No follow-ups on file.     Dorian Heckle, Student-PA

## 2021-12-25 MED ORDER — OXYCODONE-ACETAMINOPHEN 10-325 MG PO TABS: 1.0000 | ORAL_TABLET | Freq: Four times a day (QID) | ORAL | 0 refills | Status: DC | PRN

## 2021-12-25 MED ORDER — LOSARTAN POTASSIUM 100 MG PO TABS: 100.0000 mg | ORAL_TABLET | Freq: Every day | ORAL | 1 refills | Status: DC

## 2021-12-25 MED ORDER — ATORVASTATIN CALCIUM 20 MG PO TABS: 20.0000 mg | ORAL_TABLET | Freq: Every day | ORAL | 3 refills | Status: DC

## 2021-12-26 LAB — DRUG MONITORING, PANEL 6 WITH CONFIRMATION, URINE
6 Acetylmorphine: NEGATIVE ng/mL (ref <10)
Alcohol Metabolites: NEGATIVE ng/mL (ref <500)
Amphetamines: NEGATIVE ng/mL (ref <500)
Barbiturates: NEGATIVE ng/mL (ref <300)
Benzodiazepines: NEGATIVE ng/mL (ref <100)
Cocaine Metabolite: NEGATIVE ng/mL (ref <150)
Codeine: NEGATIVE ng/mL (ref <50)
Creatinine: 34.9 mg/dL (ref >=20.0)
Hydrocodone: NEGATIVE ng/mL (ref <50)
Hydromorphone: NEGATIVE ng/mL (ref <50)
Marijuana Metabolite: NEGATIVE ng/mL (ref <20)
Methadone Metabolite: NEGATIVE ng/mL (ref <100)
Morphine: NEGATIVE ng/mL (ref <50)
Norhydrocodone: NEGATIVE ng/mL (ref <50)
Noroxycodone: 1696 ng/mL — ABNORMAL HIGH (ref <50)
Opiates: NEGATIVE ng/mL (ref <100)
Oxidant: NEGATIVE ug/mL (ref <200)
Oxycodone: 1167 ng/mL — ABNORMAL HIGH (ref <50)
Oxycodone: POSITIVE ng/mL — AB (ref <100)
Oxymorphone: 727 ng/mL — ABNORMAL HIGH (ref <50)
Phencyclidine: NEGATIVE ng/mL (ref <25)
pH: 7.4 (ref 4.5–9.0)

## 2021-12-26 LAB — DM TEMPLATE

## 2021-12-27 ENCOUNTER — Telehealth: Payer: Self-pay | Admitting: Neurology

## 2021-12-27 NOTE — Telephone Encounter (Signed)
Called patient and LVM, she was asking about Trelegy samples at her visit but we do not have any at this time. I do have a coupon card for Trelegy that states can be used with Medicare. LVM letting patient know I left this at the front for her to pick up if she would like. She was instructed to call with any questions.

## 2021-12-28 NOTE — Progress Notes (Signed)
Confirmed taking medication and no other restricted substances.

## 2021-12-31 DIAGNOSIS — Z800 Family history of malignant neoplasm of digestive organs: Secondary | ICD-10-CM | POA: Diagnosis not present

## 2021-12-31 DIAGNOSIS — I10 Essential (primary) hypertension: Secondary | ICD-10-CM | POA: Diagnosis not present

## 2022-01-02 ENCOUNTER — Other Ambulatory Visit: Payer: Self-pay | Admitting: Physician Assistant

## 2022-01-02 DIAGNOSIS — S22000A Wedge compression fracture of unspecified thoracic vertebra, initial encounter for closed fracture: Secondary | ICD-10-CM

## 2022-01-03 DIAGNOSIS — Z95828 Presence of other vascular implants and grafts: Secondary | ICD-10-CM | POA: Diagnosis not present

## 2022-01-03 DIAGNOSIS — C210 Malignant neoplasm of anus, unspecified: Secondary | ICD-10-CM | POA: Diagnosis not present

## 2022-01-05 DIAGNOSIS — J449 Chronic obstructive pulmonary disease, unspecified: Secondary | ICD-10-CM | POA: Diagnosis not present

## 2022-01-10 DIAGNOSIS — R7303 Prediabetes: Secondary | ICD-10-CM | POA: Diagnosis not present

## 2022-01-10 DIAGNOSIS — E782 Mixed hyperlipidemia: Secondary | ICD-10-CM | POA: Diagnosis not present

## 2022-01-10 DIAGNOSIS — I10 Essential (primary) hypertension: Secondary | ICD-10-CM | POA: Diagnosis not present

## 2022-01-10 DIAGNOSIS — M8080XD Other osteoporosis with current pathological fracture, unspecified site, subsequent encounter for fracture with routine healing: Secondary | ICD-10-CM | POA: Diagnosis not present

## 2022-01-11 ENCOUNTER — Other Ambulatory Visit: Payer: Self-pay | Admitting: Neurology

## 2022-01-11 DIAGNOSIS — N289 Disorder of kidney and ureter, unspecified: Secondary | ICD-10-CM

## 2022-01-11 LAB — COMPLETE METABOLIC PANEL WITH GFR
AG Ratio: 1.7 (calc) (ref 1.0–2.5)
ALT: 13 U/L (ref 6–29)
AST: 9 U/L — ABNORMAL LOW (ref 10–35)
Albumin: 4.3 g/dL (ref 3.6–5.1)
Alkaline phosphatase (APISO): 81 U/L (ref 37–153)
BUN/Creatinine Ratio: 15 (calc) (ref 6–22)
BUN: 18 mg/dL (ref 7–25)
CO2: 29 mmol/L (ref 20–32)
Calcium: 9.5 mg/dL (ref 8.6–10.4)
Chloride: 103 mmol/L (ref 98–110)
Creat: 1.17 mg/dL — ABNORMAL HIGH (ref 0.50–1.05)
Globulin: 2.6 g/dL (calc) (ref 1.9–3.7)
Glucose, Bld: 105 mg/dL — ABNORMAL HIGH (ref 65–99)
Potassium: 4.6 mmol/L (ref 3.5–5.3)
Sodium: 147 mmol/L — ABNORMAL HIGH (ref 135–146)
Total Bilirubin: 0.7 mg/dL (ref 0.2–1.2)
Total Protein: 6.9 g/dL (ref 6.1–8.1)
eGFR: 52 mL/min/{1.73_m2} — ABNORMAL LOW (ref >=60)

## 2022-01-11 LAB — TSH: TSH: 2.65 mIU/L (ref 0.40–4.50)

## 2022-01-11 LAB — LIPID PANEL W/REFLEX DIRECT LDL
Cholesterol: 237 mg/dL — ABNORMAL HIGH (ref <200)
HDL: 77 mg/dL (ref >=50)
LDL Cholesterol (Calc): 127 mg/dL (calc) — ABNORMAL HIGH
Non-HDL Cholesterol (Calc): 160 mg/dL (calc) — ABNORMAL HIGH (ref <130)
Total CHOL/HDL Ratio: 3.1 (calc) (ref <5.0)
Triglycerides: 191 mg/dL — ABNORMAL HIGH (ref <150)

## 2022-01-11 LAB — VITAMIN D 25 HYDROXY (VIT D DEFICIENCY, FRACTURES): Vit D, 25-Hydroxy: 29 ng/mL — ABNORMAL LOW (ref 30–100)

## 2022-01-11 LAB — HEMOGLOBIN A1C
Hgb A1c MFr Bld: 6.1 % of total Hgb — ABNORMAL HIGH (ref <5.7)
Mean Plasma Glucose: 128 mg/dL
eAG (mmol/L): 7.1 mmol/L

## 2022-01-11 NOTE — Progress Notes (Signed)
Sue Smith,   Thyroid in normal range.  Vitamin D is low. How much are you taking?  A1C is up from 1 year ago. Likely somewhat due to the prednisone intake. Will need to watch. Recheck in 3-6 months.  Make sure avoiding carbs and high sugars foods.  Kidney function has dropped some. Recheck in 3 weeks.  Cholesterol is way up from 1 year ago.  I would like to increase lipitor. Are you taking the '20mg'$  daily?

## 2022-01-14 DIAGNOSIS — Z419 Encounter for procedure for purposes other than remedying health state, unspecified: Secondary | ICD-10-CM | POA: Diagnosis not present

## 2022-01-15 MED ORDER — ATORVASTATIN CALCIUM 40 MG PO TABS: 40.0000 mg | ORAL_TABLET | Freq: Every day | ORAL | 3 refills | Status: DC

## 2022-01-15 NOTE — Addendum Note (Signed)
Addended by: Donella Stade on: 01/15/2022 07:09 AM   Modules accepted: Orders

## 2022-01-16 ENCOUNTER — Encounter: Payer: Self-pay | Admitting: Physician Assistant

## 2022-01-16 DIAGNOSIS — Z9981 Dependence on supplemental oxygen: Secondary | ICD-10-CM | POA: Diagnosis not present

## 2022-01-16 DIAGNOSIS — R059 Cough, unspecified: Secondary | ICD-10-CM | POA: Diagnosis not present

## 2022-01-16 DIAGNOSIS — F17210 Nicotine dependence, cigarettes, uncomplicated: Secondary | ICD-10-CM | POA: Diagnosis not present

## 2022-01-16 DIAGNOSIS — J441 Chronic obstructive pulmonary disease with (acute) exacerbation: Secondary | ICD-10-CM | POA: Diagnosis not present

## 2022-01-16 DIAGNOSIS — J3089 Other allergic rhinitis: Secondary | ICD-10-CM | POA: Diagnosis not present

## 2022-01-16 DIAGNOSIS — J9611 Chronic respiratory failure with hypoxia: Secondary | ICD-10-CM | POA: Diagnosis not present

## 2022-01-16 MED ORDER — PREDNISONE 20 MG PO TABS: ORAL_TABLET | ORAL | 0 refills | Status: DC

## 2022-01-16 NOTE — Telephone Encounter (Signed)
COPD- on 3L O2. Worsening SOB. Sent prednisone taper. Follow up if symptoms not improving or worsening.

## 2022-01-23 ENCOUNTER — Ambulatory Visit: Payer: BC Managed Care – PPO

## 2022-01-25 ENCOUNTER — Telehealth: Payer: Self-pay

## 2022-01-25 ENCOUNTER — Encounter: Payer: Self-pay | Admitting: Physician Assistant

## 2022-01-25 NOTE — Telephone Encounter (Addendum)
Initiated Prior authorization QIW:[id number] 10-'325MG'$  tablets Via: Covermymeds Case/Key:[id number]  Status: approved  as of 01/25/22 Reason:This drug has been approved. Approved quantity: 120 tablets per 30 day(s). You may fill up to a 34 day supply at a retail pharmacy. You may fill up to a 90 day supply for maintenance drugs, please refer to the formulary for details. Please call the pharmacy to process your prescription claim. Notified Pt via: Mychart

## 2022-02-05 DIAGNOSIS — R300 Dysuria: Secondary | ICD-10-CM | POA: Diagnosis not present

## 2022-02-05 DIAGNOSIS — R197 Diarrhea, unspecified: Secondary | ICD-10-CM | POA: Diagnosis not present

## 2022-02-06 DIAGNOSIS — R197 Diarrhea, unspecified: Secondary | ICD-10-CM | POA: Diagnosis not present

## 2022-02-12 DIAGNOSIS — J441 Chronic obstructive pulmonary disease with (acute) exacerbation: Secondary | ICD-10-CM | POA: Diagnosis not present

## 2022-02-12 DIAGNOSIS — Z9981 Dependence on supplemental oxygen: Secondary | ICD-10-CM | POA: Diagnosis not present

## 2022-02-12 DIAGNOSIS — J9611 Chronic respiratory failure with hypoxia: Secondary | ICD-10-CM | POA: Diagnosis not present

## 2022-02-12 DIAGNOSIS — J3089 Other allergic rhinitis: Secondary | ICD-10-CM | POA: Diagnosis not present

## 2022-02-12 DIAGNOSIS — F17210 Nicotine dependence, cigarettes, uncomplicated: Secondary | ICD-10-CM | POA: Diagnosis not present

## 2022-02-13 ENCOUNTER — Ambulatory Visit: Payer: BC Managed Care – PPO

## 2022-02-14 DIAGNOSIS — Z419 Encounter for procedure for purposes other than remedying health state, unspecified: Secondary | ICD-10-CM | POA: Diagnosis not present

## 2022-02-15 IMAGING — DX DG CHEST 2V
2 series · 2 of 2 positions shown · non-contrast
Comparison: 01/27/2018

CLINICAL DATA: Hypoxia, mid back pain

EXAM:
CHEST - 2 VIEW

[chest pa]
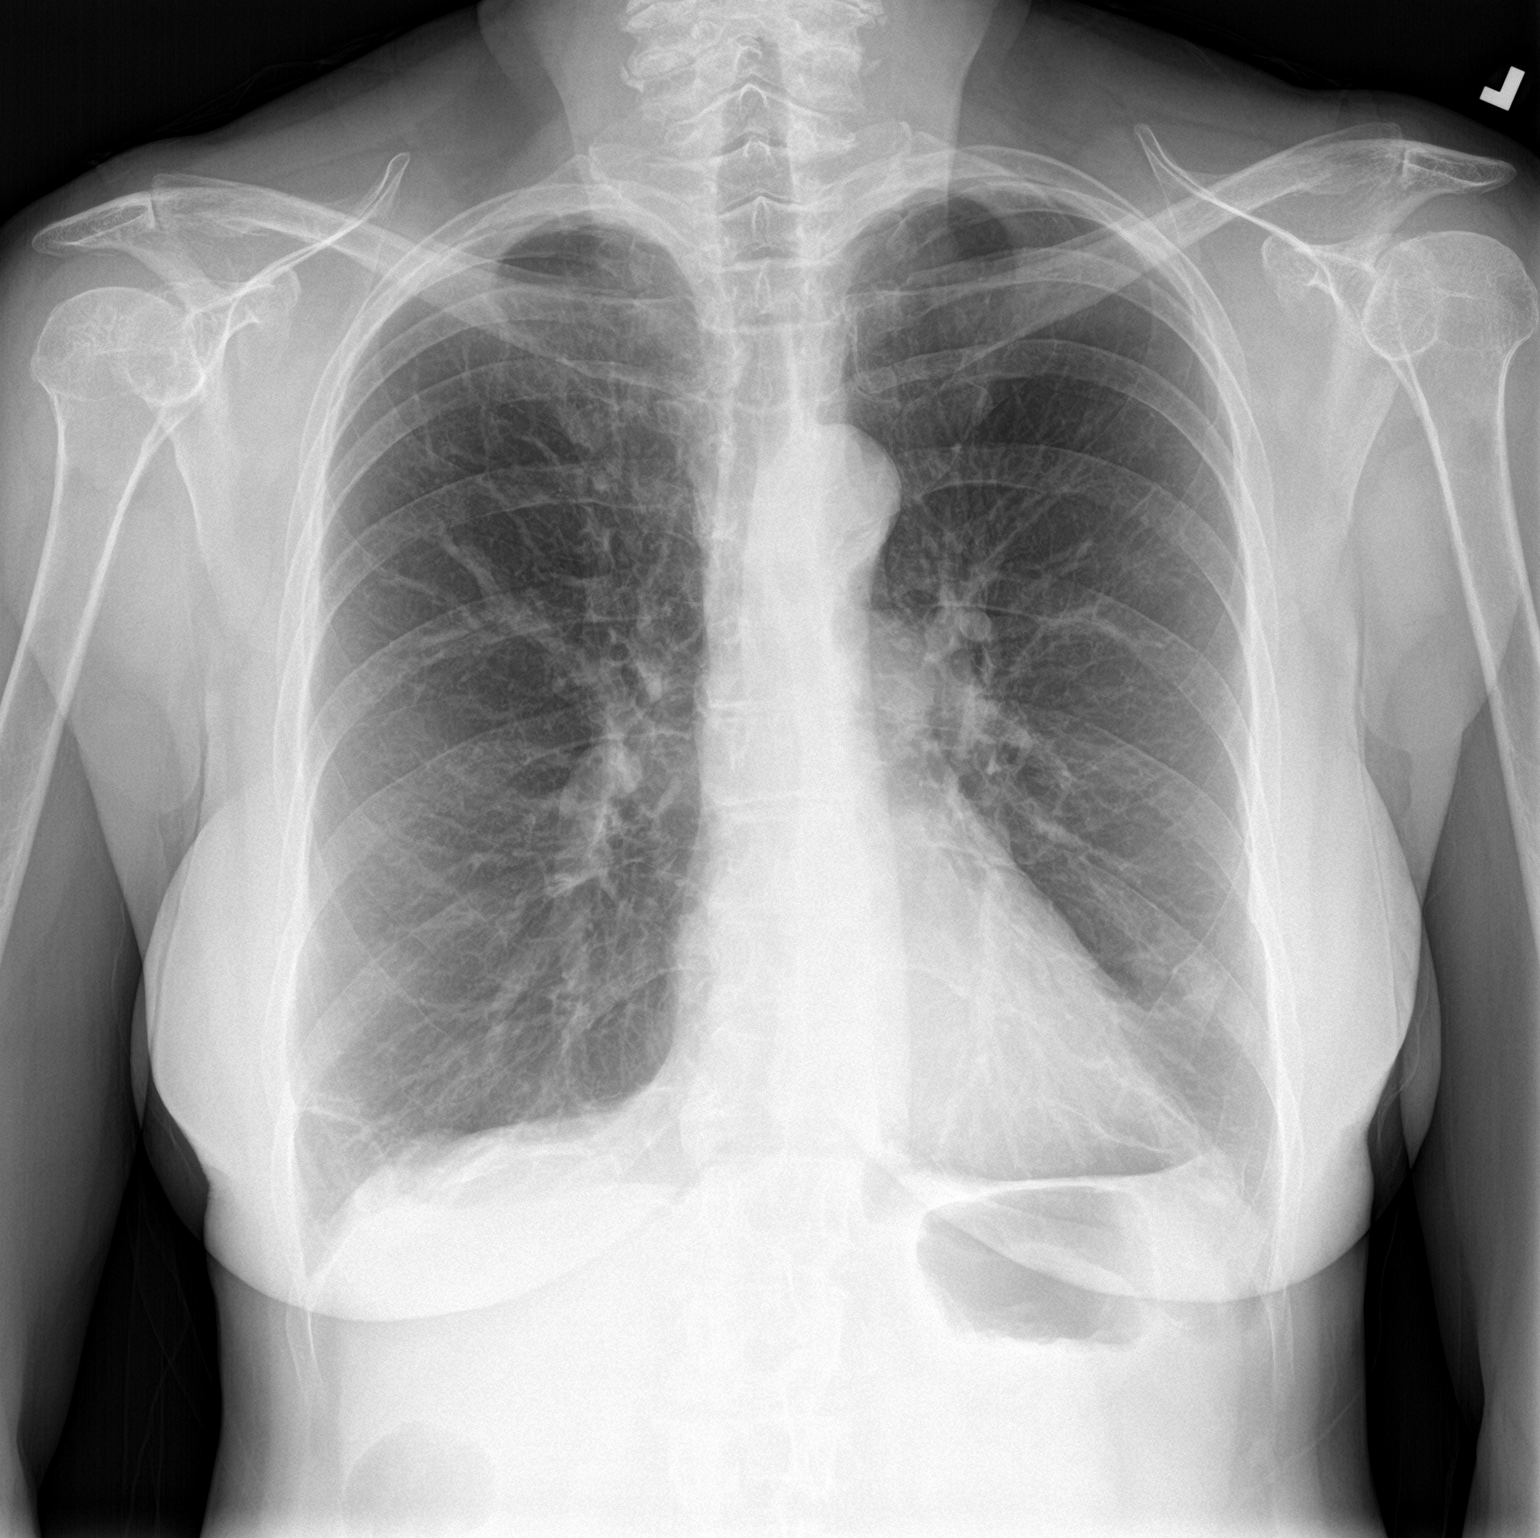

[chest lat]
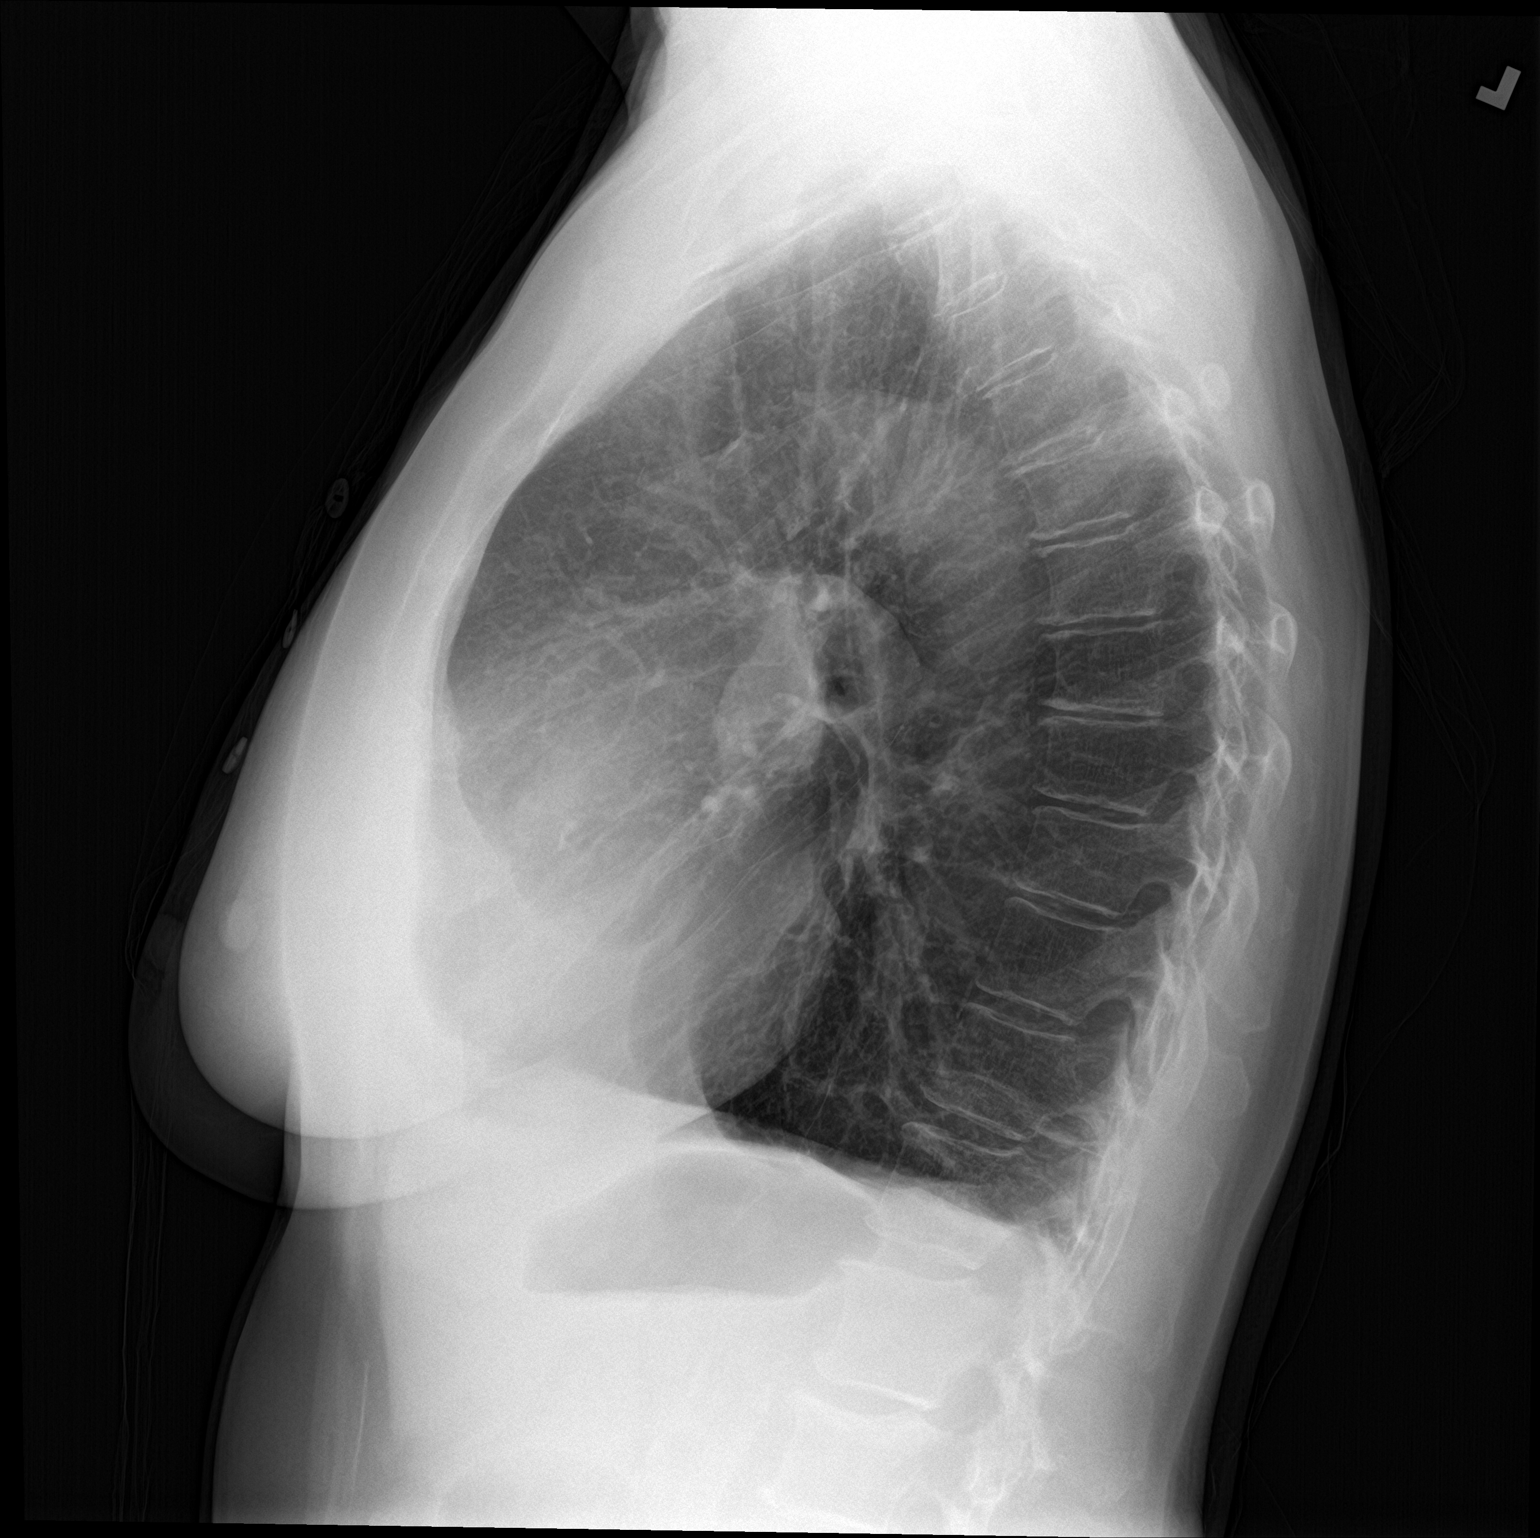

[2 of 2 positions shown; findings below may reference images not displayed]

FINDINGS: The lungs are mildly hyperinflated in keeping with changes of
underlying COPD. No superimposed focal pulmonary infiltrate. No
pneumothorax or pleural effusion. Cardiac size within normal limits.
The pulmonary vascularity is normal. Osseous structures are
age-appropriate. No acute bone abnormality.
IMPRESSION: COPD. No acute cardiopulmonary findings.

## 2022-02-21 DIAGNOSIS — A0472 Enterocolitis due to Clostridium difficile, not specified as recurrent: Secondary | ICD-10-CM | POA: Diagnosis not present

## 2022-02-21 DIAGNOSIS — N390 Urinary tract infection, site not specified: Secondary | ICD-10-CM | POA: Diagnosis not present

## 2022-03-06 ENCOUNTER — Other Ambulatory Visit: Payer: Self-pay | Admitting: Physician Assistant

## 2022-03-06 DIAGNOSIS — J441 Chronic obstructive pulmonary disease with (acute) exacerbation: Secondary | ICD-10-CM

## 2022-03-06 DIAGNOSIS — R062 Wheezing: Secondary | ICD-10-CM

## 2022-03-11 DIAGNOSIS — A0471 Enterocolitis due to Clostridium difficile, recurrent: Secondary | ICD-10-CM | POA: Diagnosis not present

## 2022-03-15 DIAGNOSIS — Z419 Encounter for procedure for purposes other than remedying health state, unspecified: Secondary | ICD-10-CM | POA: Diagnosis not present

## 2022-03-19 ENCOUNTER — Encounter: Payer: Self-pay | Admitting: Physician Assistant

## 2022-03-19 ENCOUNTER — Ambulatory Visit (INDEPENDENT_AMBULATORY_CARE_PROVIDER_SITE_OTHER): Payer: Medicaid Other | Admitting: Physician Assistant

## 2022-03-19 VITALS — BP 138/82 | HR 98 | Ht 67.0 in | Wt 120.0 lb

## 2022-03-19 DIAGNOSIS — M5136 Other intervertebral disc degeneration, lumbar region: Secondary | ICD-10-CM | POA: Diagnosis not present

## 2022-03-19 DIAGNOSIS — I10 Essential (primary) hypertension: Secondary | ICD-10-CM

## 2022-03-19 DIAGNOSIS — F418 Other specified anxiety disorders: Secondary | ICD-10-CM | POA: Diagnosis not present

## 2022-03-19 DIAGNOSIS — J449 Chronic obstructive pulmonary disease, unspecified: Secondary | ICD-10-CM | POA: Diagnosis not present

## 2022-03-19 DIAGNOSIS — G894 Chronic pain syndrome: Secondary | ICD-10-CM | POA: Diagnosis not present

## 2022-03-19 MED ORDER — OXYCODONE-ACETAMINOPHEN 10-325 MG PO TABS: 1.0000 | ORAL_TABLET | Freq: Four times a day (QID) | ORAL | 0 refills | Status: DC | PRN

## 2022-03-19 MED ORDER — BUSPIRONE HCL 7.5 MG PO TABS: 7.5000 mg | ORAL_TABLET | Freq: Three times a day (TID) | ORAL | 2 refills | Status: DC

## 2022-03-19 MED ORDER — BREZTRI AEROSPHERE 160-9-4.8 MCG/ACT IN AERO: 2.0000 | INHALATION_SPRAY | Freq: Two times a day (BID) | RESPIRATORY_TRACT | 1 refills | Status: DC

## 2022-03-19 NOTE — Progress Notes (Signed)
Established Patient Office Visit  Subjective   Patient ID: Sue Smith, female    DOB: 1955/08/30  Age: 67 y.o. MRN: [id number]  Chief Complaint  Patient presents with   Follow-up    HPI Patient is a 67 year old female with end-stage COPD, hypertension, atrial fibrillation with RVR, GERD, chronic pain due to degenerative disc disease who presents to the clinic for medication follow-up and refills.  Patient has seen Dr. Freda Smith as her pulmonologist in [place].  He has recently told her to follow-up with palliative care to see if there is anything they can do for her shortness of breath and lung function.  She was taken off Trelegy due to cost.  She was started on Symbicort and Spiriva.  She does not feel like that has been as helpful.  She would like a second opinion on what to do for her lung function.  She continues to use DuoNeb every 4-6 hours.  She is concerned because of her multiple antibiotics that she has had to use and her history of C. difficile.  She is currently been more short of breath with stable productive cough.  She denies any fever, chills, body aches.  She does sleep with nasal cannula oxygen and uses 3 to 4 L of oxygen continuously through the day.  No concerns with her oxycodone and pain control.  She does need a refill.  She has become more more anxious.  She is worried about leaving the house by herself.  She is worried about staying at home by herself.  She feels like if she cannot breathe she does not have anyone to check in on her.  She could move in with her children but she does not want to get to that point.  She feels like the anxiety makes the shortness of breath worse and wonders if she was less anxious maybe she could breathe better.  .. Active Ambulatory Problems    Diagnosis Date Noted   Essential hypertension 11/04/2011   Surgical menopause 11/04/2011   Vasomotor flushing 11/18/2011   Pre-diabetes 11/20/2011   Personal history of colonic  polyps 03/26/2012   Osteoporosis 11/25/2012   Hyperlipidemia 11/25/2012   DDD (degenerative disc disease), lumbar 03/23/2013   Insomnia 11/25/2013   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   Simple chronic bronchitis ([place]) 08/27/2016   Actinic keratosis 11/29/2016   Chronic pain syndrome 11/29/2016   Gastroesophageal reflux disease 11/29/2016   Current smoker 02/26/2017   Grief 06/30/2017   No energy 07/29/2018   Irregular heart rhythm 05/06/2019   PAC (premature atrial contraction) 05/06/2019   PVC (premature ventricular contraction) 05/06/2019   Atrial fibrillation with RVR ([place]) 11/16/2019   COVID-19 virus infection 11/16/2019   Anal lesion 07/10/2020   Anal cancer ([place]) 10/18/2020   Drug-induced constipation 03/26/2021   Common bile duct dilation 03/26/2021   History of rectal or anal cancer 03/26/2021   Colitis 03/26/2021   Hypocalcemia 04/30/2021   Clostridium difficile colitis 04/30/2021   Leukocytosis 05/01/2021   Unintended weight loss 05/01/2021   Compression fracture of body of thoracic vertebra ([place]) 11/20/2021   Thrush 12/24/2021   End stage COPD ([place]) 03/19/2022   Anxiety about health 03/19/2022   Resolved Ambulatory Problems    Diagnosis Date Noted   Post-viral cough syndrome 11/16/2019   Past Medical History:  Diagnosis Date   Cervical dysplasia    COPD (chronic obstructive pulmonary disease) ([place])    Hypertension      ROS  See HPI.  Objective:     BP 138/82   Pulse 98   Ht '5\' 7"'$  (1.702 m)   Wt 120 lb (54.4 kg)   SpO2 96%   BMI 18.79 kg/m  BP Readings from Last 3 Encounters:  03/19/22 138/82  12/24/21 138/84  12/18/21 128/85   Wt Readings from Last 3 Encounters:  03/19/22 120 lb (54.4 kg)  12/24/21 117 lb (53.1 kg)  12/18/21 119 lb (54 kg)     Physical Exam Constitutional:      Appearance: She is ill-appearing.  HENT:     Head: Normocephalic.  Musculoskeletal:     Right lower leg: No  edema.     Left lower leg: No edema.  Neurological:     General: No focal deficit present.     Mental Status: She is alert and oriented to person, place, and time.  Psychiatric:        Mood and Affect: Mood normal.      The 10-year ASCVD risk score (Arnett DK, et al., 2019) is: 13.5%    Assessment & Plan:  Marland KitchenMarland KitchenRachel was seen today for follow-up.  Diagnoses and all orders for this visit:  Chronic pain syndrome -     oxyCODONE-acetaminophen (PERCOCET) 10-325 MG tablet; Take 1 tablet by mouth every 6 (six) hours as needed for pain. -     oxyCODONE-acetaminophen (PERCOCET) 10-325 MG tablet; Take 1 tablet by mouth every 6 (six) hours as needed for pain. -     oxyCODONE-acetaminophen (PERCOCET) 10-325 MG tablet; Take 1 tablet by mouth every 6 (six) hours as needed for pain.  DDD (degenerative disc disease), lumbar -     oxyCODONE-acetaminophen (PERCOCET) 10-325 MG tablet; Take 1 tablet by mouth every 6 (six) hours as needed for pain. -     oxyCODONE-acetaminophen (PERCOCET) 10-325 MG tablet; Take 1 tablet by mouth every 6 (six) hours as needed for pain. -     oxyCODONE-acetaminophen (PERCOCET) 10-325 MG tablet; Take 1 tablet by mouth every 6 (six) hours as needed for pain.  End stage COPD ([place]) -     Budeson-Glycopyrrol-Formoterol (BREZTRI AEROSPHERE) 160-9-4.8 MCG/ACT AERO; Inhale 2 puffs into the lungs 2 (two) times daily. -     Ambulatory referral to Pulmonology  Essential hypertension  Anxiety about health -     busPIRone (BUSPAR) 7.5 MG tablet; Take 1 tablet (7.5 mg total) by mouth 3 (three) times daily.   BP and HR improved on 2nd recheck after patient rested Continue on same medications  COPD-referral made for [facility]nd opinion with [facility] Discussed sleep study  Stop symbicort and spiriva start Breztri Continue with duoneb every 4-6 hours Continue on 3L of O2 continously  Discussed anxiety Started buspar up to three times a day  Pain contract UTD .Marland KitchenPDMP reviewed during  this encounter. No concerns Refilled oxycodone Follow up in 3 months due to [place] controlled substance law follow up     Return in about 3 months (around 06/19/2022).    Sue Planas, PA-C

## 2022-03-19 NOTE — Patient Instructions (Signed)
Stop symbicort and spiriva. Start [employer].  Consider sleep study Start buspar for anxiety twice a day

## 2022-03-26 DIAGNOSIS — J449 Chronic obstructive pulmonary disease, unspecified: Secondary | ICD-10-CM | POA: Diagnosis not present

## 2022-03-26 DIAGNOSIS — J3089 Other allergic rhinitis: Secondary | ICD-10-CM | POA: Diagnosis not present

## 2022-03-26 DIAGNOSIS — J9611 Chronic respiratory failure with hypoxia: Secondary | ICD-10-CM | POA: Diagnosis not present

## 2022-03-26 DIAGNOSIS — G4710 Hypersomnia, unspecified: Secondary | ICD-10-CM | POA: Diagnosis not present

## 2022-03-26 DIAGNOSIS — F17210 Nicotine dependence, cigarettes, uncomplicated: Secondary | ICD-10-CM | POA: Diagnosis not present

## 2022-03-26 DIAGNOSIS — Z9981 Dependence on supplemental oxygen: Secondary | ICD-10-CM | POA: Diagnosis not present

## 2022-03-28 ENCOUNTER — Telehealth: Payer: Self-pay

## 2022-03-28 NOTE — Telephone Encounter (Addendum)
Initiated Prior authorization ZOX:[id number] Aerosphere 160-9-4.8MCG/ACT aerosol  Via: Covermymeds Case/Key:[id number] Status: approved as of 03/28/22 Reason:Approved. This drug has been approved. Approved quantity: 10.7 units per 30 day(s). The drug has been approved from 03/14/2022 to 03/28/2023. Please call the pharmacy to process your prescription claim. Generic or biosimilar substitution may be required when available and preferred on the formulary. Authorization Expiration Date: 03/28/2023 Notified Pt via: called pt unable to leave vm due it inbox being full

## 2022-04-01 DIAGNOSIS — R918 Other nonspecific abnormal finding of lung field: Secondary | ICD-10-CM | POA: Diagnosis not present

## 2022-04-01 DIAGNOSIS — J9621 Acute and chronic respiratory failure with hypoxia: Secondary | ICD-10-CM | POA: Diagnosis not present

## 2022-04-01 DIAGNOSIS — J90 Pleural effusion, not elsewhere classified: Secondary | ICD-10-CM | POA: Diagnosis not present

## 2022-04-01 DIAGNOSIS — R062 Wheezing: Secondary | ICD-10-CM | POA: Diagnosis not present

## 2022-04-01 DIAGNOSIS — E782 Mixed hyperlipidemia: Secondary | ICD-10-CM | POA: Diagnosis not present

## 2022-04-01 DIAGNOSIS — I700 Atherosclerosis of aorta: Secondary | ICD-10-CM | POA: Diagnosis not present

## 2022-04-01 DIAGNOSIS — I480 Paroxysmal atrial fibrillation: Secondary | ICD-10-CM | POA: Diagnosis not present

## 2022-04-01 DIAGNOSIS — J168 Pneumonia due to other specified infectious organisms: Secondary | ICD-10-CM | POA: Diagnosis not present

## 2022-04-01 DIAGNOSIS — I10 Essential (primary) hypertension: Secondary | ICD-10-CM | POA: Diagnosis not present

## 2022-04-01 DIAGNOSIS — R069 Unspecified abnormalities of breathing: Secondary | ICD-10-CM | POA: Diagnosis not present

## 2022-04-01 DIAGNOSIS — K219 Gastro-esophageal reflux disease without esophagitis: Secondary | ICD-10-CM | POA: Diagnosis not present

## 2022-04-01 DIAGNOSIS — J441 Chronic obstructive pulmonary disease with (acute) exacerbation: Secondary | ICD-10-CM | POA: Diagnosis not present

## 2022-04-01 DIAGNOSIS — J9811 Atelectasis: Secondary | ICD-10-CM | POA: Diagnosis not present

## 2022-04-01 DIAGNOSIS — J189 Pneumonia, unspecified organism: Secondary | ICD-10-CM | POA: Diagnosis not present

## 2022-04-01 DIAGNOSIS — E860 Dehydration: Secondary | ICD-10-CM | POA: Diagnosis not present

## 2022-04-01 DIAGNOSIS — G894 Chronic pain syndrome: Secondary | ICD-10-CM | POA: Diagnosis not present

## 2022-04-01 DIAGNOSIS — Z9981 Dependence on supplemental oxygen: Secondary | ICD-10-CM | POA: Diagnosis not present

## 2022-04-01 DIAGNOSIS — R0602 Shortness of breath: Secondary | ICD-10-CM | POA: Diagnosis not present

## 2022-04-01 DIAGNOSIS — J440 Chronic obstructive pulmonary disease with acute lower respiratory infection: Secondary | ICD-10-CM | POA: Diagnosis not present

## 2022-04-01 DIAGNOSIS — Z743 Need for continuous supervision: Secondary | ICD-10-CM | POA: Diagnosis not present

## 2022-04-01 DIAGNOSIS — I2510 Atherosclerotic heart disease of native coronary artery without angina pectoris: Secondary | ICD-10-CM | POA: Diagnosis not present

## 2022-04-01 DIAGNOSIS — B370 Candidal stomatitis: Secondary | ICD-10-CM | POA: Diagnosis not present

## 2022-04-01 DIAGNOSIS — J439 Emphysema, unspecified: Secondary | ICD-10-CM | POA: Diagnosis not present

## 2022-04-03 ENCOUNTER — Encounter: Payer: Self-pay | Admitting: Physician Assistant

## 2022-04-04 ENCOUNTER — Telehealth: Payer: Self-pay | Admitting: *Deleted

## 2022-04-04 ENCOUNTER — Encounter: Payer: Self-pay | Admitting: *Deleted

## 2022-04-04 NOTE — Transitions of Care (Post Inpatient/ED Visit) (Signed)
04/04/2022  Name: Sue Smith MRN: [id number] DOB: [birth year]  Today's TOC FU Call Status: Today's TOC FU Call Status:: Successful TOC FU Call Competed TOC FU Call Complete Date: 04/04/22  Transition Care Management Follow-up Telephone Call Date of Discharge: 04/03/22 Discharge Facility: Other ([facility]) Name of Other ([facility]) Discharge Facility: [facility] Type of Discharge: Inpatient Admission Primary Inpatient Discharge Diagnosis:: Acute on chronic Respiratory Failure; pneumonia/ shortness of breath How have you been since you were released from the hospital?: Better ("so far so good-- not having any problems at this point; taking all of the new medicines like they told me to") Any questions or concerns?: No  Items Reviewed: Did you receive and understand the discharge instructions provided?: Yes (thoroughly reviewed with patient who verbalizes excellent understanding of same) Medications obtained and verified?: Yes (Medications Reviewed) (Full medication review completed; no concerns or discrepancies identified; confirmed patient obtained/ is taking all newly Rx'd medications as instructed; self-manages medications and denies questions/ concerns around medications today)  Verified patient obtained/ is taking the following new medications post- hospital; discharge on 04/03/22: -- vancomycin x 10 days -- prednisone taper -- zithromax QD x 3 da7ys -- cefdinir BID x 5 days  Any new allergies since your discharge?: No Dietary orders reviewed?: Yes Type of Diet Ordered:: "Heart Healthy/ low salt" Do you have support at home?: Yes People in Home: alone Name of Support/Comfort Primary Source: reports independent in self-care activities; resides alone; local adult daughters live close by and provide support with care needs as indicated/ needed  Home Care and Equipment/Supplies: [place] Ordered?: Yes Name of [place]:: Long Has Agency set up a time to come to your home?: Yes [place] Visit Date: 04/05/22 Any new equipment or medical supplies ordered?: No  Functional Questionnaire: Do you need assistance with bathing/showering or dressing?: No Do you need assistance with meal preparation?: No Do you need assistance with eating?: No Do you have difficulty maintaining continence: No Do you need assistance with getting out of bed/getting out of a chair/moving?: No Do you have difficulty managing or taking your medications?: No  Follow up appointments reviewed: PCP Follow-up appointment confirmed?: Yes Date of PCP follow-up appointment?: 04/10/22 Follow-up Provider: PCP [facility] Follow-up appointment confirmed?: Yes Date of Specialist follow-up appointment?: 04/24/22 Follow-Up Specialty Provider:: pulmoary provider at [facility] Do you need transportation to your follow-up appointment?: No Do you understand care options if your condition(s) worsen?: Yes-patient verbalized understanding  SDOH Interventions Today    Flowsheet Row Most Recent Value  SDOH Interventions   Food Insecurity Interventions Intervention Not Indicated  Transportation Interventions Intervention Not Indicated  [local adult daughters provide transportation,  patient states she occasionally drives self, but not often]      TOC Interventions Today    Flowsheet Row Most Recent Value  TOC Interventions   TOC Interventions Discussed/Reviewed TOC Interventions Discussed  [Patient declines need for ongoing/ further care coordination outreach,  no care coordination needs identified at time of TOC call today,  provided my direct contact information should questions/ concerns/ needs arise post-TOC call]      Interventions Today    Flowsheet Row Most Recent Value  Chronic Disease   Chronic disease during today's visit Chronic Obstructive Pulmonary Disease (COPD)  General Interventions   General Interventions  Discussed/Reviewed General Interventions Discussed, Doctor Visits  Doctor Visits Discussed/Reviewed Doctor Visits Discussed, PCP, Specialist  PCP/Specialist Visits Compliance with follow-up visit  Education Interventions  Education Provided Provided Education  Provided Verbal Education On Other, When to see the doctor, Medication  [role/ purpose of palliative care-encouraged patient's active participation/ engagement]  Nutrition Interventions   Nutrition Discussed/Reviewed Nutrition Discussed  Pharmacy Interventions   Pharmacy Dicussed/Reviewed Pharmacy Topics Discussed  [Full medication review with updating medication list in EHR per patient report]      Oneta Rack, RN, BSN, CCRN Alumnus RN CM Care Coordination/ Transition of [place] [facility] [phone]: direct office

## 2022-04-05 ENCOUNTER — Encounter: Payer: Self-pay | Admitting: Physician Assistant

## 2022-04-05 DIAGNOSIS — J441 Chronic obstructive pulmonary disease with (acute) exacerbation: Secondary | ICD-10-CM | POA: Diagnosis not present

## 2022-04-05 DIAGNOSIS — G894 Chronic pain syndrome: Secondary | ICD-10-CM | POA: Diagnosis not present

## 2022-04-05 DIAGNOSIS — Z85048 Personal history of other malignant neoplasm of rectum, rectosigmoid junction, and anus: Secondary | ICD-10-CM | POA: Diagnosis not present

## 2022-04-05 DIAGNOSIS — J440 Chronic obstructive pulmonary disease with acute lower respiratory infection: Secondary | ICD-10-CM | POA: Diagnosis not present

## 2022-04-05 DIAGNOSIS — J189 Pneumonia, unspecified organism: Secondary | ICD-10-CM | POA: Diagnosis not present

## 2022-04-05 DIAGNOSIS — Z720 Tobacco use: Secondary | ICD-10-CM | POA: Diagnosis not present

## 2022-04-05 DIAGNOSIS — I480 Paroxysmal atrial fibrillation: Secondary | ICD-10-CM | POA: Diagnosis not present

## 2022-04-05 DIAGNOSIS — K219 Gastro-esophageal reflux disease without esophagitis: Secondary | ICD-10-CM | POA: Diagnosis not present

## 2022-04-05 DIAGNOSIS — Z8619 Personal history of other infectious and parasitic diseases: Secondary | ICD-10-CM | POA: Diagnosis not present

## 2022-04-05 DIAGNOSIS — S22000D Wedge compression fracture of unspecified thoracic vertebra, subsequent encounter for fracture with routine healing: Secondary | ICD-10-CM | POA: Diagnosis not present

## 2022-04-05 DIAGNOSIS — J9611 Chronic respiratory failure with hypoxia: Secondary | ICD-10-CM | POA: Diagnosis not present

## 2022-04-05 DIAGNOSIS — R911 Solitary pulmonary nodule: Secondary | ICD-10-CM | POA: Diagnosis not present

## 2022-04-05 DIAGNOSIS — K5909 Other constipation: Secondary | ICD-10-CM | POA: Diagnosis not present

## 2022-04-06 DIAGNOSIS — K219 Gastro-esophageal reflux disease without esophagitis: Secondary | ICD-10-CM | POA: Diagnosis not present

## 2022-04-06 DIAGNOSIS — J440 Chronic obstructive pulmonary disease with acute lower respiratory infection: Secondary | ICD-10-CM | POA: Diagnosis not present

## 2022-04-06 DIAGNOSIS — J189 Pneumonia, unspecified organism: Secondary | ICD-10-CM | POA: Diagnosis not present

## 2022-04-06 DIAGNOSIS — Z8619 Personal history of other infectious and parasitic diseases: Secondary | ICD-10-CM | POA: Diagnosis not present

## 2022-04-06 DIAGNOSIS — J9611 Chronic respiratory failure with hypoxia: Secondary | ICD-10-CM | POA: Diagnosis not present

## 2022-04-06 DIAGNOSIS — Z85048 Personal history of other malignant neoplasm of rectum, rectosigmoid junction, and anus: Secondary | ICD-10-CM | POA: Diagnosis not present

## 2022-04-06 DIAGNOSIS — S22000D Wedge compression fracture of unspecified thoracic vertebra, subsequent encounter for fracture with routine healing: Secondary | ICD-10-CM | POA: Diagnosis not present

## 2022-04-06 DIAGNOSIS — R911 Solitary pulmonary nodule: Secondary | ICD-10-CM | POA: Diagnosis not present

## 2022-04-06 DIAGNOSIS — I480 Paroxysmal atrial fibrillation: Secondary | ICD-10-CM | POA: Diagnosis not present

## 2022-04-06 DIAGNOSIS — G894 Chronic pain syndrome: Secondary | ICD-10-CM | POA: Diagnosis not present

## 2022-04-06 DIAGNOSIS — Z720 Tobacco use: Secondary | ICD-10-CM | POA: Diagnosis not present

## 2022-04-06 DIAGNOSIS — K5909 Other constipation: Secondary | ICD-10-CM | POA: Diagnosis not present

## 2022-04-06 DIAGNOSIS — J441 Chronic obstructive pulmonary disease with (acute) exacerbation: Secondary | ICD-10-CM | POA: Diagnosis not present

## 2022-04-07 DIAGNOSIS — G894 Chronic pain syndrome: Secondary | ICD-10-CM | POA: Diagnosis not present

## 2022-04-07 DIAGNOSIS — J440 Chronic obstructive pulmonary disease with acute lower respiratory infection: Secondary | ICD-10-CM | POA: Diagnosis not present

## 2022-04-07 DIAGNOSIS — K5909 Other constipation: Secondary | ICD-10-CM | POA: Diagnosis not present

## 2022-04-07 DIAGNOSIS — K219 Gastro-esophageal reflux disease without esophagitis: Secondary | ICD-10-CM | POA: Diagnosis not present

## 2022-04-07 DIAGNOSIS — I480 Paroxysmal atrial fibrillation: Secondary | ICD-10-CM | POA: Diagnosis not present

## 2022-04-07 DIAGNOSIS — J189 Pneumonia, unspecified organism: Secondary | ICD-10-CM | POA: Diagnosis not present

## 2022-04-07 DIAGNOSIS — J9611 Chronic respiratory failure with hypoxia: Secondary | ICD-10-CM | POA: Diagnosis not present

## 2022-04-07 DIAGNOSIS — S22000D Wedge compression fracture of unspecified thoracic vertebra, subsequent encounter for fracture with routine healing: Secondary | ICD-10-CM | POA: Diagnosis not present

## 2022-04-07 DIAGNOSIS — Z8619 Personal history of other infectious and parasitic diseases: Secondary | ICD-10-CM | POA: Diagnosis not present

## 2022-04-07 DIAGNOSIS — J441 Chronic obstructive pulmonary disease with (acute) exacerbation: Secondary | ICD-10-CM | POA: Diagnosis not present

## 2022-04-07 DIAGNOSIS — Z720 Tobacco use: Secondary | ICD-10-CM | POA: Diagnosis not present

## 2022-04-07 DIAGNOSIS — R911 Solitary pulmonary nodule: Secondary | ICD-10-CM | POA: Diagnosis not present

## 2022-04-07 DIAGNOSIS — Z85048 Personal history of other malignant neoplasm of rectum, rectosigmoid junction, and anus: Secondary | ICD-10-CM | POA: Diagnosis not present

## 2022-04-08 DIAGNOSIS — J440 Chronic obstructive pulmonary disease with acute lower respiratory infection: Secondary | ICD-10-CM | POA: Diagnosis not present

## 2022-04-08 DIAGNOSIS — K219 Gastro-esophageal reflux disease without esophagitis: Secondary | ICD-10-CM | POA: Diagnosis not present

## 2022-04-08 DIAGNOSIS — J189 Pneumonia, unspecified organism: Secondary | ICD-10-CM | POA: Diagnosis not present

## 2022-04-08 DIAGNOSIS — Z85048 Personal history of other malignant neoplasm of rectum, rectosigmoid junction, and anus: Secondary | ICD-10-CM | POA: Diagnosis not present

## 2022-04-08 DIAGNOSIS — K5909 Other constipation: Secondary | ICD-10-CM | POA: Diagnosis not present

## 2022-04-08 DIAGNOSIS — S22000D Wedge compression fracture of unspecified thoracic vertebra, subsequent encounter for fracture with routine healing: Secondary | ICD-10-CM | POA: Diagnosis not present

## 2022-04-08 DIAGNOSIS — J441 Chronic obstructive pulmonary disease with (acute) exacerbation: Secondary | ICD-10-CM | POA: Diagnosis not present

## 2022-04-08 DIAGNOSIS — J9611 Chronic respiratory failure with hypoxia: Secondary | ICD-10-CM | POA: Diagnosis not present

## 2022-04-08 DIAGNOSIS — I480 Paroxysmal atrial fibrillation: Secondary | ICD-10-CM | POA: Diagnosis not present

## 2022-04-08 DIAGNOSIS — R911 Solitary pulmonary nodule: Secondary | ICD-10-CM | POA: Diagnosis not present

## 2022-04-08 DIAGNOSIS — Z8619 Personal history of other infectious and parasitic diseases: Secondary | ICD-10-CM | POA: Diagnosis not present

## 2022-04-08 DIAGNOSIS — G894 Chronic pain syndrome: Secondary | ICD-10-CM | POA: Diagnosis not present

## 2022-04-08 DIAGNOSIS — Z720 Tobacco use: Secondary | ICD-10-CM | POA: Diagnosis not present

## 2022-04-09 DIAGNOSIS — Z720 Tobacco use: Secondary | ICD-10-CM | POA: Diagnosis not present

## 2022-04-09 DIAGNOSIS — K219 Gastro-esophageal reflux disease without esophagitis: Secondary | ICD-10-CM | POA: Diagnosis not present

## 2022-04-09 DIAGNOSIS — D72829 Elevated white blood cell count, unspecified: Secondary | ICD-10-CM | POA: Diagnosis not present

## 2022-04-09 DIAGNOSIS — J189 Pneumonia, unspecified organism: Secondary | ICD-10-CM | POA: Diagnosis not present

## 2022-04-09 DIAGNOSIS — G894 Chronic pain syndrome: Secondary | ICD-10-CM | POA: Diagnosis not present

## 2022-04-09 DIAGNOSIS — Z95828 Presence of other vascular implants and grafts: Secondary | ICD-10-CM | POA: Diagnosis not present

## 2022-04-09 DIAGNOSIS — I480 Paroxysmal atrial fibrillation: Secondary | ICD-10-CM | POA: Diagnosis not present

## 2022-04-09 DIAGNOSIS — R718 Other abnormality of red blood cells: Secondary | ICD-10-CM | POA: Diagnosis not present

## 2022-04-09 DIAGNOSIS — D735 Infarction of spleen: Secondary | ICD-10-CM | POA: Diagnosis not present

## 2022-04-09 DIAGNOSIS — J9611 Chronic respiratory failure with hypoxia: Secondary | ICD-10-CM | POA: Diagnosis not present

## 2022-04-09 DIAGNOSIS — J440 Chronic obstructive pulmonary disease with acute lower respiratory infection: Secondary | ICD-10-CM | POA: Diagnosis not present

## 2022-04-09 DIAGNOSIS — Z85048 Personal history of other malignant neoplasm of rectum, rectosigmoid junction, and anus: Secondary | ICD-10-CM | POA: Diagnosis not present

## 2022-04-09 DIAGNOSIS — D696 Thrombocytopenia, unspecified: Secondary | ICD-10-CM | POA: Diagnosis not present

## 2022-04-09 DIAGNOSIS — R911 Solitary pulmonary nodule: Secondary | ICD-10-CM | POA: Diagnosis not present

## 2022-04-09 DIAGNOSIS — Z8619 Personal history of other infectious and parasitic diseases: Secondary | ICD-10-CM | POA: Diagnosis not present

## 2022-04-09 DIAGNOSIS — K5909 Other constipation: Secondary | ICD-10-CM | POA: Diagnosis not present

## 2022-04-09 DIAGNOSIS — C210 Malignant neoplasm of anus, unspecified: Secondary | ICD-10-CM | POA: Diagnosis not present

## 2022-04-09 DIAGNOSIS — J441 Chronic obstructive pulmonary disease with (acute) exacerbation: Secondary | ICD-10-CM | POA: Diagnosis not present

## 2022-04-09 DIAGNOSIS — S22000D Wedge compression fracture of unspecified thoracic vertebra, subsequent encounter for fracture with routine healing: Secondary | ICD-10-CM | POA: Diagnosis not present

## 2022-04-10 ENCOUNTER — Encounter: Payer: Self-pay | Admitting: Physician Assistant

## 2022-04-10 ENCOUNTER — Ambulatory Visit (INDEPENDENT_AMBULATORY_CARE_PROVIDER_SITE_OTHER): Payer: Medicaid Other | Admitting: Physician Assistant

## 2022-04-10 ENCOUNTER — Ambulatory Visit (INDEPENDENT_AMBULATORY_CARE_PROVIDER_SITE_OTHER): Payer: Medicaid Other

## 2022-04-10 VITALS — BP 147/84 | HR 104 | Ht 67.0 in | Wt 119.0 lb

## 2022-04-10 DIAGNOSIS — J441 Chronic obstructive pulmonary disease with (acute) exacerbation: Secondary | ICD-10-CM

## 2022-04-10 DIAGNOSIS — J189 Pneumonia, unspecified organism: Secondary | ICD-10-CM | POA: Diagnosis not present

## 2022-04-10 DIAGNOSIS — Z8619 Personal history of other infectious and parasitic diseases: Secondary | ICD-10-CM

## 2022-04-10 DIAGNOSIS — J9611 Chronic respiratory failure with hypoxia: Secondary | ICD-10-CM | POA: Diagnosis not present

## 2022-04-10 DIAGNOSIS — J449 Chronic obstructive pulmonary disease, unspecified: Secondary | ICD-10-CM | POA: Diagnosis not present

## 2022-04-10 DIAGNOSIS — J439 Emphysema, unspecified: Secondary | ICD-10-CM | POA: Diagnosis not present

## 2022-04-10 DIAGNOSIS — K219 Gastro-esophageal reflux disease without esophagitis: Secondary | ICD-10-CM | POA: Diagnosis not present

## 2022-04-10 DIAGNOSIS — I480 Paroxysmal atrial fibrillation: Secondary | ICD-10-CM | POA: Diagnosis not present

## 2022-04-10 DIAGNOSIS — J440 Chronic obstructive pulmonary disease with acute lower respiratory infection: Secondary | ICD-10-CM | POA: Diagnosis not present

## 2022-04-10 DIAGNOSIS — Z09 Encounter for follow-up examination after completed treatment for conditions other than malignant neoplasm: Secondary | ICD-10-CM

## 2022-04-10 DIAGNOSIS — S22000D Wedge compression fracture of unspecified thoracic vertebra, subsequent encounter for fracture with routine healing: Secondary | ICD-10-CM | POA: Diagnosis not present

## 2022-04-10 DIAGNOSIS — G894 Chronic pain syndrome: Secondary | ICD-10-CM | POA: Diagnosis not present

## 2022-04-10 DIAGNOSIS — Z720 Tobacco use: Secondary | ICD-10-CM | POA: Diagnosis not present

## 2022-04-10 DIAGNOSIS — R911 Solitary pulmonary nodule: Secondary | ICD-10-CM | POA: Diagnosis not present

## 2022-04-10 DIAGNOSIS — Z85048 Personal history of other malignant neoplasm of rectum, rectosigmoid junction, and anus: Secondary | ICD-10-CM | POA: Diagnosis not present

## 2022-04-10 DIAGNOSIS — K5909 Other constipation: Secondary | ICD-10-CM | POA: Diagnosis not present

## 2022-04-10 NOTE — Progress Notes (Signed)
Established Patient Office Visit  Subjective   Patient ID: Sue Smith, female    DOB: 1955-08-03  Age: 67 y.o. MRN: [id number]  Chief Complaint  Patient presents with   Hospitalization Follow-up    HPI Pt is a 67 yo female with End Stage COPD with chronic respiratory failure who presents to the clinic for hospital follow up. She was admitted on 3/18 and discharged on 3/20 for COPD exacerbation with bilateral pneumonia of both lower lungs.     GOLD staging: PFT from 03/26/22 indicates COPD GOLD stage 4 (very severe). Patient is group D. Medications PTA include Symbicort (ICS+LABA), Spiriva )LAMA), albuterol (nebulizer and MDI). Patient wears 3L Sue Smith at baseline. Noted she wears with activity and at night. Average oxygen saturation room air at rest is "97-98%". Patient does have a pulse oximeter.   Pertinent Radiological findings (summarize): 04/01/22 0725 CT Chest WO Contrast Final result Details Impression: IMPRESSION:  1. There is some atelectasis and/or infiltrate posterior lung bases. Mild emphysema. 3. Coronary and aortic calcified atherosclerotic plaque. 4. T7 moderate to marked compression deformity is new since 11/05/2021 but otherwise of unknown age.  Pt is doing better. She has finished her antibiotic(omnicef and zpak) and finishing up vancomycin for C.diff prevention due to history.  She continues on prevention regimen.    .. 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   Simple chronic bronchitis 08/27/2016   Actinic keratosis 11/29/2016   Chronic pain syndrome 11/29/2016    Gastroesophageal reflux disease 11/29/2016   Current smoker 02/26/2017   Grief 06/30/2017   No energy 07/29/2018   Irregular heart rhythm 05/06/2019   PAC (premature atrial contraction) 05/06/2019   PVC (premature ventricular contraction) 05/06/2019   Atrial fibrillation with RVR 11/16/2019   COVID-19 virus infection 11/16/2019   Anal lesion 07/10/2020   Anal cancer 10/18/2020   Drug-induced constipation 03/26/2021   Common bile duct dilation 03/26/2021   History of rectal or anal cancer 03/26/2021   Colitis 03/26/2021   Hypocalcemia 04/30/2021   Clostridium difficile colitis 04/30/2021   Leukocytosis 05/01/2021   Unintended weight loss 05/01/2021   Compression fracture of body of thoracic vertebra 11/20/2021   Thrush 12/24/2021   End stage COPD 03/19/2022   Anxiety about health 03/19/2022   Pneumonia of both lower lobes due to infectious organism 04/15/2022   History of Clostridioides difficile infection 04/15/2022   Chronic respiratory failure with hypoxia 04/15/2022   Resolved Ambulatory Problems    Diagnosis Date Noted   Post-viral cough syndrome 11/16/2019   Past Medical History:  Diagnosis Date   Cervical dysplasia    COPD (chronic obstructive pulmonary disease) ([place])    Hypertension      ROS   See HPI.  Objective:     BP (!) 147/84   Pulse (!) 104   Ht 5\' 7"  (1.702 m)   Wt 119 lb (54 kg)   SpO2 100%   BMI 18.64 kg/m  BP Readings from Last 3 Encounters:  04/10/22 (!) 147/84  03/19/22 138/82  12/24/21 138/84   Wt Readings from Last 3 Encounters:  04/10/22 119 lb (54 kg)  03/19/22 120 lb (54.4  kg)  12/24/21 117 lb (53.1 kg)      Physical Exam Constitutional:      Appearance: She is ill-appearing.  HENT:     Head: Normocephalic.  Cardiovascular:     Rate and Rhythm: Tachycardia present.     Pulses: Normal pulses.     Heart sounds: Murmur heard.  Pulmonary:     Comments: Labored breathing on 3L of O2 Bilateral lungs with coarse breath  sounds and rales Musculoskeletal:     Cervical back: Normal range of motion and neck supple.     Right lower leg: No edema.     Left lower leg: No edema.  Lymphadenopathy:     Cervical: No cervical adenopathy.  Neurological:     General: No focal deficit present.     Mental Status: She is alert and oriented to person, place, and time.  Psychiatric:        Mood and Affect: Mood normal.        The 10-year ASCVD risk score (Arnett DK, et al., 2019) is: 15.2%    Assessment & Plan:  [name] was seen today for hospitalization follow-up.  Diagnoses and all orders for this visit:  Hospital discharge follow-up  Pneumonia of both lower lobes due to infectious organism -     DG Chest 2 View; Future  End stage COPD ([place])  History of Clostridioides difficile infection  Chronic respiratory failure with hypoxia ([place])   Pt is doing better after discharge and breathing stable on 3L of O2 continuously Her pulse ox today looks great on 3L of O2.  She has finished antibiotics and remains on vancomycin for C.diff prevention She has pulmonology appt on 4/10 CxR ordered for follow up on pneumonia Pallative care has weekly check in appts Discussed staying inside on high pollen days and using nasal saline washes regularly.    Spent 38 minutes reviewing chart and coordinating care for patient.    Iran Planas, PA-C

## 2022-04-10 NOTE — Progress Notes (Signed)
No pneumonia seen

## 2022-04-11 DIAGNOSIS — K5909 Other constipation: Secondary | ICD-10-CM | POA: Diagnosis not present

## 2022-04-11 DIAGNOSIS — J9611 Chronic respiratory failure with hypoxia: Secondary | ICD-10-CM | POA: Diagnosis not present

## 2022-04-11 DIAGNOSIS — Z85048 Personal history of other malignant neoplasm of rectum, rectosigmoid junction, and anus: Secondary | ICD-10-CM | POA: Diagnosis not present

## 2022-04-11 DIAGNOSIS — J441 Chronic obstructive pulmonary disease with (acute) exacerbation: Secondary | ICD-10-CM | POA: Diagnosis not present

## 2022-04-11 DIAGNOSIS — R911 Solitary pulmonary nodule: Secondary | ICD-10-CM | POA: Diagnosis not present

## 2022-04-11 DIAGNOSIS — Z720 Tobacco use: Secondary | ICD-10-CM | POA: Diagnosis not present

## 2022-04-11 DIAGNOSIS — I480 Paroxysmal atrial fibrillation: Secondary | ICD-10-CM | POA: Diagnosis not present

## 2022-04-11 DIAGNOSIS — Z8619 Personal history of other infectious and parasitic diseases: Secondary | ICD-10-CM | POA: Diagnosis not present

## 2022-04-11 DIAGNOSIS — S22000D Wedge compression fracture of unspecified thoracic vertebra, subsequent encounter for fracture with routine healing: Secondary | ICD-10-CM | POA: Diagnosis not present

## 2022-04-11 DIAGNOSIS — G894 Chronic pain syndrome: Secondary | ICD-10-CM | POA: Diagnosis not present

## 2022-04-11 DIAGNOSIS — J440 Chronic obstructive pulmonary disease with acute lower respiratory infection: Secondary | ICD-10-CM | POA: Diagnosis not present

## 2022-04-11 DIAGNOSIS — J189 Pneumonia, unspecified organism: Secondary | ICD-10-CM | POA: Diagnosis not present

## 2022-04-11 DIAGNOSIS — K219 Gastro-esophageal reflux disease without esophagitis: Secondary | ICD-10-CM | POA: Diagnosis not present

## 2022-04-12 DIAGNOSIS — J441 Chronic obstructive pulmonary disease with (acute) exacerbation: Secondary | ICD-10-CM | POA: Diagnosis not present

## 2022-04-12 DIAGNOSIS — I480 Paroxysmal atrial fibrillation: Secondary | ICD-10-CM | POA: Diagnosis not present

## 2022-04-12 DIAGNOSIS — K5909 Other constipation: Secondary | ICD-10-CM | POA: Diagnosis not present

## 2022-04-12 DIAGNOSIS — Z720 Tobacco use: Secondary | ICD-10-CM | POA: Diagnosis not present

## 2022-04-12 DIAGNOSIS — K219 Gastro-esophageal reflux disease without esophagitis: Secondary | ICD-10-CM | POA: Diagnosis not present

## 2022-04-12 DIAGNOSIS — G894 Chronic pain syndrome: Secondary | ICD-10-CM | POA: Diagnosis not present

## 2022-04-12 DIAGNOSIS — Z85048 Personal history of other malignant neoplasm of rectum, rectosigmoid junction, and anus: Secondary | ICD-10-CM | POA: Diagnosis not present

## 2022-04-12 DIAGNOSIS — J189 Pneumonia, unspecified organism: Secondary | ICD-10-CM | POA: Diagnosis not present

## 2022-04-12 DIAGNOSIS — J9611 Chronic respiratory failure with hypoxia: Secondary | ICD-10-CM | POA: Diagnosis not present

## 2022-04-12 DIAGNOSIS — R911 Solitary pulmonary nodule: Secondary | ICD-10-CM | POA: Diagnosis not present

## 2022-04-12 DIAGNOSIS — Z8619 Personal history of other infectious and parasitic diseases: Secondary | ICD-10-CM | POA: Diagnosis not present

## 2022-04-12 DIAGNOSIS — J440 Chronic obstructive pulmonary disease with acute lower respiratory infection: Secondary | ICD-10-CM | POA: Diagnosis not present

## 2022-04-12 DIAGNOSIS — S22000D Wedge compression fracture of unspecified thoracic vertebra, subsequent encounter for fracture with routine healing: Secondary | ICD-10-CM | POA: Diagnosis not present

## 2022-04-13 DIAGNOSIS — G894 Chronic pain syndrome: Secondary | ICD-10-CM | POA: Diagnosis not present

## 2022-04-13 DIAGNOSIS — K5909 Other constipation: Secondary | ICD-10-CM | POA: Diagnosis not present

## 2022-04-13 DIAGNOSIS — J441 Chronic obstructive pulmonary disease with (acute) exacerbation: Secondary | ICD-10-CM | POA: Diagnosis not present

## 2022-04-13 DIAGNOSIS — J440 Chronic obstructive pulmonary disease with acute lower respiratory infection: Secondary | ICD-10-CM | POA: Diagnosis not present

## 2022-04-13 DIAGNOSIS — S22000D Wedge compression fracture of unspecified thoracic vertebra, subsequent encounter for fracture with routine healing: Secondary | ICD-10-CM | POA: Diagnosis not present

## 2022-04-13 DIAGNOSIS — Z8619 Personal history of other infectious and parasitic diseases: Secondary | ICD-10-CM | POA: Diagnosis not present

## 2022-04-13 DIAGNOSIS — Z720 Tobacco use: Secondary | ICD-10-CM | POA: Diagnosis not present

## 2022-04-13 DIAGNOSIS — I480 Paroxysmal atrial fibrillation: Secondary | ICD-10-CM | POA: Diagnosis not present

## 2022-04-13 DIAGNOSIS — Z85048 Personal history of other malignant neoplasm of rectum, rectosigmoid junction, and anus: Secondary | ICD-10-CM | POA: Diagnosis not present

## 2022-04-13 DIAGNOSIS — J189 Pneumonia, unspecified organism: Secondary | ICD-10-CM | POA: Diagnosis not present

## 2022-04-13 DIAGNOSIS — K219 Gastro-esophageal reflux disease without esophagitis: Secondary | ICD-10-CM | POA: Diagnosis not present

## 2022-04-13 DIAGNOSIS — R911 Solitary pulmonary nodule: Secondary | ICD-10-CM | POA: Diagnosis not present

## 2022-04-13 DIAGNOSIS — J9611 Chronic respiratory failure with hypoxia: Secondary | ICD-10-CM | POA: Diagnosis not present

## 2022-04-14 DIAGNOSIS — J189 Pneumonia, unspecified organism: Secondary | ICD-10-CM | POA: Diagnosis not present

## 2022-04-14 DIAGNOSIS — Z85048 Personal history of other malignant neoplasm of rectum, rectosigmoid junction, and anus: Secondary | ICD-10-CM | POA: Diagnosis not present

## 2022-04-14 DIAGNOSIS — K5909 Other constipation: Secondary | ICD-10-CM | POA: Diagnosis not present

## 2022-04-14 DIAGNOSIS — K219 Gastro-esophageal reflux disease without esophagitis: Secondary | ICD-10-CM | POA: Diagnosis not present

## 2022-04-14 DIAGNOSIS — J440 Chronic obstructive pulmonary disease with acute lower respiratory infection: Secondary | ICD-10-CM | POA: Diagnosis not present

## 2022-04-14 DIAGNOSIS — Z720 Tobacco use: Secondary | ICD-10-CM | POA: Diagnosis not present

## 2022-04-14 DIAGNOSIS — I480 Paroxysmal atrial fibrillation: Secondary | ICD-10-CM | POA: Diagnosis not present

## 2022-04-14 DIAGNOSIS — S22000D Wedge compression fracture of unspecified thoracic vertebra, subsequent encounter for fracture with routine healing: Secondary | ICD-10-CM | POA: Diagnosis not present

## 2022-04-14 DIAGNOSIS — G894 Chronic pain syndrome: Secondary | ICD-10-CM | POA: Diagnosis not present

## 2022-04-14 DIAGNOSIS — J9611 Chronic respiratory failure with hypoxia: Secondary | ICD-10-CM | POA: Diagnosis not present

## 2022-04-14 DIAGNOSIS — Z8619 Personal history of other infectious and parasitic diseases: Secondary | ICD-10-CM | POA: Diagnosis not present

## 2022-04-14 DIAGNOSIS — R911 Solitary pulmonary nodule: Secondary | ICD-10-CM | POA: Diagnosis not present

## 2022-04-14 DIAGNOSIS — J441 Chronic obstructive pulmonary disease with (acute) exacerbation: Secondary | ICD-10-CM | POA: Diagnosis not present

## 2022-04-15 DIAGNOSIS — R911 Solitary pulmonary nodule: Secondary | ICD-10-CM | POA: Diagnosis not present

## 2022-04-15 DIAGNOSIS — I480 Paroxysmal atrial fibrillation: Secondary | ICD-10-CM | POA: Diagnosis not present

## 2022-04-15 DIAGNOSIS — J441 Chronic obstructive pulmonary disease with (acute) exacerbation: Secondary | ICD-10-CM | POA: Diagnosis not present

## 2022-04-15 DIAGNOSIS — Z85048 Personal history of other malignant neoplasm of rectum, rectosigmoid junction, and anus: Secondary | ICD-10-CM | POA: Diagnosis not present

## 2022-04-15 DIAGNOSIS — G894 Chronic pain syndrome: Secondary | ICD-10-CM | POA: Diagnosis not present

## 2022-04-15 DIAGNOSIS — S22000D Wedge compression fracture of unspecified thoracic vertebra, subsequent encounter for fracture with routine healing: Secondary | ICD-10-CM | POA: Diagnosis not present

## 2022-04-15 DIAGNOSIS — Z8619 Personal history of other infectious and parasitic diseases: Secondary | ICD-10-CM | POA: Diagnosis not present

## 2022-04-15 DIAGNOSIS — K5909 Other constipation: Secondary | ICD-10-CM | POA: Diagnosis not present

## 2022-04-15 DIAGNOSIS — J9611 Chronic respiratory failure with hypoxia: Secondary | ICD-10-CM | POA: Diagnosis not present

## 2022-04-15 DIAGNOSIS — Z720 Tobacco use: Secondary | ICD-10-CM | POA: Diagnosis not present

## 2022-04-15 DIAGNOSIS — K219 Gastro-esophageal reflux disease without esophagitis: Secondary | ICD-10-CM | POA: Diagnosis not present

## 2022-04-15 DIAGNOSIS — J440 Chronic obstructive pulmonary disease with acute lower respiratory infection: Secondary | ICD-10-CM | POA: Diagnosis not present

## 2022-04-15 DIAGNOSIS — J189 Pneumonia, unspecified organism: Secondary | ICD-10-CM | POA: Diagnosis not present

## 2022-04-15 DIAGNOSIS — Z419 Encounter for procedure for purposes other than remedying health state, unspecified: Secondary | ICD-10-CM | POA: Diagnosis not present

## 2022-04-16 DIAGNOSIS — J441 Chronic obstructive pulmonary disease with (acute) exacerbation: Secondary | ICD-10-CM | POA: Diagnosis not present

## 2022-04-16 DIAGNOSIS — Z85048 Personal history of other malignant neoplasm of rectum, rectosigmoid junction, and anus: Secondary | ICD-10-CM | POA: Diagnosis not present

## 2022-04-16 DIAGNOSIS — J189 Pneumonia, unspecified organism: Secondary | ICD-10-CM | POA: Diagnosis not present

## 2022-04-16 DIAGNOSIS — R911 Solitary pulmonary nodule: Secondary | ICD-10-CM | POA: Diagnosis not present

## 2022-04-16 DIAGNOSIS — J9611 Chronic respiratory failure with hypoxia: Secondary | ICD-10-CM | POA: Diagnosis not present

## 2022-04-16 DIAGNOSIS — J440 Chronic obstructive pulmonary disease with acute lower respiratory infection: Secondary | ICD-10-CM | POA: Diagnosis not present

## 2022-04-16 DIAGNOSIS — K219 Gastro-esophageal reflux disease without esophagitis: Secondary | ICD-10-CM | POA: Diagnosis not present

## 2022-04-16 DIAGNOSIS — S22000D Wedge compression fracture of unspecified thoracic vertebra, subsequent encounter for fracture with routine healing: Secondary | ICD-10-CM | POA: Diagnosis not present

## 2022-04-16 DIAGNOSIS — Z720 Tobacco use: Secondary | ICD-10-CM | POA: Diagnosis not present

## 2022-04-16 DIAGNOSIS — K5909 Other constipation: Secondary | ICD-10-CM | POA: Diagnosis not present

## 2022-04-16 DIAGNOSIS — I480 Paroxysmal atrial fibrillation: Secondary | ICD-10-CM | POA: Diagnosis not present

## 2022-04-16 DIAGNOSIS — G894 Chronic pain syndrome: Secondary | ICD-10-CM | POA: Diagnosis not present

## 2022-04-16 DIAGNOSIS — Z8619 Personal history of other infectious and parasitic diseases: Secondary | ICD-10-CM | POA: Diagnosis not present

## 2022-04-17 DIAGNOSIS — I480 Paroxysmal atrial fibrillation: Secondary | ICD-10-CM | POA: Diagnosis not present

## 2022-04-17 DIAGNOSIS — S22000D Wedge compression fracture of unspecified thoracic vertebra, subsequent encounter for fracture with routine healing: Secondary | ICD-10-CM | POA: Diagnosis not present

## 2022-04-17 DIAGNOSIS — K219 Gastro-esophageal reflux disease without esophagitis: Secondary | ICD-10-CM | POA: Diagnosis not present

## 2022-04-17 DIAGNOSIS — J440 Chronic obstructive pulmonary disease with acute lower respiratory infection: Secondary | ICD-10-CM | POA: Diagnosis not present

## 2022-04-17 DIAGNOSIS — J189 Pneumonia, unspecified organism: Secondary | ICD-10-CM | POA: Diagnosis not present

## 2022-04-17 DIAGNOSIS — G894 Chronic pain syndrome: Secondary | ICD-10-CM | POA: Diagnosis not present

## 2022-04-17 DIAGNOSIS — J9611 Chronic respiratory failure with hypoxia: Secondary | ICD-10-CM | POA: Diagnosis not present

## 2022-04-17 DIAGNOSIS — R911 Solitary pulmonary nodule: Secondary | ICD-10-CM | POA: Diagnosis not present

## 2022-04-17 DIAGNOSIS — Z8619 Personal history of other infectious and parasitic diseases: Secondary | ICD-10-CM | POA: Diagnosis not present

## 2022-04-17 DIAGNOSIS — Z720 Tobacco use: Secondary | ICD-10-CM | POA: Diagnosis not present

## 2022-04-17 DIAGNOSIS — J441 Chronic obstructive pulmonary disease with (acute) exacerbation: Secondary | ICD-10-CM | POA: Diagnosis not present

## 2022-04-17 DIAGNOSIS — Z85048 Personal history of other malignant neoplasm of rectum, rectosigmoid junction, and anus: Secondary | ICD-10-CM | POA: Diagnosis not present

## 2022-04-17 DIAGNOSIS — K5909 Other constipation: Secondary | ICD-10-CM | POA: Diagnosis not present

## 2022-04-18 DIAGNOSIS — J9611 Chronic respiratory failure with hypoxia: Secondary | ICD-10-CM | POA: Diagnosis not present

## 2022-04-18 DIAGNOSIS — J441 Chronic obstructive pulmonary disease with (acute) exacerbation: Secondary | ICD-10-CM | POA: Diagnosis not present

## 2022-04-18 DIAGNOSIS — S22000D Wedge compression fracture of unspecified thoracic vertebra, subsequent encounter for fracture with routine healing: Secondary | ICD-10-CM | POA: Diagnosis not present

## 2022-04-18 DIAGNOSIS — J189 Pneumonia, unspecified organism: Secondary | ICD-10-CM | POA: Diagnosis not present

## 2022-04-18 DIAGNOSIS — J440 Chronic obstructive pulmonary disease with acute lower respiratory infection: Secondary | ICD-10-CM | POA: Diagnosis not present

## 2022-04-18 DIAGNOSIS — Z85048 Personal history of other malignant neoplasm of rectum, rectosigmoid junction, and anus: Secondary | ICD-10-CM | POA: Diagnosis not present

## 2022-04-18 DIAGNOSIS — I480 Paroxysmal atrial fibrillation: Secondary | ICD-10-CM | POA: Diagnosis not present

## 2022-04-18 DIAGNOSIS — K219 Gastro-esophageal reflux disease without esophagitis: Secondary | ICD-10-CM | POA: Diagnosis not present

## 2022-04-18 DIAGNOSIS — R911 Solitary pulmonary nodule: Secondary | ICD-10-CM | POA: Diagnosis not present

## 2022-04-18 DIAGNOSIS — G894 Chronic pain syndrome: Secondary | ICD-10-CM | POA: Diagnosis not present

## 2022-04-18 DIAGNOSIS — K5909 Other constipation: Secondary | ICD-10-CM | POA: Diagnosis not present

## 2022-04-18 DIAGNOSIS — Z8619 Personal history of other infectious and parasitic diseases: Secondary | ICD-10-CM | POA: Diagnosis not present

## 2022-04-18 DIAGNOSIS — Z720 Tobacco use: Secondary | ICD-10-CM | POA: Diagnosis not present

## 2022-04-19 DIAGNOSIS — Z85048 Personal history of other malignant neoplasm of rectum, rectosigmoid junction, and anus: Secondary | ICD-10-CM | POA: Diagnosis not present

## 2022-04-19 DIAGNOSIS — Z8619 Personal history of other infectious and parasitic diseases: Secondary | ICD-10-CM | POA: Diagnosis not present

## 2022-04-19 DIAGNOSIS — J441 Chronic obstructive pulmonary disease with (acute) exacerbation: Secondary | ICD-10-CM | POA: Diagnosis not present

## 2022-04-19 DIAGNOSIS — J189 Pneumonia, unspecified organism: Secondary | ICD-10-CM | POA: Diagnosis not present

## 2022-04-19 DIAGNOSIS — I480 Paroxysmal atrial fibrillation: Secondary | ICD-10-CM | POA: Diagnosis not present

## 2022-04-19 DIAGNOSIS — J9611 Chronic respiratory failure with hypoxia: Secondary | ICD-10-CM | POA: Diagnosis not present

## 2022-04-19 DIAGNOSIS — J440 Chronic obstructive pulmonary disease with acute lower respiratory infection: Secondary | ICD-10-CM | POA: Diagnosis not present

## 2022-04-19 DIAGNOSIS — S22000D Wedge compression fracture of unspecified thoracic vertebra, subsequent encounter for fracture with routine healing: Secondary | ICD-10-CM | POA: Diagnosis not present

## 2022-04-19 DIAGNOSIS — K5909 Other constipation: Secondary | ICD-10-CM | POA: Diagnosis not present

## 2022-04-19 DIAGNOSIS — Z720 Tobacco use: Secondary | ICD-10-CM | POA: Diagnosis not present

## 2022-04-19 DIAGNOSIS — K219 Gastro-esophageal reflux disease without esophagitis: Secondary | ICD-10-CM | POA: Diagnosis not present

## 2022-04-19 DIAGNOSIS — G894 Chronic pain syndrome: Secondary | ICD-10-CM | POA: Diagnosis not present

## 2022-04-19 DIAGNOSIS — R911 Solitary pulmonary nodule: Secondary | ICD-10-CM | POA: Diagnosis not present

## 2022-04-20 DIAGNOSIS — J189 Pneumonia, unspecified organism: Secondary | ICD-10-CM | POA: Diagnosis not present

## 2022-04-20 DIAGNOSIS — Z8619 Personal history of other infectious and parasitic diseases: Secondary | ICD-10-CM | POA: Diagnosis not present

## 2022-04-20 DIAGNOSIS — G894 Chronic pain syndrome: Secondary | ICD-10-CM | POA: Diagnosis not present

## 2022-04-20 DIAGNOSIS — K5909 Other constipation: Secondary | ICD-10-CM | POA: Diagnosis not present

## 2022-04-20 DIAGNOSIS — J440 Chronic obstructive pulmonary disease with acute lower respiratory infection: Secondary | ICD-10-CM | POA: Diagnosis not present

## 2022-04-20 DIAGNOSIS — R911 Solitary pulmonary nodule: Secondary | ICD-10-CM | POA: Diagnosis not present

## 2022-04-20 DIAGNOSIS — S22000D Wedge compression fracture of unspecified thoracic vertebra, subsequent encounter for fracture with routine healing: Secondary | ICD-10-CM | POA: Diagnosis not present

## 2022-04-20 DIAGNOSIS — J9611 Chronic respiratory failure with hypoxia: Secondary | ICD-10-CM | POA: Diagnosis not present

## 2022-04-20 DIAGNOSIS — K219 Gastro-esophageal reflux disease without esophagitis: Secondary | ICD-10-CM | POA: Diagnosis not present

## 2022-04-20 DIAGNOSIS — Z85048 Personal history of other malignant neoplasm of rectum, rectosigmoid junction, and anus: Secondary | ICD-10-CM | POA: Diagnosis not present

## 2022-04-20 DIAGNOSIS — I480 Paroxysmal atrial fibrillation: Secondary | ICD-10-CM | POA: Diagnosis not present

## 2022-04-20 DIAGNOSIS — J441 Chronic obstructive pulmonary disease with (acute) exacerbation: Secondary | ICD-10-CM | POA: Diagnosis not present

## 2022-04-20 DIAGNOSIS — Z720 Tobacco use: Secondary | ICD-10-CM | POA: Diagnosis not present

## 2022-04-21 DIAGNOSIS — J9611 Chronic respiratory failure with hypoxia: Secondary | ICD-10-CM | POA: Diagnosis not present

## 2022-04-21 DIAGNOSIS — Z8619 Personal history of other infectious and parasitic diseases: Secondary | ICD-10-CM | POA: Diagnosis not present

## 2022-04-21 DIAGNOSIS — Z720 Tobacco use: Secondary | ICD-10-CM | POA: Diagnosis not present

## 2022-04-21 DIAGNOSIS — I480 Paroxysmal atrial fibrillation: Secondary | ICD-10-CM | POA: Diagnosis not present

## 2022-04-21 DIAGNOSIS — J189 Pneumonia, unspecified organism: Secondary | ICD-10-CM | POA: Diagnosis not present

## 2022-04-21 DIAGNOSIS — K5909 Other constipation: Secondary | ICD-10-CM | POA: Diagnosis not present

## 2022-04-21 DIAGNOSIS — S22000D Wedge compression fracture of unspecified thoracic vertebra, subsequent encounter for fracture with routine healing: Secondary | ICD-10-CM | POA: Diagnosis not present

## 2022-04-21 DIAGNOSIS — J441 Chronic obstructive pulmonary disease with (acute) exacerbation: Secondary | ICD-10-CM | POA: Diagnosis not present

## 2022-04-21 DIAGNOSIS — K219 Gastro-esophageal reflux disease without esophagitis: Secondary | ICD-10-CM | POA: Diagnosis not present

## 2022-04-21 DIAGNOSIS — R911 Solitary pulmonary nodule: Secondary | ICD-10-CM | POA: Diagnosis not present

## 2022-04-21 DIAGNOSIS — G894 Chronic pain syndrome: Secondary | ICD-10-CM | POA: Diagnosis not present

## 2022-04-21 DIAGNOSIS — Z85048 Personal history of other malignant neoplasm of rectum, rectosigmoid junction, and anus: Secondary | ICD-10-CM | POA: Diagnosis not present

## 2022-04-21 DIAGNOSIS — J440 Chronic obstructive pulmonary disease with acute lower respiratory infection: Secondary | ICD-10-CM | POA: Diagnosis not present

## 2022-04-22 DIAGNOSIS — J441 Chronic obstructive pulmonary disease with (acute) exacerbation: Secondary | ICD-10-CM | POA: Diagnosis not present

## 2022-04-22 DIAGNOSIS — Z720 Tobacco use: Secondary | ICD-10-CM | POA: Diagnosis not present

## 2022-04-22 DIAGNOSIS — S22000D Wedge compression fracture of unspecified thoracic vertebra, subsequent encounter for fracture with routine healing: Secondary | ICD-10-CM | POA: Diagnosis not present

## 2022-04-22 DIAGNOSIS — Z85048 Personal history of other malignant neoplasm of rectum, rectosigmoid junction, and anus: Secondary | ICD-10-CM | POA: Diagnosis not present

## 2022-04-22 DIAGNOSIS — R911 Solitary pulmonary nodule: Secondary | ICD-10-CM | POA: Diagnosis not present

## 2022-04-22 DIAGNOSIS — Z8619 Personal history of other infectious and parasitic diseases: Secondary | ICD-10-CM | POA: Diagnosis not present

## 2022-04-22 DIAGNOSIS — K219 Gastro-esophageal reflux disease without esophagitis: Secondary | ICD-10-CM | POA: Diagnosis not present

## 2022-04-22 DIAGNOSIS — J189 Pneumonia, unspecified organism: Secondary | ICD-10-CM | POA: Diagnosis not present

## 2022-04-22 DIAGNOSIS — K5909 Other constipation: Secondary | ICD-10-CM | POA: Diagnosis not present

## 2022-04-22 DIAGNOSIS — I480 Paroxysmal atrial fibrillation: Secondary | ICD-10-CM | POA: Diagnosis not present

## 2022-04-22 DIAGNOSIS — J9611 Chronic respiratory failure with hypoxia: Secondary | ICD-10-CM | POA: Diagnosis not present

## 2022-04-22 DIAGNOSIS — J440 Chronic obstructive pulmonary disease with acute lower respiratory infection: Secondary | ICD-10-CM | POA: Diagnosis not present

## 2022-04-22 DIAGNOSIS — G894 Chronic pain syndrome: Secondary | ICD-10-CM | POA: Diagnosis not present

## 2022-04-23 DIAGNOSIS — J441 Chronic obstructive pulmonary disease with (acute) exacerbation: Secondary | ICD-10-CM | POA: Diagnosis not present

## 2022-04-23 DIAGNOSIS — G894 Chronic pain syndrome: Secondary | ICD-10-CM | POA: Diagnosis not present

## 2022-04-23 DIAGNOSIS — Z8619 Personal history of other infectious and parasitic diseases: Secondary | ICD-10-CM | POA: Diagnosis not present

## 2022-04-23 DIAGNOSIS — Z720 Tobacco use: Secondary | ICD-10-CM | POA: Diagnosis not present

## 2022-04-23 DIAGNOSIS — K219 Gastro-esophageal reflux disease without esophagitis: Secondary | ICD-10-CM | POA: Diagnosis not present

## 2022-04-23 DIAGNOSIS — J189 Pneumonia, unspecified organism: Secondary | ICD-10-CM | POA: Diagnosis not present

## 2022-04-23 DIAGNOSIS — S22000D Wedge compression fracture of unspecified thoracic vertebra, subsequent encounter for fracture with routine healing: Secondary | ICD-10-CM | POA: Diagnosis not present

## 2022-04-23 DIAGNOSIS — R911 Solitary pulmonary nodule: Secondary | ICD-10-CM | POA: Diagnosis not present

## 2022-04-23 DIAGNOSIS — K5909 Other constipation: Secondary | ICD-10-CM | POA: Diagnosis not present

## 2022-04-23 DIAGNOSIS — I480 Paroxysmal atrial fibrillation: Secondary | ICD-10-CM | POA: Diagnosis not present

## 2022-04-23 DIAGNOSIS — Z85048 Personal history of other malignant neoplasm of rectum, rectosigmoid junction, and anus: Secondary | ICD-10-CM | POA: Diagnosis not present

## 2022-04-23 DIAGNOSIS — J440 Chronic obstructive pulmonary disease with acute lower respiratory infection: Secondary | ICD-10-CM | POA: Diagnosis not present

## 2022-04-23 DIAGNOSIS — J9611 Chronic respiratory failure with hypoxia: Secondary | ICD-10-CM | POA: Diagnosis not present

## 2022-04-24 ENCOUNTER — Encounter: Payer: Self-pay | Admitting: Physician Assistant

## 2022-04-24 DIAGNOSIS — I480 Paroxysmal atrial fibrillation: Secondary | ICD-10-CM | POA: Diagnosis not present

## 2022-04-24 DIAGNOSIS — Z720 Tobacco use: Secondary | ICD-10-CM | POA: Diagnosis not present

## 2022-04-24 DIAGNOSIS — R911 Solitary pulmonary nodule: Secondary | ICD-10-CM | POA: Diagnosis not present

## 2022-04-24 DIAGNOSIS — G894 Chronic pain syndrome: Secondary | ICD-10-CM | POA: Diagnosis not present

## 2022-04-24 DIAGNOSIS — K219 Gastro-esophageal reflux disease without esophagitis: Secondary | ICD-10-CM | POA: Diagnosis not present

## 2022-04-24 DIAGNOSIS — J440 Chronic obstructive pulmonary disease with acute lower respiratory infection: Secondary | ICD-10-CM | POA: Diagnosis not present

## 2022-04-24 DIAGNOSIS — J9611 Chronic respiratory failure with hypoxia: Secondary | ICD-10-CM | POA: Diagnosis not present

## 2022-04-24 DIAGNOSIS — Z85048 Personal history of other malignant neoplasm of rectum, rectosigmoid junction, and anus: Secondary | ICD-10-CM | POA: Diagnosis not present

## 2022-04-24 DIAGNOSIS — K5909 Other constipation: Secondary | ICD-10-CM | POA: Diagnosis not present

## 2022-04-24 DIAGNOSIS — J3089 Other allergic rhinitis: Secondary | ICD-10-CM | POA: Diagnosis not present

## 2022-04-24 DIAGNOSIS — F17210 Nicotine dependence, cigarettes, uncomplicated: Secondary | ICD-10-CM | POA: Diagnosis not present

## 2022-04-24 DIAGNOSIS — J441 Chronic obstructive pulmonary disease with (acute) exacerbation: Secondary | ICD-10-CM | POA: Diagnosis not present

## 2022-04-24 DIAGNOSIS — J189 Pneumonia, unspecified organism: Secondary | ICD-10-CM | POA: Diagnosis not present

## 2022-04-24 DIAGNOSIS — J449 Chronic obstructive pulmonary disease, unspecified: Secondary | ICD-10-CM | POA: Diagnosis not present

## 2022-04-24 DIAGNOSIS — S22000D Wedge compression fracture of unspecified thoracic vertebra, subsequent encounter for fracture with routine healing: Secondary | ICD-10-CM | POA: Diagnosis not present

## 2022-04-24 DIAGNOSIS — Z8619 Personal history of other infectious and parasitic diseases: Secondary | ICD-10-CM | POA: Diagnosis not present

## 2022-04-24 DIAGNOSIS — Z9981 Dependence on supplemental oxygen: Secondary | ICD-10-CM | POA: Diagnosis not present

## 2022-04-25 DIAGNOSIS — J9611 Chronic respiratory failure with hypoxia: Secondary | ICD-10-CM | POA: Diagnosis not present

## 2022-04-25 DIAGNOSIS — S22000D Wedge compression fracture of unspecified thoracic vertebra, subsequent encounter for fracture with routine healing: Secondary | ICD-10-CM | POA: Diagnosis not present

## 2022-04-25 DIAGNOSIS — Z720 Tobacco use: Secondary | ICD-10-CM | POA: Diagnosis not present

## 2022-04-25 DIAGNOSIS — Z8619 Personal history of other infectious and parasitic diseases: Secondary | ICD-10-CM | POA: Diagnosis not present

## 2022-04-25 DIAGNOSIS — K5909 Other constipation: Secondary | ICD-10-CM | POA: Diagnosis not present

## 2022-04-25 DIAGNOSIS — K219 Gastro-esophageal reflux disease without esophagitis: Secondary | ICD-10-CM | POA: Diagnosis not present

## 2022-04-25 DIAGNOSIS — I480 Paroxysmal atrial fibrillation: Secondary | ICD-10-CM | POA: Diagnosis not present

## 2022-04-25 DIAGNOSIS — J440 Chronic obstructive pulmonary disease with acute lower respiratory infection: Secondary | ICD-10-CM | POA: Diagnosis not present

## 2022-04-25 DIAGNOSIS — R911 Solitary pulmonary nodule: Secondary | ICD-10-CM | POA: Diagnosis not present

## 2022-04-25 DIAGNOSIS — J441 Chronic obstructive pulmonary disease with (acute) exacerbation: Secondary | ICD-10-CM | POA: Diagnosis not present

## 2022-04-25 DIAGNOSIS — Z85048 Personal history of other malignant neoplasm of rectum, rectosigmoid junction, and anus: Secondary | ICD-10-CM | POA: Diagnosis not present

## 2022-04-25 DIAGNOSIS — J189 Pneumonia, unspecified organism: Secondary | ICD-10-CM | POA: Diagnosis not present

## 2022-04-25 DIAGNOSIS — G894 Chronic pain syndrome: Secondary | ICD-10-CM | POA: Diagnosis not present

## 2022-04-26 DIAGNOSIS — K219 Gastro-esophageal reflux disease without esophagitis: Secondary | ICD-10-CM | POA: Diagnosis not present

## 2022-04-26 DIAGNOSIS — S22000D Wedge compression fracture of unspecified thoracic vertebra, subsequent encounter for fracture with routine healing: Secondary | ICD-10-CM | POA: Diagnosis not present

## 2022-04-26 DIAGNOSIS — I480 Paroxysmal atrial fibrillation: Secondary | ICD-10-CM | POA: Diagnosis not present

## 2022-04-26 DIAGNOSIS — J441 Chronic obstructive pulmonary disease with (acute) exacerbation: Secondary | ICD-10-CM | POA: Diagnosis not present

## 2022-04-26 DIAGNOSIS — J9611 Chronic respiratory failure with hypoxia: Secondary | ICD-10-CM | POA: Diagnosis not present

## 2022-04-26 DIAGNOSIS — R911 Solitary pulmonary nodule: Secondary | ICD-10-CM | POA: Diagnosis not present

## 2022-04-26 DIAGNOSIS — Z8619 Personal history of other infectious and parasitic diseases: Secondary | ICD-10-CM | POA: Diagnosis not present

## 2022-04-26 DIAGNOSIS — K5909 Other constipation: Secondary | ICD-10-CM | POA: Diagnosis not present

## 2022-04-26 DIAGNOSIS — G894 Chronic pain syndrome: Secondary | ICD-10-CM | POA: Diagnosis not present

## 2022-04-26 DIAGNOSIS — Z85048 Personal history of other malignant neoplasm of rectum, rectosigmoid junction, and anus: Secondary | ICD-10-CM | POA: Diagnosis not present

## 2022-04-26 DIAGNOSIS — J189 Pneumonia, unspecified organism: Secondary | ICD-10-CM | POA: Diagnosis not present

## 2022-04-26 DIAGNOSIS — Z720 Tobacco use: Secondary | ICD-10-CM | POA: Diagnosis not present

## 2022-04-26 DIAGNOSIS — J440 Chronic obstructive pulmonary disease with acute lower respiratory infection: Secondary | ICD-10-CM | POA: Diagnosis not present

## 2022-04-27 DIAGNOSIS — K5909 Other constipation: Secondary | ICD-10-CM | POA: Diagnosis not present

## 2022-04-27 DIAGNOSIS — S22000D Wedge compression fracture of unspecified thoracic vertebra, subsequent encounter for fracture with routine healing: Secondary | ICD-10-CM | POA: Diagnosis not present

## 2022-04-27 DIAGNOSIS — Z85048 Personal history of other malignant neoplasm of rectum, rectosigmoid junction, and anus: Secondary | ICD-10-CM | POA: Diagnosis not present

## 2022-04-27 DIAGNOSIS — J440 Chronic obstructive pulmonary disease with acute lower respiratory infection: Secondary | ICD-10-CM | POA: Diagnosis not present

## 2022-04-27 DIAGNOSIS — G894 Chronic pain syndrome: Secondary | ICD-10-CM | POA: Diagnosis not present

## 2022-04-27 DIAGNOSIS — I480 Paroxysmal atrial fibrillation: Secondary | ICD-10-CM | POA: Diagnosis not present

## 2022-04-27 DIAGNOSIS — K219 Gastro-esophageal reflux disease without esophagitis: Secondary | ICD-10-CM | POA: Diagnosis not present

## 2022-04-27 DIAGNOSIS — Z720 Tobacco use: Secondary | ICD-10-CM | POA: Diagnosis not present

## 2022-04-27 DIAGNOSIS — J9611 Chronic respiratory failure with hypoxia: Secondary | ICD-10-CM | POA: Diagnosis not present

## 2022-04-27 DIAGNOSIS — Z8619 Personal history of other infectious and parasitic diseases: Secondary | ICD-10-CM | POA: Diagnosis not present

## 2022-04-27 DIAGNOSIS — J441 Chronic obstructive pulmonary disease with (acute) exacerbation: Secondary | ICD-10-CM | POA: Diagnosis not present

## 2022-04-27 DIAGNOSIS — J189 Pneumonia, unspecified organism: Secondary | ICD-10-CM | POA: Diagnosis not present

## 2022-04-27 DIAGNOSIS — R911 Solitary pulmonary nodule: Secondary | ICD-10-CM | POA: Diagnosis not present

## 2022-04-28 DIAGNOSIS — G894 Chronic pain syndrome: Secondary | ICD-10-CM | POA: Diagnosis not present

## 2022-04-28 DIAGNOSIS — J441 Chronic obstructive pulmonary disease with (acute) exacerbation: Secondary | ICD-10-CM | POA: Diagnosis not present

## 2022-04-28 DIAGNOSIS — R911 Solitary pulmonary nodule: Secondary | ICD-10-CM | POA: Diagnosis not present

## 2022-04-28 DIAGNOSIS — Z85048 Personal history of other malignant neoplasm of rectum, rectosigmoid junction, and anus: Secondary | ICD-10-CM | POA: Diagnosis not present

## 2022-04-28 DIAGNOSIS — K5909 Other constipation: Secondary | ICD-10-CM | POA: Diagnosis not present

## 2022-04-28 DIAGNOSIS — I480 Paroxysmal atrial fibrillation: Secondary | ICD-10-CM | POA: Diagnosis not present

## 2022-04-28 DIAGNOSIS — J440 Chronic obstructive pulmonary disease with acute lower respiratory infection: Secondary | ICD-10-CM | POA: Diagnosis not present

## 2022-04-28 DIAGNOSIS — J9611 Chronic respiratory failure with hypoxia: Secondary | ICD-10-CM | POA: Diagnosis not present

## 2022-04-28 DIAGNOSIS — K219 Gastro-esophageal reflux disease without esophagitis: Secondary | ICD-10-CM | POA: Diagnosis not present

## 2022-04-28 DIAGNOSIS — J189 Pneumonia, unspecified organism: Secondary | ICD-10-CM | POA: Diagnosis not present

## 2022-04-28 DIAGNOSIS — Z8619 Personal history of other infectious and parasitic diseases: Secondary | ICD-10-CM | POA: Diagnosis not present

## 2022-04-28 DIAGNOSIS — S22000D Wedge compression fracture of unspecified thoracic vertebra, subsequent encounter for fracture with routine healing: Secondary | ICD-10-CM | POA: Diagnosis not present

## 2022-04-28 DIAGNOSIS — Z720 Tobacco use: Secondary | ICD-10-CM | POA: Diagnosis not present

## 2022-04-29 DIAGNOSIS — I480 Paroxysmal atrial fibrillation: Secondary | ICD-10-CM | POA: Diagnosis not present

## 2022-04-29 DIAGNOSIS — Z8619 Personal history of other infectious and parasitic diseases: Secondary | ICD-10-CM | POA: Diagnosis not present

## 2022-04-29 DIAGNOSIS — R911 Solitary pulmonary nodule: Secondary | ICD-10-CM | POA: Diagnosis not present

## 2022-04-29 DIAGNOSIS — Z720 Tobacco use: Secondary | ICD-10-CM | POA: Diagnosis not present

## 2022-04-29 DIAGNOSIS — G894 Chronic pain syndrome: Secondary | ICD-10-CM | POA: Diagnosis not present

## 2022-04-29 DIAGNOSIS — J440 Chronic obstructive pulmonary disease with acute lower respiratory infection: Secondary | ICD-10-CM | POA: Diagnosis not present

## 2022-04-29 DIAGNOSIS — J441 Chronic obstructive pulmonary disease with (acute) exacerbation: Secondary | ICD-10-CM | POA: Diagnosis not present

## 2022-04-29 DIAGNOSIS — Z85048 Personal history of other malignant neoplasm of rectum, rectosigmoid junction, and anus: Secondary | ICD-10-CM | POA: Diagnosis not present

## 2022-04-29 DIAGNOSIS — K5909 Other constipation: Secondary | ICD-10-CM | POA: Diagnosis not present

## 2022-04-29 DIAGNOSIS — K219 Gastro-esophageal reflux disease without esophagitis: Secondary | ICD-10-CM | POA: Diagnosis not present

## 2022-04-29 DIAGNOSIS — J189 Pneumonia, unspecified organism: Secondary | ICD-10-CM | POA: Diagnosis not present

## 2022-04-29 DIAGNOSIS — J9611 Chronic respiratory failure with hypoxia: Secondary | ICD-10-CM | POA: Diagnosis not present

## 2022-04-29 DIAGNOSIS — S22000D Wedge compression fracture of unspecified thoracic vertebra, subsequent encounter for fracture with routine healing: Secondary | ICD-10-CM | POA: Diagnosis not present

## 2022-04-30 DIAGNOSIS — Z8619 Personal history of other infectious and parasitic diseases: Secondary | ICD-10-CM | POA: Diagnosis not present

## 2022-04-30 DIAGNOSIS — I480 Paroxysmal atrial fibrillation: Secondary | ICD-10-CM | POA: Diagnosis not present

## 2022-04-30 DIAGNOSIS — J440 Chronic obstructive pulmonary disease with acute lower respiratory infection: Secondary | ICD-10-CM | POA: Diagnosis not present

## 2022-04-30 DIAGNOSIS — S22000D Wedge compression fracture of unspecified thoracic vertebra, subsequent encounter for fracture with routine healing: Secondary | ICD-10-CM | POA: Diagnosis not present

## 2022-04-30 DIAGNOSIS — K5909 Other constipation: Secondary | ICD-10-CM | POA: Diagnosis not present

## 2022-04-30 DIAGNOSIS — G894 Chronic pain syndrome: Secondary | ICD-10-CM | POA: Diagnosis not present

## 2022-04-30 DIAGNOSIS — Z720 Tobacco use: Secondary | ICD-10-CM | POA: Diagnosis not present

## 2022-04-30 DIAGNOSIS — Z85048 Personal history of other malignant neoplasm of rectum, rectosigmoid junction, and anus: Secondary | ICD-10-CM | POA: Diagnosis not present

## 2022-04-30 DIAGNOSIS — J9611 Chronic respiratory failure with hypoxia: Secondary | ICD-10-CM | POA: Diagnosis not present

## 2022-04-30 DIAGNOSIS — J189 Pneumonia, unspecified organism: Secondary | ICD-10-CM | POA: Diagnosis not present

## 2022-04-30 DIAGNOSIS — J441 Chronic obstructive pulmonary disease with (acute) exacerbation: Secondary | ICD-10-CM | POA: Diagnosis not present

## 2022-04-30 DIAGNOSIS — R911 Solitary pulmonary nodule: Secondary | ICD-10-CM | POA: Diagnosis not present

## 2022-04-30 DIAGNOSIS — K219 Gastro-esophageal reflux disease without esophagitis: Secondary | ICD-10-CM | POA: Diagnosis not present

## 2022-05-01 DIAGNOSIS — Z85048 Personal history of other malignant neoplasm of rectum, rectosigmoid junction, and anus: Secondary | ICD-10-CM | POA: Diagnosis not present

## 2022-05-01 DIAGNOSIS — J9611 Chronic respiratory failure with hypoxia: Secondary | ICD-10-CM | POA: Diagnosis not present

## 2022-05-01 DIAGNOSIS — J189 Pneumonia, unspecified organism: Secondary | ICD-10-CM | POA: Diagnosis not present

## 2022-05-01 DIAGNOSIS — G894 Chronic pain syndrome: Secondary | ICD-10-CM | POA: Diagnosis not present

## 2022-05-01 DIAGNOSIS — Z8619 Personal history of other infectious and parasitic diseases: Secondary | ICD-10-CM | POA: Diagnosis not present

## 2022-05-01 DIAGNOSIS — K219 Gastro-esophageal reflux disease without esophagitis: Secondary | ICD-10-CM | POA: Diagnosis not present

## 2022-05-01 DIAGNOSIS — J441 Chronic obstructive pulmonary disease with (acute) exacerbation: Secondary | ICD-10-CM | POA: Diagnosis not present

## 2022-05-01 DIAGNOSIS — Z720 Tobacco use: Secondary | ICD-10-CM | POA: Diagnosis not present

## 2022-05-01 DIAGNOSIS — S22000D Wedge compression fracture of unspecified thoracic vertebra, subsequent encounter for fracture with routine healing: Secondary | ICD-10-CM | POA: Diagnosis not present

## 2022-05-01 DIAGNOSIS — J440 Chronic obstructive pulmonary disease with acute lower respiratory infection: Secondary | ICD-10-CM | POA: Diagnosis not present

## 2022-05-01 DIAGNOSIS — I480 Paroxysmal atrial fibrillation: Secondary | ICD-10-CM | POA: Diagnosis not present

## 2022-05-01 DIAGNOSIS — K5909 Other constipation: Secondary | ICD-10-CM | POA: Diagnosis not present

## 2022-05-01 DIAGNOSIS — R911 Solitary pulmonary nodule: Secondary | ICD-10-CM | POA: Diagnosis not present

## 2022-05-02 DIAGNOSIS — J9611 Chronic respiratory failure with hypoxia: Secondary | ICD-10-CM | POA: Diagnosis not present

## 2022-05-02 DIAGNOSIS — I480 Paroxysmal atrial fibrillation: Secondary | ICD-10-CM | POA: Diagnosis not present

## 2022-05-02 DIAGNOSIS — J441 Chronic obstructive pulmonary disease with (acute) exacerbation: Secondary | ICD-10-CM | POA: Diagnosis not present

## 2022-05-02 DIAGNOSIS — J189 Pneumonia, unspecified organism: Secondary | ICD-10-CM | POA: Diagnosis not present

## 2022-05-02 DIAGNOSIS — K5909 Other constipation: Secondary | ICD-10-CM | POA: Diagnosis not present

## 2022-05-02 DIAGNOSIS — S22000D Wedge compression fracture of unspecified thoracic vertebra, subsequent encounter for fracture with routine healing: Secondary | ICD-10-CM | POA: Diagnosis not present

## 2022-05-02 DIAGNOSIS — Z720 Tobacco use: Secondary | ICD-10-CM | POA: Diagnosis not present

## 2022-05-02 DIAGNOSIS — K219 Gastro-esophageal reflux disease without esophagitis: Secondary | ICD-10-CM | POA: Diagnosis not present

## 2022-05-02 DIAGNOSIS — R911 Solitary pulmonary nodule: Secondary | ICD-10-CM | POA: Diagnosis not present

## 2022-05-02 DIAGNOSIS — J440 Chronic obstructive pulmonary disease with acute lower respiratory infection: Secondary | ICD-10-CM | POA: Diagnosis not present

## 2022-05-02 DIAGNOSIS — Z8619 Personal history of other infectious and parasitic diseases: Secondary | ICD-10-CM | POA: Diagnosis not present

## 2022-05-02 DIAGNOSIS — G894 Chronic pain syndrome: Secondary | ICD-10-CM | POA: Diagnosis not present

## 2022-05-02 DIAGNOSIS — Z85048 Personal history of other malignant neoplasm of rectum, rectosigmoid junction, and anus: Secondary | ICD-10-CM | POA: Diagnosis not present

## 2022-05-03 DIAGNOSIS — K219 Gastro-esophageal reflux disease without esophagitis: Secondary | ICD-10-CM | POA: Diagnosis not present

## 2022-05-03 DIAGNOSIS — Z8619 Personal history of other infectious and parasitic diseases: Secondary | ICD-10-CM | POA: Diagnosis not present

## 2022-05-03 DIAGNOSIS — J441 Chronic obstructive pulmonary disease with (acute) exacerbation: Secondary | ICD-10-CM | POA: Diagnosis not present

## 2022-05-03 DIAGNOSIS — G894 Chronic pain syndrome: Secondary | ICD-10-CM | POA: Diagnosis not present

## 2022-05-03 DIAGNOSIS — S22000D Wedge compression fracture of unspecified thoracic vertebra, subsequent encounter for fracture with routine healing: Secondary | ICD-10-CM | POA: Diagnosis not present

## 2022-05-03 DIAGNOSIS — J9611 Chronic respiratory failure with hypoxia: Secondary | ICD-10-CM | POA: Diagnosis not present

## 2022-05-03 DIAGNOSIS — R911 Solitary pulmonary nodule: Secondary | ICD-10-CM | POA: Diagnosis not present

## 2022-05-03 DIAGNOSIS — K5909 Other constipation: Secondary | ICD-10-CM | POA: Diagnosis not present

## 2022-05-03 DIAGNOSIS — J189 Pneumonia, unspecified organism: Secondary | ICD-10-CM | POA: Diagnosis not present

## 2022-05-03 DIAGNOSIS — J440 Chronic obstructive pulmonary disease with acute lower respiratory infection: Secondary | ICD-10-CM | POA: Diagnosis not present

## 2022-05-03 DIAGNOSIS — I480 Paroxysmal atrial fibrillation: Secondary | ICD-10-CM | POA: Diagnosis not present

## 2022-05-03 DIAGNOSIS — Z85048 Personal history of other malignant neoplasm of rectum, rectosigmoid junction, and anus: Secondary | ICD-10-CM | POA: Diagnosis not present

## 2022-05-03 DIAGNOSIS — Z720 Tobacco use: Secondary | ICD-10-CM | POA: Diagnosis not present

## 2022-05-04 DIAGNOSIS — J441 Chronic obstructive pulmonary disease with (acute) exacerbation: Secondary | ICD-10-CM | POA: Diagnosis not present

## 2022-05-04 DIAGNOSIS — I480 Paroxysmal atrial fibrillation: Secondary | ICD-10-CM | POA: Diagnosis not present

## 2022-05-04 DIAGNOSIS — G894 Chronic pain syndrome: Secondary | ICD-10-CM | POA: Diagnosis not present

## 2022-05-04 DIAGNOSIS — Z720 Tobacco use: Secondary | ICD-10-CM | POA: Diagnosis not present

## 2022-05-04 DIAGNOSIS — R911 Solitary pulmonary nodule: Secondary | ICD-10-CM | POA: Diagnosis not present

## 2022-05-04 DIAGNOSIS — K219 Gastro-esophageal reflux disease without esophagitis: Secondary | ICD-10-CM | POA: Diagnosis not present

## 2022-05-04 DIAGNOSIS — Z85048 Personal history of other malignant neoplasm of rectum, rectosigmoid junction, and anus: Secondary | ICD-10-CM | POA: Diagnosis not present

## 2022-05-04 DIAGNOSIS — J440 Chronic obstructive pulmonary disease with acute lower respiratory infection: Secondary | ICD-10-CM | POA: Diagnosis not present

## 2022-05-04 DIAGNOSIS — Z8619 Personal history of other infectious and parasitic diseases: Secondary | ICD-10-CM | POA: Diagnosis not present

## 2022-05-04 DIAGNOSIS — S22000D Wedge compression fracture of unspecified thoracic vertebra, subsequent encounter for fracture with routine healing: Secondary | ICD-10-CM | POA: Diagnosis not present

## 2022-05-04 DIAGNOSIS — J189 Pneumonia, unspecified organism: Secondary | ICD-10-CM | POA: Diagnosis not present

## 2022-05-04 DIAGNOSIS — K5909 Other constipation: Secondary | ICD-10-CM | POA: Diagnosis not present

## 2022-05-04 DIAGNOSIS — J9611 Chronic respiratory failure with hypoxia: Secondary | ICD-10-CM | POA: Diagnosis not present

## 2022-05-05 DIAGNOSIS — K219 Gastro-esophageal reflux disease without esophagitis: Secondary | ICD-10-CM | POA: Diagnosis not present

## 2022-05-05 DIAGNOSIS — S22000D Wedge compression fracture of unspecified thoracic vertebra, subsequent encounter for fracture with routine healing: Secondary | ICD-10-CM | POA: Diagnosis not present

## 2022-05-05 DIAGNOSIS — K5909 Other constipation: Secondary | ICD-10-CM | POA: Diagnosis not present

## 2022-05-05 DIAGNOSIS — J440 Chronic obstructive pulmonary disease with acute lower respiratory infection: Secondary | ICD-10-CM | POA: Diagnosis not present

## 2022-05-05 DIAGNOSIS — Z720 Tobacco use: Secondary | ICD-10-CM | POA: Diagnosis not present

## 2022-05-05 DIAGNOSIS — G894 Chronic pain syndrome: Secondary | ICD-10-CM | POA: Diagnosis not present

## 2022-05-05 DIAGNOSIS — I480 Paroxysmal atrial fibrillation: Secondary | ICD-10-CM | POA: Diagnosis not present

## 2022-05-05 DIAGNOSIS — J9611 Chronic respiratory failure with hypoxia: Secondary | ICD-10-CM | POA: Diagnosis not present

## 2022-05-05 DIAGNOSIS — J189 Pneumonia, unspecified organism: Secondary | ICD-10-CM | POA: Diagnosis not present

## 2022-05-05 DIAGNOSIS — R911 Solitary pulmonary nodule: Secondary | ICD-10-CM | POA: Diagnosis not present

## 2022-05-05 DIAGNOSIS — J441 Chronic obstructive pulmonary disease with (acute) exacerbation: Secondary | ICD-10-CM | POA: Diagnosis not present

## 2022-05-05 DIAGNOSIS — Z85048 Personal history of other malignant neoplasm of rectum, rectosigmoid junction, and anus: Secondary | ICD-10-CM | POA: Diagnosis not present

## 2022-05-05 DIAGNOSIS — Z8619 Personal history of other infectious and parasitic diseases: Secondary | ICD-10-CM | POA: Diagnosis not present

## 2022-05-06 DIAGNOSIS — Z720 Tobacco use: Secondary | ICD-10-CM | POA: Diagnosis not present

## 2022-05-06 DIAGNOSIS — J440 Chronic obstructive pulmonary disease with acute lower respiratory infection: Secondary | ICD-10-CM | POA: Diagnosis not present

## 2022-05-06 DIAGNOSIS — Z8619 Personal history of other infectious and parasitic diseases: Secondary | ICD-10-CM | POA: Diagnosis not present

## 2022-05-06 DIAGNOSIS — S22000D Wedge compression fracture of unspecified thoracic vertebra, subsequent encounter for fracture with routine healing: Secondary | ICD-10-CM | POA: Diagnosis not present

## 2022-05-06 DIAGNOSIS — K5909 Other constipation: Secondary | ICD-10-CM | POA: Diagnosis not present

## 2022-05-06 DIAGNOSIS — J441 Chronic obstructive pulmonary disease with (acute) exacerbation: Secondary | ICD-10-CM | POA: Diagnosis not present

## 2022-05-06 DIAGNOSIS — G894 Chronic pain syndrome: Secondary | ICD-10-CM | POA: Diagnosis not present

## 2022-05-06 DIAGNOSIS — K219 Gastro-esophageal reflux disease without esophagitis: Secondary | ICD-10-CM | POA: Diagnosis not present

## 2022-05-06 DIAGNOSIS — R911 Solitary pulmonary nodule: Secondary | ICD-10-CM | POA: Diagnosis not present

## 2022-05-06 DIAGNOSIS — I480 Paroxysmal atrial fibrillation: Secondary | ICD-10-CM | POA: Diagnosis not present

## 2022-05-06 DIAGNOSIS — J9611 Chronic respiratory failure with hypoxia: Secondary | ICD-10-CM | POA: Diagnosis not present

## 2022-05-06 DIAGNOSIS — J189 Pneumonia, unspecified organism: Secondary | ICD-10-CM | POA: Diagnosis not present

## 2022-05-06 DIAGNOSIS — Z85048 Personal history of other malignant neoplasm of rectum, rectosigmoid junction, and anus: Secondary | ICD-10-CM | POA: Diagnosis not present

## 2022-05-07 DIAGNOSIS — J440 Chronic obstructive pulmonary disease with acute lower respiratory infection: Secondary | ICD-10-CM | POA: Diagnosis not present

## 2022-05-07 DIAGNOSIS — S22000D Wedge compression fracture of unspecified thoracic vertebra, subsequent encounter for fracture with routine healing: Secondary | ICD-10-CM | POA: Diagnosis not present

## 2022-05-07 DIAGNOSIS — Z8619 Personal history of other infectious and parasitic diseases: Secondary | ICD-10-CM | POA: Diagnosis not present

## 2022-05-07 DIAGNOSIS — Z720 Tobacco use: Secondary | ICD-10-CM | POA: Diagnosis not present

## 2022-05-07 DIAGNOSIS — Z85048 Personal history of other malignant neoplasm of rectum, rectosigmoid junction, and anus: Secondary | ICD-10-CM | POA: Diagnosis not present

## 2022-05-07 DIAGNOSIS — K5909 Other constipation: Secondary | ICD-10-CM | POA: Diagnosis not present

## 2022-05-07 DIAGNOSIS — R911 Solitary pulmonary nodule: Secondary | ICD-10-CM | POA: Diagnosis not present

## 2022-05-07 DIAGNOSIS — K219 Gastro-esophageal reflux disease without esophagitis: Secondary | ICD-10-CM | POA: Diagnosis not present

## 2022-05-07 DIAGNOSIS — G894 Chronic pain syndrome: Secondary | ICD-10-CM | POA: Diagnosis not present

## 2022-05-07 DIAGNOSIS — J441 Chronic obstructive pulmonary disease with (acute) exacerbation: Secondary | ICD-10-CM | POA: Diagnosis not present

## 2022-05-07 DIAGNOSIS — I480 Paroxysmal atrial fibrillation: Secondary | ICD-10-CM | POA: Diagnosis not present

## 2022-05-07 DIAGNOSIS — J9611 Chronic respiratory failure with hypoxia: Secondary | ICD-10-CM | POA: Diagnosis not present

## 2022-05-07 DIAGNOSIS — J189 Pneumonia, unspecified organism: Secondary | ICD-10-CM | POA: Diagnosis not present

## 2022-05-08 DIAGNOSIS — Z720 Tobacco use: Secondary | ICD-10-CM | POA: Diagnosis not present

## 2022-05-08 DIAGNOSIS — K219 Gastro-esophageal reflux disease without esophagitis: Secondary | ICD-10-CM | POA: Diagnosis not present

## 2022-05-08 DIAGNOSIS — J441 Chronic obstructive pulmonary disease with (acute) exacerbation: Secondary | ICD-10-CM | POA: Diagnosis not present

## 2022-05-08 DIAGNOSIS — I480 Paroxysmal atrial fibrillation: Secondary | ICD-10-CM | POA: Diagnosis not present

## 2022-05-08 DIAGNOSIS — Z8619 Personal history of other infectious and parasitic diseases: Secondary | ICD-10-CM | POA: Diagnosis not present

## 2022-05-08 DIAGNOSIS — J9611 Chronic respiratory failure with hypoxia: Secondary | ICD-10-CM | POA: Diagnosis not present

## 2022-05-08 DIAGNOSIS — J189 Pneumonia, unspecified organism: Secondary | ICD-10-CM | POA: Diagnosis not present

## 2022-05-08 DIAGNOSIS — J440 Chronic obstructive pulmonary disease with acute lower respiratory infection: Secondary | ICD-10-CM | POA: Diagnosis not present

## 2022-05-08 DIAGNOSIS — R911 Solitary pulmonary nodule: Secondary | ICD-10-CM | POA: Diagnosis not present

## 2022-05-08 DIAGNOSIS — S22000D Wedge compression fracture of unspecified thoracic vertebra, subsequent encounter for fracture with routine healing: Secondary | ICD-10-CM | POA: Diagnosis not present

## 2022-05-08 DIAGNOSIS — Z85048 Personal history of other malignant neoplasm of rectum, rectosigmoid junction, and anus: Secondary | ICD-10-CM | POA: Diagnosis not present

## 2022-05-08 DIAGNOSIS — G894 Chronic pain syndrome: Secondary | ICD-10-CM | POA: Diagnosis not present

## 2022-05-08 DIAGNOSIS — K5909 Other constipation: Secondary | ICD-10-CM | POA: Diagnosis not present

## 2022-05-09 DIAGNOSIS — R911 Solitary pulmonary nodule: Secondary | ICD-10-CM | POA: Diagnosis not present

## 2022-05-09 DIAGNOSIS — I480 Paroxysmal atrial fibrillation: Secondary | ICD-10-CM | POA: Diagnosis not present

## 2022-05-09 DIAGNOSIS — J440 Chronic obstructive pulmonary disease with acute lower respiratory infection: Secondary | ICD-10-CM | POA: Diagnosis not present

## 2022-05-09 DIAGNOSIS — G894 Chronic pain syndrome: Secondary | ICD-10-CM | POA: Diagnosis not present

## 2022-05-09 DIAGNOSIS — Z8619 Personal history of other infectious and parasitic diseases: Secondary | ICD-10-CM | POA: Diagnosis not present

## 2022-05-09 DIAGNOSIS — J441 Chronic obstructive pulmonary disease with (acute) exacerbation: Secondary | ICD-10-CM | POA: Diagnosis not present

## 2022-05-09 DIAGNOSIS — Z720 Tobacco use: Secondary | ICD-10-CM | POA: Diagnosis not present

## 2022-05-09 DIAGNOSIS — S22000D Wedge compression fracture of unspecified thoracic vertebra, subsequent encounter for fracture with routine healing: Secondary | ICD-10-CM | POA: Diagnosis not present

## 2022-05-09 DIAGNOSIS — J189 Pneumonia, unspecified organism: Secondary | ICD-10-CM | POA: Diagnosis not present

## 2022-05-09 DIAGNOSIS — K219 Gastro-esophageal reflux disease without esophagitis: Secondary | ICD-10-CM | POA: Diagnosis not present

## 2022-05-09 DIAGNOSIS — Z85048 Personal history of other malignant neoplasm of rectum, rectosigmoid junction, and anus: Secondary | ICD-10-CM | POA: Diagnosis not present

## 2022-05-09 DIAGNOSIS — K5909 Other constipation: Secondary | ICD-10-CM | POA: Diagnosis not present

## 2022-05-09 DIAGNOSIS — J9611 Chronic respiratory failure with hypoxia: Secondary | ICD-10-CM | POA: Diagnosis not present

## 2022-05-10 DIAGNOSIS — J9611 Chronic respiratory failure with hypoxia: Secondary | ICD-10-CM | POA: Diagnosis not present

## 2022-05-10 DIAGNOSIS — K219 Gastro-esophageal reflux disease without esophagitis: Secondary | ICD-10-CM | POA: Diagnosis not present

## 2022-05-10 DIAGNOSIS — Z720 Tobacco use: Secondary | ICD-10-CM | POA: Diagnosis not present

## 2022-05-10 DIAGNOSIS — J441 Chronic obstructive pulmonary disease with (acute) exacerbation: Secondary | ICD-10-CM | POA: Diagnosis not present

## 2022-05-10 DIAGNOSIS — Z85048 Personal history of other malignant neoplasm of rectum, rectosigmoid junction, and anus: Secondary | ICD-10-CM | POA: Diagnosis not present

## 2022-05-10 DIAGNOSIS — R911 Solitary pulmonary nodule: Secondary | ICD-10-CM | POA: Diagnosis not present

## 2022-05-10 DIAGNOSIS — J440 Chronic obstructive pulmonary disease with acute lower respiratory infection: Secondary | ICD-10-CM | POA: Diagnosis not present

## 2022-05-10 DIAGNOSIS — I480 Paroxysmal atrial fibrillation: Secondary | ICD-10-CM | POA: Diagnosis not present

## 2022-05-10 DIAGNOSIS — J189 Pneumonia, unspecified organism: Secondary | ICD-10-CM | POA: Diagnosis not present

## 2022-05-10 DIAGNOSIS — Z8619 Personal history of other infectious and parasitic diseases: Secondary | ICD-10-CM | POA: Diagnosis not present

## 2022-05-10 DIAGNOSIS — G894 Chronic pain syndrome: Secondary | ICD-10-CM | POA: Diagnosis not present

## 2022-05-10 DIAGNOSIS — S22000D Wedge compression fracture of unspecified thoracic vertebra, subsequent encounter for fracture with routine healing: Secondary | ICD-10-CM | POA: Diagnosis not present

## 2022-05-10 DIAGNOSIS — K5909 Other constipation: Secondary | ICD-10-CM | POA: Diagnosis not present

## 2022-05-11 DIAGNOSIS — Z8619 Personal history of other infectious and parasitic diseases: Secondary | ICD-10-CM | POA: Diagnosis not present

## 2022-05-11 DIAGNOSIS — J9611 Chronic respiratory failure with hypoxia: Secondary | ICD-10-CM | POA: Diagnosis not present

## 2022-05-11 DIAGNOSIS — S22000D Wedge compression fracture of unspecified thoracic vertebra, subsequent encounter for fracture with routine healing: Secondary | ICD-10-CM | POA: Diagnosis not present

## 2022-05-11 DIAGNOSIS — R911 Solitary pulmonary nodule: Secondary | ICD-10-CM | POA: Diagnosis not present

## 2022-05-11 DIAGNOSIS — K219 Gastro-esophageal reflux disease without esophagitis: Secondary | ICD-10-CM | POA: Diagnosis not present

## 2022-05-11 DIAGNOSIS — G894 Chronic pain syndrome: Secondary | ICD-10-CM | POA: Diagnosis not present

## 2022-05-11 DIAGNOSIS — J440 Chronic obstructive pulmonary disease with acute lower respiratory infection: Secondary | ICD-10-CM | POA: Diagnosis not present

## 2022-05-11 DIAGNOSIS — Z85048 Personal history of other malignant neoplasm of rectum, rectosigmoid junction, and anus: Secondary | ICD-10-CM | POA: Diagnosis not present

## 2022-05-11 DIAGNOSIS — J441 Chronic obstructive pulmonary disease with (acute) exacerbation: Secondary | ICD-10-CM | POA: Diagnosis not present

## 2022-05-11 DIAGNOSIS — I480 Paroxysmal atrial fibrillation: Secondary | ICD-10-CM | POA: Diagnosis not present

## 2022-05-11 DIAGNOSIS — J189 Pneumonia, unspecified organism: Secondary | ICD-10-CM | POA: Diagnosis not present

## 2022-05-11 DIAGNOSIS — Z720 Tobacco use: Secondary | ICD-10-CM | POA: Diagnosis not present

## 2022-05-11 DIAGNOSIS — K5909 Other constipation: Secondary | ICD-10-CM | POA: Diagnosis not present

## 2022-05-12 DIAGNOSIS — J440 Chronic obstructive pulmonary disease with acute lower respiratory infection: Secondary | ICD-10-CM | POA: Diagnosis not present

## 2022-05-12 DIAGNOSIS — Z720 Tobacco use: Secondary | ICD-10-CM | POA: Diagnosis not present

## 2022-05-12 DIAGNOSIS — G894 Chronic pain syndrome: Secondary | ICD-10-CM | POA: Diagnosis not present

## 2022-05-12 DIAGNOSIS — I480 Paroxysmal atrial fibrillation: Secondary | ICD-10-CM | POA: Diagnosis not present

## 2022-05-12 DIAGNOSIS — R911 Solitary pulmonary nodule: Secondary | ICD-10-CM | POA: Diagnosis not present

## 2022-05-12 DIAGNOSIS — K5909 Other constipation: Secondary | ICD-10-CM | POA: Diagnosis not present

## 2022-05-12 DIAGNOSIS — J189 Pneumonia, unspecified organism: Secondary | ICD-10-CM | POA: Diagnosis not present

## 2022-05-12 DIAGNOSIS — J441 Chronic obstructive pulmonary disease with (acute) exacerbation: Secondary | ICD-10-CM | POA: Diagnosis not present

## 2022-05-12 DIAGNOSIS — Z8619 Personal history of other infectious and parasitic diseases: Secondary | ICD-10-CM | POA: Diagnosis not present

## 2022-05-12 DIAGNOSIS — K219 Gastro-esophageal reflux disease without esophagitis: Secondary | ICD-10-CM | POA: Diagnosis not present

## 2022-05-12 DIAGNOSIS — J9611 Chronic respiratory failure with hypoxia: Secondary | ICD-10-CM | POA: Diagnosis not present

## 2022-05-12 DIAGNOSIS — Z85048 Personal history of other malignant neoplasm of rectum, rectosigmoid junction, and anus: Secondary | ICD-10-CM | POA: Diagnosis not present

## 2022-05-12 DIAGNOSIS — S22000D Wedge compression fracture of unspecified thoracic vertebra, subsequent encounter for fracture with routine healing: Secondary | ICD-10-CM | POA: Diagnosis not present

## 2022-05-13 DIAGNOSIS — Z8619 Personal history of other infectious and parasitic diseases: Secondary | ICD-10-CM | POA: Diagnosis not present

## 2022-05-13 DIAGNOSIS — J441 Chronic obstructive pulmonary disease with (acute) exacerbation: Secondary | ICD-10-CM | POA: Diagnosis not present

## 2022-05-13 DIAGNOSIS — I480 Paroxysmal atrial fibrillation: Secondary | ICD-10-CM | POA: Diagnosis not present

## 2022-05-13 DIAGNOSIS — R911 Solitary pulmonary nodule: Secondary | ICD-10-CM | POA: Diagnosis not present

## 2022-05-13 DIAGNOSIS — Z85048 Personal history of other malignant neoplasm of rectum, rectosigmoid junction, and anus: Secondary | ICD-10-CM | POA: Diagnosis not present

## 2022-05-13 DIAGNOSIS — Z720 Tobacco use: Secondary | ICD-10-CM | POA: Diagnosis not present

## 2022-05-13 DIAGNOSIS — K219 Gastro-esophageal reflux disease without esophagitis: Secondary | ICD-10-CM | POA: Diagnosis not present

## 2022-05-13 DIAGNOSIS — K5909 Other constipation: Secondary | ICD-10-CM | POA: Diagnosis not present

## 2022-05-13 DIAGNOSIS — G894 Chronic pain syndrome: Secondary | ICD-10-CM | POA: Diagnosis not present

## 2022-05-13 DIAGNOSIS — J189 Pneumonia, unspecified organism: Secondary | ICD-10-CM | POA: Diagnosis not present

## 2022-05-13 DIAGNOSIS — J440 Chronic obstructive pulmonary disease with acute lower respiratory infection: Secondary | ICD-10-CM | POA: Diagnosis not present

## 2022-05-13 DIAGNOSIS — J9611 Chronic respiratory failure with hypoxia: Secondary | ICD-10-CM | POA: Diagnosis not present

## 2022-05-13 DIAGNOSIS — S22000D Wedge compression fracture of unspecified thoracic vertebra, subsequent encounter for fracture with routine healing: Secondary | ICD-10-CM | POA: Diagnosis not present

## 2022-05-14 DIAGNOSIS — J9611 Chronic respiratory failure with hypoxia: Secondary | ICD-10-CM | POA: Diagnosis not present

## 2022-05-14 DIAGNOSIS — Z8619 Personal history of other infectious and parasitic diseases: Secondary | ICD-10-CM | POA: Diagnosis not present

## 2022-05-14 DIAGNOSIS — I480 Paroxysmal atrial fibrillation: Secondary | ICD-10-CM | POA: Diagnosis not present

## 2022-05-14 DIAGNOSIS — G894 Chronic pain syndrome: Secondary | ICD-10-CM | POA: Diagnosis not present

## 2022-05-14 DIAGNOSIS — J440 Chronic obstructive pulmonary disease with acute lower respiratory infection: Secondary | ICD-10-CM | POA: Diagnosis not present

## 2022-05-14 DIAGNOSIS — J441 Chronic obstructive pulmonary disease with (acute) exacerbation: Secondary | ICD-10-CM | POA: Diagnosis not present

## 2022-05-14 DIAGNOSIS — S22000D Wedge compression fracture of unspecified thoracic vertebra, subsequent encounter for fracture with routine healing: Secondary | ICD-10-CM | POA: Diagnosis not present

## 2022-05-14 DIAGNOSIS — K5909 Other constipation: Secondary | ICD-10-CM | POA: Diagnosis not present

## 2022-05-14 DIAGNOSIS — Z85048 Personal history of other malignant neoplasm of rectum, rectosigmoid junction, and anus: Secondary | ICD-10-CM | POA: Diagnosis not present

## 2022-05-14 DIAGNOSIS — R911 Solitary pulmonary nodule: Secondary | ICD-10-CM | POA: Diagnosis not present

## 2022-05-14 DIAGNOSIS — Z720 Tobacco use: Secondary | ICD-10-CM | POA: Diagnosis not present

## 2022-05-14 DIAGNOSIS — J189 Pneumonia, unspecified organism: Secondary | ICD-10-CM | POA: Diagnosis not present

## 2022-05-14 DIAGNOSIS — K219 Gastro-esophageal reflux disease without esophagitis: Secondary | ICD-10-CM | POA: Diagnosis not present

## 2022-05-15 DIAGNOSIS — J440 Chronic obstructive pulmonary disease with acute lower respiratory infection: Secondary | ICD-10-CM | POA: Diagnosis not present

## 2022-05-15 DIAGNOSIS — J189 Pneumonia, unspecified organism: Secondary | ICD-10-CM | POA: Diagnosis not present

## 2022-05-15 DIAGNOSIS — K219 Gastro-esophageal reflux disease without esophagitis: Secondary | ICD-10-CM | POA: Diagnosis not present

## 2022-05-15 DIAGNOSIS — J441 Chronic obstructive pulmonary disease with (acute) exacerbation: Secondary | ICD-10-CM | POA: Diagnosis not present

## 2022-05-15 DIAGNOSIS — G894 Chronic pain syndrome: Secondary | ICD-10-CM | POA: Diagnosis not present

## 2022-05-15 DIAGNOSIS — S22000D Wedge compression fracture of unspecified thoracic vertebra, subsequent encounter for fracture with routine healing: Secondary | ICD-10-CM | POA: Diagnosis not present

## 2022-05-15 DIAGNOSIS — J9611 Chronic respiratory failure with hypoxia: Secondary | ICD-10-CM | POA: Diagnosis not present

## 2022-05-15 DIAGNOSIS — I480 Paroxysmal atrial fibrillation: Secondary | ICD-10-CM | POA: Diagnosis not present

## 2022-05-15 DIAGNOSIS — Z720 Tobacco use: Secondary | ICD-10-CM | POA: Diagnosis not present

## 2022-05-15 DIAGNOSIS — R911 Solitary pulmonary nodule: Secondary | ICD-10-CM | POA: Diagnosis not present

## 2022-05-15 DIAGNOSIS — K5909 Other constipation: Secondary | ICD-10-CM | POA: Diagnosis not present

## 2022-05-15 DIAGNOSIS — Z8619 Personal history of other infectious and parasitic diseases: Secondary | ICD-10-CM | POA: Diagnosis not present

## 2022-05-15 DIAGNOSIS — Z419 Encounter for procedure for purposes other than remedying health state, unspecified: Secondary | ICD-10-CM | POA: Diagnosis not present

## 2022-05-15 DIAGNOSIS — Z85048 Personal history of other malignant neoplasm of rectum, rectosigmoid junction, and anus: Secondary | ICD-10-CM | POA: Diagnosis not present

## 2022-05-16 DIAGNOSIS — I480 Paroxysmal atrial fibrillation: Secondary | ICD-10-CM | POA: Diagnosis not present

## 2022-05-16 DIAGNOSIS — K219 Gastro-esophageal reflux disease without esophagitis: Secondary | ICD-10-CM | POA: Diagnosis not present

## 2022-05-16 DIAGNOSIS — J440 Chronic obstructive pulmonary disease with acute lower respiratory infection: Secondary | ICD-10-CM | POA: Diagnosis not present

## 2022-05-16 DIAGNOSIS — J9611 Chronic respiratory failure with hypoxia: Secondary | ICD-10-CM | POA: Diagnosis not present

## 2022-05-16 DIAGNOSIS — Z85048 Personal history of other malignant neoplasm of rectum, rectosigmoid junction, and anus: Secondary | ICD-10-CM | POA: Diagnosis not present

## 2022-05-16 DIAGNOSIS — G894 Chronic pain syndrome: Secondary | ICD-10-CM | POA: Diagnosis not present

## 2022-05-16 DIAGNOSIS — Z720 Tobacco use: Secondary | ICD-10-CM | POA: Diagnosis not present

## 2022-05-16 DIAGNOSIS — J189 Pneumonia, unspecified organism: Secondary | ICD-10-CM | POA: Diagnosis not present

## 2022-05-16 DIAGNOSIS — J441 Chronic obstructive pulmonary disease with (acute) exacerbation: Secondary | ICD-10-CM | POA: Diagnosis not present

## 2022-05-16 DIAGNOSIS — Z8619 Personal history of other infectious and parasitic diseases: Secondary | ICD-10-CM | POA: Diagnosis not present

## 2022-05-16 DIAGNOSIS — S22000D Wedge compression fracture of unspecified thoracic vertebra, subsequent encounter for fracture with routine healing: Secondary | ICD-10-CM | POA: Diagnosis not present

## 2022-05-16 DIAGNOSIS — K5909 Other constipation: Secondary | ICD-10-CM | POA: Diagnosis not present

## 2022-05-16 DIAGNOSIS — R911 Solitary pulmonary nodule: Secondary | ICD-10-CM | POA: Diagnosis not present

## 2022-05-17 ENCOUNTER — Encounter: Payer: Self-pay | Admitting: Physician Assistant

## 2022-05-17 DIAGNOSIS — S22000D Wedge compression fracture of unspecified thoracic vertebra, subsequent encounter for fracture with routine healing: Secondary | ICD-10-CM | POA: Diagnosis not present

## 2022-05-17 DIAGNOSIS — J441 Chronic obstructive pulmonary disease with (acute) exacerbation: Secondary | ICD-10-CM | POA: Diagnosis not present

## 2022-05-17 DIAGNOSIS — K5909 Other constipation: Secondary | ICD-10-CM | POA: Diagnosis not present

## 2022-05-17 DIAGNOSIS — R911 Solitary pulmonary nodule: Secondary | ICD-10-CM | POA: Diagnosis not present

## 2022-05-17 DIAGNOSIS — K219 Gastro-esophageal reflux disease without esophagitis: Secondary | ICD-10-CM | POA: Diagnosis not present

## 2022-05-17 DIAGNOSIS — G894 Chronic pain syndrome: Secondary | ICD-10-CM | POA: Diagnosis not present

## 2022-05-17 DIAGNOSIS — Z8619 Personal history of other infectious and parasitic diseases: Secondary | ICD-10-CM | POA: Diagnosis not present

## 2022-05-17 DIAGNOSIS — Z85048 Personal history of other malignant neoplasm of rectum, rectosigmoid junction, and anus: Secondary | ICD-10-CM | POA: Diagnosis not present

## 2022-05-17 DIAGNOSIS — Z720 Tobacco use: Secondary | ICD-10-CM | POA: Diagnosis not present

## 2022-05-17 DIAGNOSIS — J9611 Chronic respiratory failure with hypoxia: Secondary | ICD-10-CM | POA: Diagnosis not present

## 2022-05-17 DIAGNOSIS — J440 Chronic obstructive pulmonary disease with acute lower respiratory infection: Secondary | ICD-10-CM | POA: Diagnosis not present

## 2022-05-17 DIAGNOSIS — I480 Paroxysmal atrial fibrillation: Secondary | ICD-10-CM | POA: Diagnosis not present

## 2022-05-17 DIAGNOSIS — J189 Pneumonia, unspecified organism: Secondary | ICD-10-CM | POA: Diagnosis not present

## 2022-05-18 DIAGNOSIS — Z8619 Personal history of other infectious and parasitic diseases: Secondary | ICD-10-CM | POA: Diagnosis not present

## 2022-05-18 DIAGNOSIS — Z720 Tobacco use: Secondary | ICD-10-CM | POA: Diagnosis not present

## 2022-05-18 DIAGNOSIS — S22000D Wedge compression fracture of unspecified thoracic vertebra, subsequent encounter for fracture with routine healing: Secondary | ICD-10-CM | POA: Diagnosis not present

## 2022-05-18 DIAGNOSIS — I480 Paroxysmal atrial fibrillation: Secondary | ICD-10-CM | POA: Diagnosis not present

## 2022-05-18 DIAGNOSIS — J440 Chronic obstructive pulmonary disease with acute lower respiratory infection: Secondary | ICD-10-CM | POA: Diagnosis not present

## 2022-05-18 DIAGNOSIS — J9611 Chronic respiratory failure with hypoxia: Secondary | ICD-10-CM | POA: Diagnosis not present

## 2022-05-18 DIAGNOSIS — J441 Chronic obstructive pulmonary disease with (acute) exacerbation: Secondary | ICD-10-CM | POA: Diagnosis not present

## 2022-05-18 DIAGNOSIS — R911 Solitary pulmonary nodule: Secondary | ICD-10-CM | POA: Diagnosis not present

## 2022-05-18 DIAGNOSIS — J189 Pneumonia, unspecified organism: Secondary | ICD-10-CM | POA: Diagnosis not present

## 2022-05-18 DIAGNOSIS — Z85048 Personal history of other malignant neoplasm of rectum, rectosigmoid junction, and anus: Secondary | ICD-10-CM | POA: Diagnosis not present

## 2022-05-18 DIAGNOSIS — K5909 Other constipation: Secondary | ICD-10-CM | POA: Diagnosis not present

## 2022-05-18 DIAGNOSIS — G894 Chronic pain syndrome: Secondary | ICD-10-CM | POA: Diagnosis not present

## 2022-05-18 DIAGNOSIS — K219 Gastro-esophageal reflux disease without esophagitis: Secondary | ICD-10-CM | POA: Diagnosis not present

## 2022-05-19 DIAGNOSIS — J441 Chronic obstructive pulmonary disease with (acute) exacerbation: Secondary | ICD-10-CM | POA: Diagnosis not present

## 2022-05-19 DIAGNOSIS — Z85048 Personal history of other malignant neoplasm of rectum, rectosigmoid junction, and anus: Secondary | ICD-10-CM | POA: Diagnosis not present

## 2022-05-19 DIAGNOSIS — R911 Solitary pulmonary nodule: Secondary | ICD-10-CM | POA: Diagnosis not present

## 2022-05-19 DIAGNOSIS — K219 Gastro-esophageal reflux disease without esophagitis: Secondary | ICD-10-CM | POA: Diagnosis not present

## 2022-05-19 DIAGNOSIS — Z720 Tobacco use: Secondary | ICD-10-CM | POA: Diagnosis not present

## 2022-05-19 DIAGNOSIS — I480 Paroxysmal atrial fibrillation: Secondary | ICD-10-CM | POA: Diagnosis not present

## 2022-05-19 DIAGNOSIS — K5909 Other constipation: Secondary | ICD-10-CM | POA: Diagnosis not present

## 2022-05-19 DIAGNOSIS — J189 Pneumonia, unspecified organism: Secondary | ICD-10-CM | POA: Diagnosis not present

## 2022-05-19 DIAGNOSIS — J9611 Chronic respiratory failure with hypoxia: Secondary | ICD-10-CM | POA: Diagnosis not present

## 2022-05-19 DIAGNOSIS — Z8619 Personal history of other infectious and parasitic diseases: Secondary | ICD-10-CM | POA: Diagnosis not present

## 2022-05-19 DIAGNOSIS — S22000D Wedge compression fracture of unspecified thoracic vertebra, subsequent encounter for fracture with routine healing: Secondary | ICD-10-CM | POA: Diagnosis not present

## 2022-05-19 DIAGNOSIS — J440 Chronic obstructive pulmonary disease with acute lower respiratory infection: Secondary | ICD-10-CM | POA: Diagnosis not present

## 2022-05-19 DIAGNOSIS — G894 Chronic pain syndrome: Secondary | ICD-10-CM | POA: Diagnosis not present

## 2022-05-20 DIAGNOSIS — J9611 Chronic respiratory failure with hypoxia: Secondary | ICD-10-CM | POA: Diagnosis not present

## 2022-05-20 DIAGNOSIS — J440 Chronic obstructive pulmonary disease with acute lower respiratory infection: Secondary | ICD-10-CM | POA: Diagnosis not present

## 2022-05-20 DIAGNOSIS — G894 Chronic pain syndrome: Secondary | ICD-10-CM | POA: Diagnosis not present

## 2022-05-20 DIAGNOSIS — Z85048 Personal history of other malignant neoplasm of rectum, rectosigmoid junction, and anus: Secondary | ICD-10-CM | POA: Diagnosis not present

## 2022-05-20 DIAGNOSIS — J441 Chronic obstructive pulmonary disease with (acute) exacerbation: Secondary | ICD-10-CM | POA: Diagnosis not present

## 2022-05-20 DIAGNOSIS — R911 Solitary pulmonary nodule: Secondary | ICD-10-CM | POA: Diagnosis not present

## 2022-05-20 DIAGNOSIS — Z8619 Personal history of other infectious and parasitic diseases: Secondary | ICD-10-CM | POA: Diagnosis not present

## 2022-05-20 DIAGNOSIS — Z720 Tobacco use: Secondary | ICD-10-CM | POA: Diagnosis not present

## 2022-05-20 DIAGNOSIS — S22000D Wedge compression fracture of unspecified thoracic vertebra, subsequent encounter for fracture with routine healing: Secondary | ICD-10-CM | POA: Diagnosis not present

## 2022-05-20 DIAGNOSIS — I480 Paroxysmal atrial fibrillation: Secondary | ICD-10-CM | POA: Diagnosis not present

## 2022-05-20 DIAGNOSIS — J189 Pneumonia, unspecified organism: Secondary | ICD-10-CM | POA: Diagnosis not present

## 2022-05-20 DIAGNOSIS — K5909 Other constipation: Secondary | ICD-10-CM | POA: Diagnosis not present

## 2022-05-20 DIAGNOSIS — K219 Gastro-esophageal reflux disease without esophagitis: Secondary | ICD-10-CM | POA: Diagnosis not present

## 2022-05-21 DIAGNOSIS — Z8619 Personal history of other infectious and parasitic diseases: Secondary | ICD-10-CM | POA: Diagnosis not present

## 2022-05-21 DIAGNOSIS — Z85048 Personal history of other malignant neoplasm of rectum, rectosigmoid junction, and anus: Secondary | ICD-10-CM | POA: Diagnosis not present

## 2022-05-21 DIAGNOSIS — J440 Chronic obstructive pulmonary disease with acute lower respiratory infection: Secondary | ICD-10-CM | POA: Diagnosis not present

## 2022-05-21 DIAGNOSIS — Z720 Tobacco use: Secondary | ICD-10-CM | POA: Diagnosis not present

## 2022-05-21 DIAGNOSIS — I480 Paroxysmal atrial fibrillation: Secondary | ICD-10-CM | POA: Diagnosis not present

## 2022-05-21 DIAGNOSIS — J189 Pneumonia, unspecified organism: Secondary | ICD-10-CM | POA: Diagnosis not present

## 2022-05-21 DIAGNOSIS — J441 Chronic obstructive pulmonary disease with (acute) exacerbation: Secondary | ICD-10-CM | POA: Diagnosis not present

## 2022-05-21 DIAGNOSIS — R911 Solitary pulmonary nodule: Secondary | ICD-10-CM | POA: Diagnosis not present

## 2022-05-21 DIAGNOSIS — G894 Chronic pain syndrome: Secondary | ICD-10-CM | POA: Diagnosis not present

## 2022-05-21 DIAGNOSIS — K5909 Other constipation: Secondary | ICD-10-CM | POA: Diagnosis not present

## 2022-05-21 DIAGNOSIS — S22000D Wedge compression fracture of unspecified thoracic vertebra, subsequent encounter for fracture with routine healing: Secondary | ICD-10-CM | POA: Diagnosis not present

## 2022-05-21 DIAGNOSIS — K219 Gastro-esophageal reflux disease without esophagitis: Secondary | ICD-10-CM | POA: Diagnosis not present

## 2022-05-21 DIAGNOSIS — J9611 Chronic respiratory failure with hypoxia: Secondary | ICD-10-CM | POA: Diagnosis not present

## 2022-05-22 DIAGNOSIS — Z85048 Personal history of other malignant neoplasm of rectum, rectosigmoid junction, and anus: Secondary | ICD-10-CM | POA: Diagnosis not present

## 2022-05-22 DIAGNOSIS — J441 Chronic obstructive pulmonary disease with (acute) exacerbation: Secondary | ICD-10-CM | POA: Diagnosis not present

## 2022-05-22 DIAGNOSIS — K5909 Other constipation: Secondary | ICD-10-CM | POA: Diagnosis not present

## 2022-05-22 DIAGNOSIS — G894 Chronic pain syndrome: Secondary | ICD-10-CM | POA: Diagnosis not present

## 2022-05-22 DIAGNOSIS — J440 Chronic obstructive pulmonary disease with acute lower respiratory infection: Secondary | ICD-10-CM | POA: Diagnosis not present

## 2022-05-22 DIAGNOSIS — I480 Paroxysmal atrial fibrillation: Secondary | ICD-10-CM | POA: Diagnosis not present

## 2022-05-22 DIAGNOSIS — S22000D Wedge compression fracture of unspecified thoracic vertebra, subsequent encounter for fracture with routine healing: Secondary | ICD-10-CM | POA: Diagnosis not present

## 2022-05-22 DIAGNOSIS — J9611 Chronic respiratory failure with hypoxia: Secondary | ICD-10-CM | POA: Diagnosis not present

## 2022-05-22 DIAGNOSIS — Z8619 Personal history of other infectious and parasitic diseases: Secondary | ICD-10-CM | POA: Diagnosis not present

## 2022-05-22 DIAGNOSIS — K219 Gastro-esophageal reflux disease without esophagitis: Secondary | ICD-10-CM | POA: Diagnosis not present

## 2022-05-22 DIAGNOSIS — R911 Solitary pulmonary nodule: Secondary | ICD-10-CM | POA: Diagnosis not present

## 2022-05-22 DIAGNOSIS — J189 Pneumonia, unspecified organism: Secondary | ICD-10-CM | POA: Diagnosis not present

## 2022-05-22 DIAGNOSIS — Z720 Tobacco use: Secondary | ICD-10-CM | POA: Diagnosis not present

## 2022-05-23 DIAGNOSIS — K5909 Other constipation: Secondary | ICD-10-CM | POA: Diagnosis not present

## 2022-05-23 DIAGNOSIS — Z8619 Personal history of other infectious and parasitic diseases: Secondary | ICD-10-CM | POA: Diagnosis not present

## 2022-05-23 DIAGNOSIS — Z85048 Personal history of other malignant neoplasm of rectum, rectosigmoid junction, and anus: Secondary | ICD-10-CM | POA: Diagnosis not present

## 2022-05-23 DIAGNOSIS — J440 Chronic obstructive pulmonary disease with acute lower respiratory infection: Secondary | ICD-10-CM | POA: Diagnosis not present

## 2022-05-23 DIAGNOSIS — Z720 Tobacco use: Secondary | ICD-10-CM | POA: Diagnosis not present

## 2022-05-23 DIAGNOSIS — I480 Paroxysmal atrial fibrillation: Secondary | ICD-10-CM | POA: Diagnosis not present

## 2022-05-23 DIAGNOSIS — S22000D Wedge compression fracture of unspecified thoracic vertebra, subsequent encounter for fracture with routine healing: Secondary | ICD-10-CM | POA: Diagnosis not present

## 2022-05-23 DIAGNOSIS — G894 Chronic pain syndrome: Secondary | ICD-10-CM | POA: Diagnosis not present

## 2022-05-23 DIAGNOSIS — J189 Pneumonia, unspecified organism: Secondary | ICD-10-CM | POA: Diagnosis not present

## 2022-05-23 DIAGNOSIS — J441 Chronic obstructive pulmonary disease with (acute) exacerbation: Secondary | ICD-10-CM | POA: Diagnosis not present

## 2022-05-23 DIAGNOSIS — R911 Solitary pulmonary nodule: Secondary | ICD-10-CM | POA: Diagnosis not present

## 2022-05-23 DIAGNOSIS — J9611 Chronic respiratory failure with hypoxia: Secondary | ICD-10-CM | POA: Diagnosis not present

## 2022-05-23 DIAGNOSIS — K219 Gastro-esophageal reflux disease without esophagitis: Secondary | ICD-10-CM | POA: Diagnosis not present

## 2022-05-24 DIAGNOSIS — J440 Chronic obstructive pulmonary disease with acute lower respiratory infection: Secondary | ICD-10-CM | POA: Diagnosis not present

## 2022-05-24 DIAGNOSIS — J9611 Chronic respiratory failure with hypoxia: Secondary | ICD-10-CM | POA: Diagnosis not present

## 2022-05-24 DIAGNOSIS — Z8619 Personal history of other infectious and parasitic diseases: Secondary | ICD-10-CM | POA: Diagnosis not present

## 2022-05-24 DIAGNOSIS — K5909 Other constipation: Secondary | ICD-10-CM | POA: Diagnosis not present

## 2022-05-24 DIAGNOSIS — J441 Chronic obstructive pulmonary disease with (acute) exacerbation: Secondary | ICD-10-CM | POA: Diagnosis not present

## 2022-05-24 DIAGNOSIS — G894 Chronic pain syndrome: Secondary | ICD-10-CM | POA: Diagnosis not present

## 2022-05-24 DIAGNOSIS — Z720 Tobacco use: Secondary | ICD-10-CM | POA: Diagnosis not present

## 2022-05-24 DIAGNOSIS — R911 Solitary pulmonary nodule: Secondary | ICD-10-CM | POA: Diagnosis not present

## 2022-05-24 DIAGNOSIS — Z85048 Personal history of other malignant neoplasm of rectum, rectosigmoid junction, and anus: Secondary | ICD-10-CM | POA: Diagnosis not present

## 2022-05-24 DIAGNOSIS — K219 Gastro-esophageal reflux disease without esophagitis: Secondary | ICD-10-CM | POA: Diagnosis not present

## 2022-05-24 DIAGNOSIS — S22000D Wedge compression fracture of unspecified thoracic vertebra, subsequent encounter for fracture with routine healing: Secondary | ICD-10-CM | POA: Diagnosis not present

## 2022-05-24 DIAGNOSIS — J189 Pneumonia, unspecified organism: Secondary | ICD-10-CM | POA: Diagnosis not present

## 2022-05-24 DIAGNOSIS — I480 Paroxysmal atrial fibrillation: Secondary | ICD-10-CM | POA: Diagnosis not present

## 2022-05-25 DIAGNOSIS — Z8619 Personal history of other infectious and parasitic diseases: Secondary | ICD-10-CM | POA: Diagnosis not present

## 2022-05-25 DIAGNOSIS — J441 Chronic obstructive pulmonary disease with (acute) exacerbation: Secondary | ICD-10-CM | POA: Diagnosis not present

## 2022-05-25 DIAGNOSIS — J9611 Chronic respiratory failure with hypoxia: Secondary | ICD-10-CM | POA: Diagnosis not present

## 2022-05-25 DIAGNOSIS — Z85048 Personal history of other malignant neoplasm of rectum, rectosigmoid junction, and anus: Secondary | ICD-10-CM | POA: Diagnosis not present

## 2022-05-25 DIAGNOSIS — G894 Chronic pain syndrome: Secondary | ICD-10-CM | POA: Diagnosis not present

## 2022-05-25 DIAGNOSIS — J189 Pneumonia, unspecified organism: Secondary | ICD-10-CM | POA: Diagnosis not present

## 2022-05-25 DIAGNOSIS — J440 Chronic obstructive pulmonary disease with acute lower respiratory infection: Secondary | ICD-10-CM | POA: Diagnosis not present

## 2022-05-25 DIAGNOSIS — K219 Gastro-esophageal reflux disease without esophagitis: Secondary | ICD-10-CM | POA: Diagnosis not present

## 2022-05-25 DIAGNOSIS — K5909 Other constipation: Secondary | ICD-10-CM | POA: Diagnosis not present

## 2022-05-25 DIAGNOSIS — R911 Solitary pulmonary nodule: Secondary | ICD-10-CM | POA: Diagnosis not present

## 2022-05-25 DIAGNOSIS — S22000D Wedge compression fracture of unspecified thoracic vertebra, subsequent encounter for fracture with routine healing: Secondary | ICD-10-CM | POA: Diagnosis not present

## 2022-05-25 DIAGNOSIS — Z720 Tobacco use: Secondary | ICD-10-CM | POA: Diagnosis not present

## 2022-05-25 DIAGNOSIS — I480 Paroxysmal atrial fibrillation: Secondary | ICD-10-CM | POA: Diagnosis not present

## 2022-05-26 DIAGNOSIS — S22000D Wedge compression fracture of unspecified thoracic vertebra, subsequent encounter for fracture with routine healing: Secondary | ICD-10-CM | POA: Diagnosis not present

## 2022-05-26 DIAGNOSIS — J441 Chronic obstructive pulmonary disease with (acute) exacerbation: Secondary | ICD-10-CM | POA: Diagnosis not present

## 2022-05-26 DIAGNOSIS — K219 Gastro-esophageal reflux disease without esophagitis: Secondary | ICD-10-CM | POA: Diagnosis not present

## 2022-05-26 DIAGNOSIS — R911 Solitary pulmonary nodule: Secondary | ICD-10-CM | POA: Diagnosis not present

## 2022-05-26 DIAGNOSIS — I480 Paroxysmal atrial fibrillation: Secondary | ICD-10-CM | POA: Diagnosis not present

## 2022-05-26 DIAGNOSIS — Z720 Tobacco use: Secondary | ICD-10-CM | POA: Diagnosis not present

## 2022-05-26 DIAGNOSIS — Z85048 Personal history of other malignant neoplasm of rectum, rectosigmoid junction, and anus: Secondary | ICD-10-CM | POA: Diagnosis not present

## 2022-05-26 DIAGNOSIS — J440 Chronic obstructive pulmonary disease with acute lower respiratory infection: Secondary | ICD-10-CM | POA: Diagnosis not present

## 2022-05-26 DIAGNOSIS — Z8619 Personal history of other infectious and parasitic diseases: Secondary | ICD-10-CM | POA: Diagnosis not present

## 2022-05-26 DIAGNOSIS — J189 Pneumonia, unspecified organism: Secondary | ICD-10-CM | POA: Diagnosis not present

## 2022-05-26 DIAGNOSIS — J9611 Chronic respiratory failure with hypoxia: Secondary | ICD-10-CM | POA: Diagnosis not present

## 2022-05-26 DIAGNOSIS — G894 Chronic pain syndrome: Secondary | ICD-10-CM | POA: Diagnosis not present

## 2022-05-26 DIAGNOSIS — K5909 Other constipation: Secondary | ICD-10-CM | POA: Diagnosis not present

## 2022-05-27 DIAGNOSIS — K219 Gastro-esophageal reflux disease without esophagitis: Secondary | ICD-10-CM | POA: Diagnosis not present

## 2022-05-27 DIAGNOSIS — K5909 Other constipation: Secondary | ICD-10-CM | POA: Diagnosis not present

## 2022-05-27 DIAGNOSIS — G894 Chronic pain syndrome: Secondary | ICD-10-CM | POA: Diagnosis not present

## 2022-05-27 DIAGNOSIS — J441 Chronic obstructive pulmonary disease with (acute) exacerbation: Secondary | ICD-10-CM | POA: Diagnosis not present

## 2022-05-27 DIAGNOSIS — J189 Pneumonia, unspecified organism: Secondary | ICD-10-CM | POA: Diagnosis not present

## 2022-05-27 DIAGNOSIS — J440 Chronic obstructive pulmonary disease with acute lower respiratory infection: Secondary | ICD-10-CM | POA: Diagnosis not present

## 2022-05-27 DIAGNOSIS — S22000D Wedge compression fracture of unspecified thoracic vertebra, subsequent encounter for fracture with routine healing: Secondary | ICD-10-CM | POA: Diagnosis not present

## 2022-05-27 DIAGNOSIS — Z720 Tobacco use: Secondary | ICD-10-CM | POA: Diagnosis not present

## 2022-05-27 DIAGNOSIS — R911 Solitary pulmonary nodule: Secondary | ICD-10-CM | POA: Diagnosis not present

## 2022-05-27 DIAGNOSIS — J9611 Chronic respiratory failure with hypoxia: Secondary | ICD-10-CM | POA: Diagnosis not present

## 2022-05-27 DIAGNOSIS — Z85048 Personal history of other malignant neoplasm of rectum, rectosigmoid junction, and anus: Secondary | ICD-10-CM | POA: Diagnosis not present

## 2022-05-27 DIAGNOSIS — Z8619 Personal history of other infectious and parasitic diseases: Secondary | ICD-10-CM | POA: Diagnosis not present

## 2022-05-27 DIAGNOSIS — I480 Paroxysmal atrial fibrillation: Secondary | ICD-10-CM | POA: Diagnosis not present

## 2022-05-28 DIAGNOSIS — R911 Solitary pulmonary nodule: Secondary | ICD-10-CM | POA: Diagnosis not present

## 2022-05-28 DIAGNOSIS — K5909 Other constipation: Secondary | ICD-10-CM | POA: Diagnosis not present

## 2022-05-28 DIAGNOSIS — J441 Chronic obstructive pulmonary disease with (acute) exacerbation: Secondary | ICD-10-CM | POA: Diagnosis not present

## 2022-05-28 DIAGNOSIS — G894 Chronic pain syndrome: Secondary | ICD-10-CM | POA: Diagnosis not present

## 2022-05-28 DIAGNOSIS — Z720 Tobacco use: Secondary | ICD-10-CM | POA: Diagnosis not present

## 2022-05-28 DIAGNOSIS — S22000D Wedge compression fracture of unspecified thoracic vertebra, subsequent encounter for fracture with routine healing: Secondary | ICD-10-CM | POA: Diagnosis not present

## 2022-05-28 DIAGNOSIS — Z85048 Personal history of other malignant neoplasm of rectum, rectosigmoid junction, and anus: Secondary | ICD-10-CM | POA: Diagnosis not present

## 2022-05-28 DIAGNOSIS — J9611 Chronic respiratory failure with hypoxia: Secondary | ICD-10-CM | POA: Diagnosis not present

## 2022-05-28 DIAGNOSIS — K219 Gastro-esophageal reflux disease without esophagitis: Secondary | ICD-10-CM | POA: Diagnosis not present

## 2022-05-28 DIAGNOSIS — Z8619 Personal history of other infectious and parasitic diseases: Secondary | ICD-10-CM | POA: Diagnosis not present

## 2022-05-28 DIAGNOSIS — I480 Paroxysmal atrial fibrillation: Secondary | ICD-10-CM | POA: Diagnosis not present

## 2022-05-28 DIAGNOSIS — J440 Chronic obstructive pulmonary disease with acute lower respiratory infection: Secondary | ICD-10-CM | POA: Diagnosis not present

## 2022-05-28 DIAGNOSIS — J189 Pneumonia, unspecified organism: Secondary | ICD-10-CM | POA: Diagnosis not present

## 2022-05-29 DIAGNOSIS — I480 Paroxysmal atrial fibrillation: Secondary | ICD-10-CM | POA: Diagnosis not present

## 2022-05-29 DIAGNOSIS — J441 Chronic obstructive pulmonary disease with (acute) exacerbation: Secondary | ICD-10-CM | POA: Diagnosis not present

## 2022-05-29 DIAGNOSIS — J9611 Chronic respiratory failure with hypoxia: Secondary | ICD-10-CM | POA: Diagnosis not present

## 2022-05-29 DIAGNOSIS — J440 Chronic obstructive pulmonary disease with acute lower respiratory infection: Secondary | ICD-10-CM | POA: Diagnosis not present

## 2022-05-29 DIAGNOSIS — K219 Gastro-esophageal reflux disease without esophagitis: Secondary | ICD-10-CM | POA: Diagnosis not present

## 2022-05-29 DIAGNOSIS — Z720 Tobacco use: Secondary | ICD-10-CM | POA: Diagnosis not present

## 2022-05-29 DIAGNOSIS — K5909 Other constipation: Secondary | ICD-10-CM | POA: Diagnosis not present

## 2022-05-29 DIAGNOSIS — J189 Pneumonia, unspecified organism: Secondary | ICD-10-CM | POA: Diagnosis not present

## 2022-05-29 DIAGNOSIS — Z85048 Personal history of other malignant neoplasm of rectum, rectosigmoid junction, and anus: Secondary | ICD-10-CM | POA: Diagnosis not present

## 2022-05-29 DIAGNOSIS — S22000D Wedge compression fracture of unspecified thoracic vertebra, subsequent encounter for fracture with routine healing: Secondary | ICD-10-CM | POA: Diagnosis not present

## 2022-05-29 DIAGNOSIS — G894 Chronic pain syndrome: Secondary | ICD-10-CM | POA: Diagnosis not present

## 2022-05-29 DIAGNOSIS — J449 Chronic obstructive pulmonary disease, unspecified: Secondary | ICD-10-CM | POA: Diagnosis not present

## 2022-05-29 DIAGNOSIS — Z8619 Personal history of other infectious and parasitic diseases: Secondary | ICD-10-CM | POA: Diagnosis not present

## 2022-05-29 DIAGNOSIS — R911 Solitary pulmonary nodule: Secondary | ICD-10-CM | POA: Diagnosis not present

## 2022-05-30 DIAGNOSIS — I480 Paroxysmal atrial fibrillation: Secondary | ICD-10-CM | POA: Diagnosis not present

## 2022-05-30 DIAGNOSIS — Z85048 Personal history of other malignant neoplasm of rectum, rectosigmoid junction, and anus: Secondary | ICD-10-CM | POA: Diagnosis not present

## 2022-05-30 DIAGNOSIS — R911 Solitary pulmonary nodule: Secondary | ICD-10-CM | POA: Diagnosis not present

## 2022-05-30 DIAGNOSIS — K219 Gastro-esophageal reflux disease without esophagitis: Secondary | ICD-10-CM | POA: Diagnosis not present

## 2022-05-30 DIAGNOSIS — G894 Chronic pain syndrome: Secondary | ICD-10-CM | POA: Diagnosis not present

## 2022-05-30 DIAGNOSIS — J9611 Chronic respiratory failure with hypoxia: Secondary | ICD-10-CM | POA: Diagnosis not present

## 2022-05-30 DIAGNOSIS — K5909 Other constipation: Secondary | ICD-10-CM | POA: Diagnosis not present

## 2022-05-30 DIAGNOSIS — S22000D Wedge compression fracture of unspecified thoracic vertebra, subsequent encounter for fracture with routine healing: Secondary | ICD-10-CM | POA: Diagnosis not present

## 2022-05-30 DIAGNOSIS — J441 Chronic obstructive pulmonary disease with (acute) exacerbation: Secondary | ICD-10-CM | POA: Diagnosis not present

## 2022-05-30 DIAGNOSIS — Z720 Tobacco use: Secondary | ICD-10-CM | POA: Diagnosis not present

## 2022-05-30 DIAGNOSIS — Z8619 Personal history of other infectious and parasitic diseases: Secondary | ICD-10-CM | POA: Diagnosis not present

## 2022-05-30 DIAGNOSIS — J189 Pneumonia, unspecified organism: Secondary | ICD-10-CM | POA: Diagnosis not present

## 2022-05-30 DIAGNOSIS — J440 Chronic obstructive pulmonary disease with acute lower respiratory infection: Secondary | ICD-10-CM | POA: Diagnosis not present

## 2022-05-31 DIAGNOSIS — J440 Chronic obstructive pulmonary disease with acute lower respiratory infection: Secondary | ICD-10-CM | POA: Diagnosis not present

## 2022-05-31 DIAGNOSIS — Z8619 Personal history of other infectious and parasitic diseases: Secondary | ICD-10-CM | POA: Diagnosis not present

## 2022-05-31 DIAGNOSIS — G894 Chronic pain syndrome: Secondary | ICD-10-CM | POA: Diagnosis not present

## 2022-05-31 DIAGNOSIS — K5909 Other constipation: Secondary | ICD-10-CM | POA: Diagnosis not present

## 2022-05-31 DIAGNOSIS — K219 Gastro-esophageal reflux disease without esophagitis: Secondary | ICD-10-CM | POA: Diagnosis not present

## 2022-05-31 DIAGNOSIS — Z85048 Personal history of other malignant neoplasm of rectum, rectosigmoid junction, and anus: Secondary | ICD-10-CM | POA: Diagnosis not present

## 2022-05-31 DIAGNOSIS — I480 Paroxysmal atrial fibrillation: Secondary | ICD-10-CM | POA: Diagnosis not present

## 2022-05-31 DIAGNOSIS — S22000D Wedge compression fracture of unspecified thoracic vertebra, subsequent encounter for fracture with routine healing: Secondary | ICD-10-CM | POA: Diagnosis not present

## 2022-05-31 DIAGNOSIS — J441 Chronic obstructive pulmonary disease with (acute) exacerbation: Secondary | ICD-10-CM | POA: Diagnosis not present

## 2022-05-31 DIAGNOSIS — Z720 Tobacco use: Secondary | ICD-10-CM | POA: Diagnosis not present

## 2022-05-31 DIAGNOSIS — J189 Pneumonia, unspecified organism: Secondary | ICD-10-CM | POA: Diagnosis not present

## 2022-05-31 DIAGNOSIS — R911 Solitary pulmonary nodule: Secondary | ICD-10-CM | POA: Diagnosis not present

## 2022-05-31 DIAGNOSIS — J9611 Chronic respiratory failure with hypoxia: Secondary | ICD-10-CM | POA: Diagnosis not present

## 2022-06-01 DIAGNOSIS — J441 Chronic obstructive pulmonary disease with (acute) exacerbation: Secondary | ICD-10-CM | POA: Diagnosis not present

## 2022-06-01 DIAGNOSIS — Z720 Tobacco use: Secondary | ICD-10-CM | POA: Diagnosis not present

## 2022-06-01 DIAGNOSIS — J9611 Chronic respiratory failure with hypoxia: Secondary | ICD-10-CM | POA: Diagnosis not present

## 2022-06-01 DIAGNOSIS — S22000D Wedge compression fracture of unspecified thoracic vertebra, subsequent encounter for fracture with routine healing: Secondary | ICD-10-CM | POA: Diagnosis not present

## 2022-06-01 DIAGNOSIS — J189 Pneumonia, unspecified organism: Secondary | ICD-10-CM | POA: Diagnosis not present

## 2022-06-01 DIAGNOSIS — R911 Solitary pulmonary nodule: Secondary | ICD-10-CM | POA: Diagnosis not present

## 2022-06-01 DIAGNOSIS — G894 Chronic pain syndrome: Secondary | ICD-10-CM | POA: Diagnosis not present

## 2022-06-01 DIAGNOSIS — Z85048 Personal history of other malignant neoplasm of rectum, rectosigmoid junction, and anus: Secondary | ICD-10-CM | POA: Diagnosis not present

## 2022-06-01 DIAGNOSIS — Z8619 Personal history of other infectious and parasitic diseases: Secondary | ICD-10-CM | POA: Diagnosis not present

## 2022-06-01 DIAGNOSIS — K5909 Other constipation: Secondary | ICD-10-CM | POA: Diagnosis not present

## 2022-06-01 DIAGNOSIS — I480 Paroxysmal atrial fibrillation: Secondary | ICD-10-CM | POA: Diagnosis not present

## 2022-06-01 DIAGNOSIS — K219 Gastro-esophageal reflux disease without esophagitis: Secondary | ICD-10-CM | POA: Diagnosis not present

## 2022-06-01 DIAGNOSIS — J440 Chronic obstructive pulmonary disease with acute lower respiratory infection: Secondary | ICD-10-CM | POA: Diagnosis not present

## 2022-06-02 DIAGNOSIS — Z85048 Personal history of other malignant neoplasm of rectum, rectosigmoid junction, and anus: Secondary | ICD-10-CM | POA: Diagnosis not present

## 2022-06-02 DIAGNOSIS — G894 Chronic pain syndrome: Secondary | ICD-10-CM | POA: Diagnosis not present

## 2022-06-02 DIAGNOSIS — J440 Chronic obstructive pulmonary disease with acute lower respiratory infection: Secondary | ICD-10-CM | POA: Diagnosis not present

## 2022-06-02 DIAGNOSIS — R911 Solitary pulmonary nodule: Secondary | ICD-10-CM | POA: Diagnosis not present

## 2022-06-02 DIAGNOSIS — S22000D Wedge compression fracture of unspecified thoracic vertebra, subsequent encounter for fracture with routine healing: Secondary | ICD-10-CM | POA: Diagnosis not present

## 2022-06-02 DIAGNOSIS — Z8619 Personal history of other infectious and parasitic diseases: Secondary | ICD-10-CM | POA: Diagnosis not present

## 2022-06-02 DIAGNOSIS — K5909 Other constipation: Secondary | ICD-10-CM | POA: Diagnosis not present

## 2022-06-02 DIAGNOSIS — J189 Pneumonia, unspecified organism: Secondary | ICD-10-CM | POA: Diagnosis not present

## 2022-06-02 DIAGNOSIS — J441 Chronic obstructive pulmonary disease with (acute) exacerbation: Secondary | ICD-10-CM | POA: Diagnosis not present

## 2022-06-02 DIAGNOSIS — J9611 Chronic respiratory failure with hypoxia: Secondary | ICD-10-CM | POA: Diagnosis not present

## 2022-06-02 DIAGNOSIS — K219 Gastro-esophageal reflux disease without esophagitis: Secondary | ICD-10-CM | POA: Diagnosis not present

## 2022-06-02 DIAGNOSIS — I480 Paroxysmal atrial fibrillation: Secondary | ICD-10-CM | POA: Diagnosis not present

## 2022-06-02 DIAGNOSIS — Z720 Tobacco use: Secondary | ICD-10-CM | POA: Diagnosis not present

## 2022-06-03 DIAGNOSIS — I480 Paroxysmal atrial fibrillation: Secondary | ICD-10-CM | POA: Diagnosis not present

## 2022-06-03 DIAGNOSIS — Z8619 Personal history of other infectious and parasitic diseases: Secondary | ICD-10-CM | POA: Diagnosis not present

## 2022-06-03 DIAGNOSIS — R911 Solitary pulmonary nodule: Secondary | ICD-10-CM | POA: Diagnosis not present

## 2022-06-03 DIAGNOSIS — J9611 Chronic respiratory failure with hypoxia: Secondary | ICD-10-CM | POA: Diagnosis not present

## 2022-06-03 DIAGNOSIS — J440 Chronic obstructive pulmonary disease with acute lower respiratory infection: Secondary | ICD-10-CM | POA: Diagnosis not present

## 2022-06-03 DIAGNOSIS — Z85048 Personal history of other malignant neoplasm of rectum, rectosigmoid junction, and anus: Secondary | ICD-10-CM | POA: Diagnosis not present

## 2022-06-03 DIAGNOSIS — G894 Chronic pain syndrome: Secondary | ICD-10-CM | POA: Diagnosis not present

## 2022-06-03 DIAGNOSIS — K5909 Other constipation: Secondary | ICD-10-CM | POA: Diagnosis not present

## 2022-06-03 DIAGNOSIS — J441 Chronic obstructive pulmonary disease with (acute) exacerbation: Secondary | ICD-10-CM | POA: Diagnosis not present

## 2022-06-03 DIAGNOSIS — Z720 Tobacco use: Secondary | ICD-10-CM | POA: Diagnosis not present

## 2022-06-03 DIAGNOSIS — J189 Pneumonia, unspecified organism: Secondary | ICD-10-CM | POA: Diagnosis not present

## 2022-06-03 DIAGNOSIS — J449 Chronic obstructive pulmonary disease, unspecified: Secondary | ICD-10-CM | POA: Diagnosis not present

## 2022-06-03 DIAGNOSIS — K219 Gastro-esophageal reflux disease without esophagitis: Secondary | ICD-10-CM | POA: Diagnosis not present

## 2022-06-03 DIAGNOSIS — S22000D Wedge compression fracture of unspecified thoracic vertebra, subsequent encounter for fracture with routine healing: Secondary | ICD-10-CM | POA: Diagnosis not present

## 2022-06-04 DIAGNOSIS — K5909 Other constipation: Secondary | ICD-10-CM | POA: Diagnosis not present

## 2022-06-04 DIAGNOSIS — J441 Chronic obstructive pulmonary disease with (acute) exacerbation: Secondary | ICD-10-CM | POA: Diagnosis not present

## 2022-06-04 DIAGNOSIS — Z720 Tobacco use: Secondary | ICD-10-CM | POA: Diagnosis not present

## 2022-06-04 DIAGNOSIS — I480 Paroxysmal atrial fibrillation: Secondary | ICD-10-CM | POA: Diagnosis not present

## 2022-06-04 DIAGNOSIS — R911 Solitary pulmonary nodule: Secondary | ICD-10-CM | POA: Diagnosis not present

## 2022-06-04 DIAGNOSIS — J9611 Chronic respiratory failure with hypoxia: Secondary | ICD-10-CM | POA: Diagnosis not present

## 2022-06-04 DIAGNOSIS — J440 Chronic obstructive pulmonary disease with acute lower respiratory infection: Secondary | ICD-10-CM | POA: Diagnosis not present

## 2022-06-04 DIAGNOSIS — G894 Chronic pain syndrome: Secondary | ICD-10-CM | POA: Diagnosis not present

## 2022-06-04 DIAGNOSIS — K219 Gastro-esophageal reflux disease without esophagitis: Secondary | ICD-10-CM | POA: Diagnosis not present

## 2022-06-04 DIAGNOSIS — S22000D Wedge compression fracture of unspecified thoracic vertebra, subsequent encounter for fracture with routine healing: Secondary | ICD-10-CM | POA: Diagnosis not present

## 2022-06-04 DIAGNOSIS — Z85048 Personal history of other malignant neoplasm of rectum, rectosigmoid junction, and anus: Secondary | ICD-10-CM | POA: Diagnosis not present

## 2022-06-04 DIAGNOSIS — J189 Pneumonia, unspecified organism: Secondary | ICD-10-CM | POA: Diagnosis not present

## 2022-06-04 DIAGNOSIS — Z8619 Personal history of other infectious and parasitic diseases: Secondary | ICD-10-CM | POA: Diagnosis not present

## 2022-06-05 DIAGNOSIS — G894 Chronic pain syndrome: Secondary | ICD-10-CM | POA: Diagnosis not present

## 2022-06-05 DIAGNOSIS — Z8619 Personal history of other infectious and parasitic diseases: Secondary | ICD-10-CM | POA: Diagnosis not present

## 2022-06-05 DIAGNOSIS — Z85048 Personal history of other malignant neoplasm of rectum, rectosigmoid junction, and anus: Secondary | ICD-10-CM | POA: Diagnosis not present

## 2022-06-05 DIAGNOSIS — J440 Chronic obstructive pulmonary disease with acute lower respiratory infection: Secondary | ICD-10-CM | POA: Diagnosis not present

## 2022-06-05 DIAGNOSIS — I480 Paroxysmal atrial fibrillation: Secondary | ICD-10-CM | POA: Diagnosis not present

## 2022-06-05 DIAGNOSIS — S22000D Wedge compression fracture of unspecified thoracic vertebra, subsequent encounter for fracture with routine healing: Secondary | ICD-10-CM | POA: Diagnosis not present

## 2022-06-05 DIAGNOSIS — Z720 Tobacco use: Secondary | ICD-10-CM | POA: Diagnosis not present

## 2022-06-05 DIAGNOSIS — K219 Gastro-esophageal reflux disease without esophagitis: Secondary | ICD-10-CM | POA: Diagnosis not present

## 2022-06-05 DIAGNOSIS — J441 Chronic obstructive pulmonary disease with (acute) exacerbation: Secondary | ICD-10-CM | POA: Diagnosis not present

## 2022-06-05 DIAGNOSIS — R911 Solitary pulmonary nodule: Secondary | ICD-10-CM | POA: Diagnosis not present

## 2022-06-05 DIAGNOSIS — J9611 Chronic respiratory failure with hypoxia: Secondary | ICD-10-CM | POA: Diagnosis not present

## 2022-06-05 DIAGNOSIS — J189 Pneumonia, unspecified organism: Secondary | ICD-10-CM | POA: Diagnosis not present

## 2022-06-05 DIAGNOSIS — K5909 Other constipation: Secondary | ICD-10-CM | POA: Diagnosis not present

## 2022-06-06 DIAGNOSIS — K219 Gastro-esophageal reflux disease without esophagitis: Secondary | ICD-10-CM | POA: Diagnosis not present

## 2022-06-06 DIAGNOSIS — K5909 Other constipation: Secondary | ICD-10-CM | POA: Diagnosis not present

## 2022-06-06 DIAGNOSIS — J440 Chronic obstructive pulmonary disease with acute lower respiratory infection: Secondary | ICD-10-CM | POA: Diagnosis not present

## 2022-06-06 DIAGNOSIS — S22000D Wedge compression fracture of unspecified thoracic vertebra, subsequent encounter for fracture with routine healing: Secondary | ICD-10-CM | POA: Diagnosis not present

## 2022-06-06 DIAGNOSIS — Z720 Tobacco use: Secondary | ICD-10-CM | POA: Diagnosis not present

## 2022-06-06 DIAGNOSIS — J9611 Chronic respiratory failure with hypoxia: Secondary | ICD-10-CM | POA: Diagnosis not present

## 2022-06-06 DIAGNOSIS — Z8619 Personal history of other infectious and parasitic diseases: Secondary | ICD-10-CM | POA: Diagnosis not present

## 2022-06-06 DIAGNOSIS — R911 Solitary pulmonary nodule: Secondary | ICD-10-CM | POA: Diagnosis not present

## 2022-06-06 DIAGNOSIS — J189 Pneumonia, unspecified organism: Secondary | ICD-10-CM | POA: Diagnosis not present

## 2022-06-06 DIAGNOSIS — J441 Chronic obstructive pulmonary disease with (acute) exacerbation: Secondary | ICD-10-CM | POA: Diagnosis not present

## 2022-06-06 DIAGNOSIS — I480 Paroxysmal atrial fibrillation: Secondary | ICD-10-CM | POA: Diagnosis not present

## 2022-06-06 DIAGNOSIS — Z85048 Personal history of other malignant neoplasm of rectum, rectosigmoid junction, and anus: Secondary | ICD-10-CM | POA: Diagnosis not present

## 2022-06-06 DIAGNOSIS — G894 Chronic pain syndrome: Secondary | ICD-10-CM | POA: Diagnosis not present

## 2022-06-07 DIAGNOSIS — S22000D Wedge compression fracture of unspecified thoracic vertebra, subsequent encounter for fracture with routine healing: Secondary | ICD-10-CM | POA: Diagnosis not present

## 2022-06-07 DIAGNOSIS — J189 Pneumonia, unspecified organism: Secondary | ICD-10-CM | POA: Diagnosis not present

## 2022-06-07 DIAGNOSIS — K219 Gastro-esophageal reflux disease without esophagitis: Secondary | ICD-10-CM | POA: Diagnosis not present

## 2022-06-07 DIAGNOSIS — G894 Chronic pain syndrome: Secondary | ICD-10-CM | POA: Diagnosis not present

## 2022-06-07 DIAGNOSIS — J440 Chronic obstructive pulmonary disease with acute lower respiratory infection: Secondary | ICD-10-CM | POA: Diagnosis not present

## 2022-06-07 DIAGNOSIS — I480 Paroxysmal atrial fibrillation: Secondary | ICD-10-CM | POA: Diagnosis not present

## 2022-06-07 DIAGNOSIS — J9611 Chronic respiratory failure with hypoxia: Secondary | ICD-10-CM | POA: Diagnosis not present

## 2022-06-07 DIAGNOSIS — K5909 Other constipation: Secondary | ICD-10-CM | POA: Diagnosis not present

## 2022-06-07 DIAGNOSIS — J441 Chronic obstructive pulmonary disease with (acute) exacerbation: Secondary | ICD-10-CM | POA: Diagnosis not present

## 2022-06-07 DIAGNOSIS — Z8619 Personal history of other infectious and parasitic diseases: Secondary | ICD-10-CM | POA: Diagnosis not present

## 2022-06-07 DIAGNOSIS — Z720 Tobacco use: Secondary | ICD-10-CM | POA: Diagnosis not present

## 2022-06-07 DIAGNOSIS — R911 Solitary pulmonary nodule: Secondary | ICD-10-CM | POA: Diagnosis not present

## 2022-06-07 DIAGNOSIS — Z85048 Personal history of other malignant neoplasm of rectum, rectosigmoid junction, and anus: Secondary | ICD-10-CM | POA: Diagnosis not present

## 2022-06-07 DIAGNOSIS — C210 Malignant neoplasm of anus, unspecified: Secondary | ICD-10-CM | POA: Diagnosis not present

## 2022-06-07 DIAGNOSIS — J449 Chronic obstructive pulmonary disease, unspecified: Secondary | ICD-10-CM | POA: Diagnosis not present

## 2022-06-07 DIAGNOSIS — Z95828 Presence of other vascular implants and grafts: Secondary | ICD-10-CM | POA: Diagnosis not present

## 2022-06-08 DIAGNOSIS — R911 Solitary pulmonary nodule: Secondary | ICD-10-CM | POA: Diagnosis not present

## 2022-06-08 DIAGNOSIS — K219 Gastro-esophageal reflux disease without esophagitis: Secondary | ICD-10-CM | POA: Diagnosis not present

## 2022-06-08 DIAGNOSIS — J9611 Chronic respiratory failure with hypoxia: Secondary | ICD-10-CM | POA: Diagnosis not present

## 2022-06-08 DIAGNOSIS — S22000D Wedge compression fracture of unspecified thoracic vertebra, subsequent encounter for fracture with routine healing: Secondary | ICD-10-CM | POA: Diagnosis not present

## 2022-06-08 DIAGNOSIS — J441 Chronic obstructive pulmonary disease with (acute) exacerbation: Secondary | ICD-10-CM | POA: Diagnosis not present

## 2022-06-08 DIAGNOSIS — J440 Chronic obstructive pulmonary disease with acute lower respiratory infection: Secondary | ICD-10-CM | POA: Diagnosis not present

## 2022-06-08 DIAGNOSIS — Z85048 Personal history of other malignant neoplasm of rectum, rectosigmoid junction, and anus: Secondary | ICD-10-CM | POA: Diagnosis not present

## 2022-06-08 DIAGNOSIS — Z8619 Personal history of other infectious and parasitic diseases: Secondary | ICD-10-CM | POA: Diagnosis not present

## 2022-06-08 DIAGNOSIS — J189 Pneumonia, unspecified organism: Secondary | ICD-10-CM | POA: Diagnosis not present

## 2022-06-08 DIAGNOSIS — K5909 Other constipation: Secondary | ICD-10-CM | POA: Diagnosis not present

## 2022-06-08 DIAGNOSIS — I480 Paroxysmal atrial fibrillation: Secondary | ICD-10-CM | POA: Diagnosis not present

## 2022-06-08 DIAGNOSIS — Z720 Tobacco use: Secondary | ICD-10-CM | POA: Diagnosis not present

## 2022-06-08 DIAGNOSIS — G894 Chronic pain syndrome: Secondary | ICD-10-CM | POA: Diagnosis not present

## 2022-06-09 DIAGNOSIS — I480 Paroxysmal atrial fibrillation: Secondary | ICD-10-CM | POA: Diagnosis not present

## 2022-06-09 DIAGNOSIS — J189 Pneumonia, unspecified organism: Secondary | ICD-10-CM | POA: Diagnosis not present

## 2022-06-09 DIAGNOSIS — K219 Gastro-esophageal reflux disease without esophagitis: Secondary | ICD-10-CM | POA: Diagnosis not present

## 2022-06-09 DIAGNOSIS — G894 Chronic pain syndrome: Secondary | ICD-10-CM | POA: Diagnosis not present

## 2022-06-09 DIAGNOSIS — J441 Chronic obstructive pulmonary disease with (acute) exacerbation: Secondary | ICD-10-CM | POA: Diagnosis not present

## 2022-06-09 DIAGNOSIS — J440 Chronic obstructive pulmonary disease with acute lower respiratory infection: Secondary | ICD-10-CM | POA: Diagnosis not present

## 2022-06-09 DIAGNOSIS — R911 Solitary pulmonary nodule: Secondary | ICD-10-CM | POA: Diagnosis not present

## 2022-06-09 DIAGNOSIS — K5909 Other constipation: Secondary | ICD-10-CM | POA: Diagnosis not present

## 2022-06-09 DIAGNOSIS — S22000D Wedge compression fracture of unspecified thoracic vertebra, subsequent encounter for fracture with routine healing: Secondary | ICD-10-CM | POA: Diagnosis not present

## 2022-06-09 DIAGNOSIS — Z8619 Personal history of other infectious and parasitic diseases: Secondary | ICD-10-CM | POA: Diagnosis not present

## 2022-06-09 DIAGNOSIS — J9611 Chronic respiratory failure with hypoxia: Secondary | ICD-10-CM | POA: Diagnosis not present

## 2022-06-09 DIAGNOSIS — Z85048 Personal history of other malignant neoplasm of rectum, rectosigmoid junction, and anus: Secondary | ICD-10-CM | POA: Diagnosis not present

## 2022-06-09 DIAGNOSIS — Z720 Tobacco use: Secondary | ICD-10-CM | POA: Diagnosis not present

## 2022-06-10 DIAGNOSIS — Z8619 Personal history of other infectious and parasitic diseases: Secondary | ICD-10-CM | POA: Diagnosis not present

## 2022-06-10 DIAGNOSIS — K5909 Other constipation: Secondary | ICD-10-CM | POA: Diagnosis not present

## 2022-06-10 DIAGNOSIS — G894 Chronic pain syndrome: Secondary | ICD-10-CM | POA: Diagnosis not present

## 2022-06-10 DIAGNOSIS — I480 Paroxysmal atrial fibrillation: Secondary | ICD-10-CM | POA: Diagnosis not present

## 2022-06-10 DIAGNOSIS — Z85048 Personal history of other malignant neoplasm of rectum, rectosigmoid junction, and anus: Secondary | ICD-10-CM | POA: Diagnosis not present

## 2022-06-10 DIAGNOSIS — J440 Chronic obstructive pulmonary disease with acute lower respiratory infection: Secondary | ICD-10-CM | POA: Diagnosis not present

## 2022-06-10 DIAGNOSIS — S22000D Wedge compression fracture of unspecified thoracic vertebra, subsequent encounter for fracture with routine healing: Secondary | ICD-10-CM | POA: Diagnosis not present

## 2022-06-10 DIAGNOSIS — K219 Gastro-esophageal reflux disease without esophagitis: Secondary | ICD-10-CM | POA: Diagnosis not present

## 2022-06-10 DIAGNOSIS — Z720 Tobacco use: Secondary | ICD-10-CM | POA: Diagnosis not present

## 2022-06-10 DIAGNOSIS — R911 Solitary pulmonary nodule: Secondary | ICD-10-CM | POA: Diagnosis not present

## 2022-06-10 DIAGNOSIS — J9611 Chronic respiratory failure with hypoxia: Secondary | ICD-10-CM | POA: Diagnosis not present

## 2022-06-10 DIAGNOSIS — J441 Chronic obstructive pulmonary disease with (acute) exacerbation: Secondary | ICD-10-CM | POA: Diagnosis not present

## 2022-06-10 DIAGNOSIS — J189 Pneumonia, unspecified organism: Secondary | ICD-10-CM | POA: Diagnosis not present

## 2022-06-11 DIAGNOSIS — I480 Paroxysmal atrial fibrillation: Secondary | ICD-10-CM | POA: Diagnosis not present

## 2022-06-11 DIAGNOSIS — G894 Chronic pain syndrome: Secondary | ICD-10-CM | POA: Diagnosis not present

## 2022-06-11 DIAGNOSIS — S22000D Wedge compression fracture of unspecified thoracic vertebra, subsequent encounter for fracture with routine healing: Secondary | ICD-10-CM | POA: Diagnosis not present

## 2022-06-11 DIAGNOSIS — J441 Chronic obstructive pulmonary disease with (acute) exacerbation: Secondary | ICD-10-CM | POA: Diagnosis not present

## 2022-06-11 DIAGNOSIS — Z720 Tobacco use: Secondary | ICD-10-CM | POA: Diagnosis not present

## 2022-06-11 DIAGNOSIS — J189 Pneumonia, unspecified organism: Secondary | ICD-10-CM | POA: Diagnosis not present

## 2022-06-11 DIAGNOSIS — Z85048 Personal history of other malignant neoplasm of rectum, rectosigmoid junction, and anus: Secondary | ICD-10-CM | POA: Diagnosis not present

## 2022-06-11 DIAGNOSIS — K5909 Other constipation: Secondary | ICD-10-CM | POA: Diagnosis not present

## 2022-06-11 DIAGNOSIS — R911 Solitary pulmonary nodule: Secondary | ICD-10-CM | POA: Diagnosis not present

## 2022-06-11 DIAGNOSIS — J9611 Chronic respiratory failure with hypoxia: Secondary | ICD-10-CM | POA: Diagnosis not present

## 2022-06-11 DIAGNOSIS — Z8619 Personal history of other infectious and parasitic diseases: Secondary | ICD-10-CM | POA: Diagnosis not present

## 2022-06-11 DIAGNOSIS — J440 Chronic obstructive pulmonary disease with acute lower respiratory infection: Secondary | ICD-10-CM | POA: Diagnosis not present

## 2022-06-11 DIAGNOSIS — K219 Gastro-esophageal reflux disease without esophagitis: Secondary | ICD-10-CM | POA: Diagnosis not present

## 2022-06-12 DIAGNOSIS — J440 Chronic obstructive pulmonary disease with acute lower respiratory infection: Secondary | ICD-10-CM | POA: Diagnosis not present

## 2022-06-12 DIAGNOSIS — J189 Pneumonia, unspecified organism: Secondary | ICD-10-CM | POA: Diagnosis not present

## 2022-06-12 DIAGNOSIS — Z85048 Personal history of other malignant neoplasm of rectum, rectosigmoid junction, and anus: Secondary | ICD-10-CM | POA: Diagnosis not present

## 2022-06-12 DIAGNOSIS — I480 Paroxysmal atrial fibrillation: Secondary | ICD-10-CM | POA: Diagnosis not present

## 2022-06-12 DIAGNOSIS — K5909 Other constipation: Secondary | ICD-10-CM | POA: Diagnosis not present

## 2022-06-12 DIAGNOSIS — J9611 Chronic respiratory failure with hypoxia: Secondary | ICD-10-CM | POA: Diagnosis not present

## 2022-06-12 DIAGNOSIS — Z8619 Personal history of other infectious and parasitic diseases: Secondary | ICD-10-CM | POA: Diagnosis not present

## 2022-06-12 DIAGNOSIS — Z720 Tobacco use: Secondary | ICD-10-CM | POA: Diagnosis not present

## 2022-06-12 DIAGNOSIS — S22000D Wedge compression fracture of unspecified thoracic vertebra, subsequent encounter for fracture with routine healing: Secondary | ICD-10-CM | POA: Diagnosis not present

## 2022-06-12 DIAGNOSIS — K219 Gastro-esophageal reflux disease without esophagitis: Secondary | ICD-10-CM | POA: Diagnosis not present

## 2022-06-12 DIAGNOSIS — R911 Solitary pulmonary nodule: Secondary | ICD-10-CM | POA: Diagnosis not present

## 2022-06-12 DIAGNOSIS — J449 Chronic obstructive pulmonary disease, unspecified: Secondary | ICD-10-CM | POA: Diagnosis not present

## 2022-06-12 DIAGNOSIS — J441 Chronic obstructive pulmonary disease with (acute) exacerbation: Secondary | ICD-10-CM | POA: Diagnosis not present

## 2022-06-12 DIAGNOSIS — G894 Chronic pain syndrome: Secondary | ICD-10-CM | POA: Diagnosis not present

## 2022-06-13 DIAGNOSIS — J441 Chronic obstructive pulmonary disease with (acute) exacerbation: Secondary | ICD-10-CM | POA: Diagnosis not present

## 2022-06-13 DIAGNOSIS — I480 Paroxysmal atrial fibrillation: Secondary | ICD-10-CM | POA: Diagnosis not present

## 2022-06-13 DIAGNOSIS — K219 Gastro-esophageal reflux disease without esophagitis: Secondary | ICD-10-CM | POA: Diagnosis not present

## 2022-06-13 DIAGNOSIS — J440 Chronic obstructive pulmonary disease with acute lower respiratory infection: Secondary | ICD-10-CM | POA: Diagnosis not present

## 2022-06-13 DIAGNOSIS — Z8619 Personal history of other infectious and parasitic diseases: Secondary | ICD-10-CM | POA: Diagnosis not present

## 2022-06-13 DIAGNOSIS — Z720 Tobacco use: Secondary | ICD-10-CM | POA: Diagnosis not present

## 2022-06-13 DIAGNOSIS — Z85048 Personal history of other malignant neoplasm of rectum, rectosigmoid junction, and anus: Secondary | ICD-10-CM | POA: Diagnosis not present

## 2022-06-13 DIAGNOSIS — K5909 Other constipation: Secondary | ICD-10-CM | POA: Diagnosis not present

## 2022-06-13 DIAGNOSIS — S22000D Wedge compression fracture of unspecified thoracic vertebra, subsequent encounter for fracture with routine healing: Secondary | ICD-10-CM | POA: Diagnosis not present

## 2022-06-13 DIAGNOSIS — J9611 Chronic respiratory failure with hypoxia: Secondary | ICD-10-CM | POA: Diagnosis not present

## 2022-06-13 DIAGNOSIS — R911 Solitary pulmonary nodule: Secondary | ICD-10-CM | POA: Diagnosis not present

## 2022-06-13 DIAGNOSIS — G894 Chronic pain syndrome: Secondary | ICD-10-CM | POA: Diagnosis not present

## 2022-06-13 DIAGNOSIS — J189 Pneumonia, unspecified organism: Secondary | ICD-10-CM | POA: Diagnosis not present

## 2022-06-14 DIAGNOSIS — I480 Paroxysmal atrial fibrillation: Secondary | ICD-10-CM | POA: Diagnosis not present

## 2022-06-14 DIAGNOSIS — Z720 Tobacco use: Secondary | ICD-10-CM | POA: Diagnosis not present

## 2022-06-14 DIAGNOSIS — K5909 Other constipation: Secondary | ICD-10-CM | POA: Diagnosis not present

## 2022-06-14 DIAGNOSIS — J441 Chronic obstructive pulmonary disease with (acute) exacerbation: Secondary | ICD-10-CM | POA: Diagnosis not present

## 2022-06-14 DIAGNOSIS — S22000D Wedge compression fracture of unspecified thoracic vertebra, subsequent encounter for fracture with routine healing: Secondary | ICD-10-CM | POA: Diagnosis not present

## 2022-06-14 DIAGNOSIS — G894 Chronic pain syndrome: Secondary | ICD-10-CM | POA: Diagnosis not present

## 2022-06-14 DIAGNOSIS — Z85048 Personal history of other malignant neoplasm of rectum, rectosigmoid junction, and anus: Secondary | ICD-10-CM | POA: Diagnosis not present

## 2022-06-14 DIAGNOSIS — J9611 Chronic respiratory failure with hypoxia: Secondary | ICD-10-CM | POA: Diagnosis not present

## 2022-06-14 DIAGNOSIS — R911 Solitary pulmonary nodule: Secondary | ICD-10-CM | POA: Diagnosis not present

## 2022-06-14 DIAGNOSIS — J189 Pneumonia, unspecified organism: Secondary | ICD-10-CM | POA: Diagnosis not present

## 2022-06-14 DIAGNOSIS — J440 Chronic obstructive pulmonary disease with acute lower respiratory infection: Secondary | ICD-10-CM | POA: Diagnosis not present

## 2022-06-14 DIAGNOSIS — K219 Gastro-esophageal reflux disease without esophagitis: Secondary | ICD-10-CM | POA: Diagnosis not present

## 2022-06-14 DIAGNOSIS — Z8619 Personal history of other infectious and parasitic diseases: Secondary | ICD-10-CM | POA: Diagnosis not present

## 2022-06-15 DIAGNOSIS — K219 Gastro-esophageal reflux disease without esophagitis: Secondary | ICD-10-CM | POA: Diagnosis not present

## 2022-06-15 DIAGNOSIS — Z720 Tobacco use: Secondary | ICD-10-CM | POA: Diagnosis not present

## 2022-06-15 DIAGNOSIS — I480 Paroxysmal atrial fibrillation: Secondary | ICD-10-CM | POA: Diagnosis not present

## 2022-06-15 DIAGNOSIS — S22000D Wedge compression fracture of unspecified thoracic vertebra, subsequent encounter for fracture with routine healing: Secondary | ICD-10-CM | POA: Diagnosis not present

## 2022-06-15 DIAGNOSIS — J189 Pneumonia, unspecified organism: Secondary | ICD-10-CM | POA: Diagnosis not present

## 2022-06-15 DIAGNOSIS — G894 Chronic pain syndrome: Secondary | ICD-10-CM | POA: Diagnosis not present

## 2022-06-15 DIAGNOSIS — J440 Chronic obstructive pulmonary disease with acute lower respiratory infection: Secondary | ICD-10-CM | POA: Diagnosis not present

## 2022-06-15 DIAGNOSIS — J9611 Chronic respiratory failure with hypoxia: Secondary | ICD-10-CM | POA: Diagnosis not present

## 2022-06-15 DIAGNOSIS — J441 Chronic obstructive pulmonary disease with (acute) exacerbation: Secondary | ICD-10-CM | POA: Diagnosis not present

## 2022-06-15 DIAGNOSIS — K5909 Other constipation: Secondary | ICD-10-CM | POA: Diagnosis not present

## 2022-06-15 DIAGNOSIS — R911 Solitary pulmonary nodule: Secondary | ICD-10-CM | POA: Diagnosis not present

## 2022-06-15 DIAGNOSIS — Z8619 Personal history of other infectious and parasitic diseases: Secondary | ICD-10-CM | POA: Diagnosis not present

## 2022-06-15 DIAGNOSIS — Z85048 Personal history of other malignant neoplasm of rectum, rectosigmoid junction, and anus: Secondary | ICD-10-CM | POA: Diagnosis not present

## 2022-06-15 DIAGNOSIS — Z419 Encounter for procedure for purposes other than remedying health state, unspecified: Secondary | ICD-10-CM | POA: Diagnosis not present

## 2022-06-16 DIAGNOSIS — J441 Chronic obstructive pulmonary disease with (acute) exacerbation: Secondary | ICD-10-CM | POA: Diagnosis not present

## 2022-06-16 DIAGNOSIS — S22000D Wedge compression fracture of unspecified thoracic vertebra, subsequent encounter for fracture with routine healing: Secondary | ICD-10-CM | POA: Diagnosis not present

## 2022-06-16 DIAGNOSIS — I480 Paroxysmal atrial fibrillation: Secondary | ICD-10-CM | POA: Diagnosis not present

## 2022-06-16 DIAGNOSIS — J440 Chronic obstructive pulmonary disease with acute lower respiratory infection: Secondary | ICD-10-CM | POA: Diagnosis not present

## 2022-06-16 DIAGNOSIS — J189 Pneumonia, unspecified organism: Secondary | ICD-10-CM | POA: Diagnosis not present

## 2022-06-16 DIAGNOSIS — R911 Solitary pulmonary nodule: Secondary | ICD-10-CM | POA: Diagnosis not present

## 2022-06-16 DIAGNOSIS — Z85048 Personal history of other malignant neoplasm of rectum, rectosigmoid junction, and anus: Secondary | ICD-10-CM | POA: Diagnosis not present

## 2022-06-16 DIAGNOSIS — G894 Chronic pain syndrome: Secondary | ICD-10-CM | POA: Diagnosis not present

## 2022-06-16 DIAGNOSIS — K5909 Other constipation: Secondary | ICD-10-CM | POA: Diagnosis not present

## 2022-06-16 DIAGNOSIS — J9611 Chronic respiratory failure with hypoxia: Secondary | ICD-10-CM | POA: Diagnosis not present

## 2022-06-16 DIAGNOSIS — Z720 Tobacco use: Secondary | ICD-10-CM | POA: Diagnosis not present

## 2022-06-16 DIAGNOSIS — Z8619 Personal history of other infectious and parasitic diseases: Secondary | ICD-10-CM | POA: Diagnosis not present

## 2022-06-16 DIAGNOSIS — K219 Gastro-esophageal reflux disease without esophagitis: Secondary | ICD-10-CM | POA: Diagnosis not present

## 2022-06-17 DIAGNOSIS — Z85048 Personal history of other malignant neoplasm of rectum, rectosigmoid junction, and anus: Secondary | ICD-10-CM | POA: Diagnosis not present

## 2022-06-17 DIAGNOSIS — Z8619 Personal history of other infectious and parasitic diseases: Secondary | ICD-10-CM | POA: Diagnosis not present

## 2022-06-17 DIAGNOSIS — K5909 Other constipation: Secondary | ICD-10-CM | POA: Diagnosis not present

## 2022-06-17 DIAGNOSIS — Z720 Tobacco use: Secondary | ICD-10-CM | POA: Diagnosis not present

## 2022-06-17 DIAGNOSIS — J9611 Chronic respiratory failure with hypoxia: Secondary | ICD-10-CM | POA: Diagnosis not present

## 2022-06-17 DIAGNOSIS — I480 Paroxysmal atrial fibrillation: Secondary | ICD-10-CM | POA: Diagnosis not present

## 2022-06-17 DIAGNOSIS — J440 Chronic obstructive pulmonary disease with acute lower respiratory infection: Secondary | ICD-10-CM | POA: Diagnosis not present

## 2022-06-17 DIAGNOSIS — R911 Solitary pulmonary nodule: Secondary | ICD-10-CM | POA: Diagnosis not present

## 2022-06-17 DIAGNOSIS — J189 Pneumonia, unspecified organism: Secondary | ICD-10-CM | POA: Diagnosis not present

## 2022-06-17 DIAGNOSIS — K219 Gastro-esophageal reflux disease without esophagitis: Secondary | ICD-10-CM | POA: Diagnosis not present

## 2022-06-17 DIAGNOSIS — S22000D Wedge compression fracture of unspecified thoracic vertebra, subsequent encounter for fracture with routine healing: Secondary | ICD-10-CM | POA: Diagnosis not present

## 2022-06-17 DIAGNOSIS — J441 Chronic obstructive pulmonary disease with (acute) exacerbation: Secondary | ICD-10-CM | POA: Diagnosis not present

## 2022-06-17 DIAGNOSIS — G894 Chronic pain syndrome: Secondary | ICD-10-CM | POA: Diagnosis not present

## 2022-06-18 DIAGNOSIS — G894 Chronic pain syndrome: Secondary | ICD-10-CM | POA: Diagnosis not present

## 2022-06-18 DIAGNOSIS — J440 Chronic obstructive pulmonary disease with acute lower respiratory infection: Secondary | ICD-10-CM | POA: Diagnosis not present

## 2022-06-18 DIAGNOSIS — J441 Chronic obstructive pulmonary disease with (acute) exacerbation: Secondary | ICD-10-CM | POA: Diagnosis not present

## 2022-06-18 DIAGNOSIS — J189 Pneumonia, unspecified organism: Secondary | ICD-10-CM | POA: Diagnosis not present

## 2022-06-18 DIAGNOSIS — S22000D Wedge compression fracture of unspecified thoracic vertebra, subsequent encounter for fracture with routine healing: Secondary | ICD-10-CM | POA: Diagnosis not present

## 2022-06-18 DIAGNOSIS — Z720 Tobacco use: Secondary | ICD-10-CM | POA: Diagnosis not present

## 2022-06-18 DIAGNOSIS — Z8619 Personal history of other infectious and parasitic diseases: Secondary | ICD-10-CM | POA: Diagnosis not present

## 2022-06-18 DIAGNOSIS — K5909 Other constipation: Secondary | ICD-10-CM | POA: Diagnosis not present

## 2022-06-18 DIAGNOSIS — J9611 Chronic respiratory failure with hypoxia: Secondary | ICD-10-CM | POA: Diagnosis not present

## 2022-06-18 DIAGNOSIS — I480 Paroxysmal atrial fibrillation: Secondary | ICD-10-CM | POA: Diagnosis not present

## 2022-06-18 DIAGNOSIS — K219 Gastro-esophageal reflux disease without esophagitis: Secondary | ICD-10-CM | POA: Diagnosis not present

## 2022-06-18 DIAGNOSIS — Z85048 Personal history of other malignant neoplasm of rectum, rectosigmoid junction, and anus: Secondary | ICD-10-CM | POA: Diagnosis not present

## 2022-06-18 DIAGNOSIS — R911 Solitary pulmonary nodule: Secondary | ICD-10-CM | POA: Diagnosis not present

## 2022-06-19 DIAGNOSIS — J189 Pneumonia, unspecified organism: Secondary | ICD-10-CM | POA: Diagnosis not present

## 2022-06-19 DIAGNOSIS — S22000D Wedge compression fracture of unspecified thoracic vertebra, subsequent encounter for fracture with routine healing: Secondary | ICD-10-CM | POA: Diagnosis not present

## 2022-06-19 DIAGNOSIS — K5909 Other constipation: Secondary | ICD-10-CM | POA: Diagnosis not present

## 2022-06-19 DIAGNOSIS — J9611 Chronic respiratory failure with hypoxia: Secondary | ICD-10-CM | POA: Diagnosis not present

## 2022-06-19 DIAGNOSIS — Z8619 Personal history of other infectious and parasitic diseases: Secondary | ICD-10-CM | POA: Diagnosis not present

## 2022-06-19 DIAGNOSIS — G894 Chronic pain syndrome: Secondary | ICD-10-CM | POA: Diagnosis not present

## 2022-06-19 DIAGNOSIS — K219 Gastro-esophageal reflux disease without esophagitis: Secondary | ICD-10-CM | POA: Diagnosis not present

## 2022-06-19 DIAGNOSIS — I480 Paroxysmal atrial fibrillation: Secondary | ICD-10-CM | POA: Diagnosis not present

## 2022-06-19 DIAGNOSIS — Z85048 Personal history of other malignant neoplasm of rectum, rectosigmoid junction, and anus: Secondary | ICD-10-CM | POA: Diagnosis not present

## 2022-06-19 DIAGNOSIS — J440 Chronic obstructive pulmonary disease with acute lower respiratory infection: Secondary | ICD-10-CM | POA: Diagnosis not present

## 2022-06-19 DIAGNOSIS — Z720 Tobacco use: Secondary | ICD-10-CM | POA: Diagnosis not present

## 2022-06-19 DIAGNOSIS — J441 Chronic obstructive pulmonary disease with (acute) exacerbation: Secondary | ICD-10-CM | POA: Diagnosis not present

## 2022-06-19 DIAGNOSIS — R911 Solitary pulmonary nodule: Secondary | ICD-10-CM | POA: Diagnosis not present

## 2022-06-20 DIAGNOSIS — J441 Chronic obstructive pulmonary disease with (acute) exacerbation: Secondary | ICD-10-CM | POA: Diagnosis not present

## 2022-06-20 DIAGNOSIS — I480 Paroxysmal atrial fibrillation: Secondary | ICD-10-CM | POA: Diagnosis not present

## 2022-06-20 DIAGNOSIS — G894 Chronic pain syndrome: Secondary | ICD-10-CM | POA: Diagnosis not present

## 2022-06-20 DIAGNOSIS — S22000D Wedge compression fracture of unspecified thoracic vertebra, subsequent encounter for fracture with routine healing: Secondary | ICD-10-CM | POA: Diagnosis not present

## 2022-06-20 DIAGNOSIS — Z8619 Personal history of other infectious and parasitic diseases: Secondary | ICD-10-CM | POA: Diagnosis not present

## 2022-06-20 DIAGNOSIS — J9611 Chronic respiratory failure with hypoxia: Secondary | ICD-10-CM | POA: Diagnosis not present

## 2022-06-20 DIAGNOSIS — K5909 Other constipation: Secondary | ICD-10-CM | POA: Diagnosis not present

## 2022-06-20 DIAGNOSIS — J189 Pneumonia, unspecified organism: Secondary | ICD-10-CM | POA: Diagnosis not present

## 2022-06-20 DIAGNOSIS — Z85048 Personal history of other malignant neoplasm of rectum, rectosigmoid junction, and anus: Secondary | ICD-10-CM | POA: Diagnosis not present

## 2022-06-20 DIAGNOSIS — R911 Solitary pulmonary nodule: Secondary | ICD-10-CM | POA: Diagnosis not present

## 2022-06-20 DIAGNOSIS — J440 Chronic obstructive pulmonary disease with acute lower respiratory infection: Secondary | ICD-10-CM | POA: Diagnosis not present

## 2022-06-20 DIAGNOSIS — K219 Gastro-esophageal reflux disease without esophagitis: Secondary | ICD-10-CM | POA: Diagnosis not present

## 2022-06-20 DIAGNOSIS — Z720 Tobacco use: Secondary | ICD-10-CM | POA: Diagnosis not present

## 2022-06-21 ENCOUNTER — Encounter: Payer: Self-pay | Admitting: Physician Assistant

## 2022-06-21 ENCOUNTER — Telehealth (INDEPENDENT_AMBULATORY_CARE_PROVIDER_SITE_OTHER): Payer: Medicaid Other | Admitting: Physician Assistant

## 2022-06-21 VITALS — BP 128/83

## 2022-06-21 DIAGNOSIS — Z8619 Personal history of other infectious and parasitic diseases: Secondary | ICD-10-CM | POA: Diagnosis not present

## 2022-06-21 DIAGNOSIS — K219 Gastro-esophageal reflux disease without esophagitis: Secondary | ICD-10-CM | POA: Diagnosis not present

## 2022-06-21 DIAGNOSIS — J189 Pneumonia, unspecified organism: Secondary | ICD-10-CM | POA: Diagnosis not present

## 2022-06-21 DIAGNOSIS — J9611 Chronic respiratory failure with hypoxia: Secondary | ICD-10-CM | POA: Diagnosis not present

## 2022-06-21 DIAGNOSIS — J449 Chronic obstructive pulmonary disease, unspecified: Secondary | ICD-10-CM

## 2022-06-21 DIAGNOSIS — G894 Chronic pain syndrome: Secondary | ICD-10-CM | POA: Diagnosis not present

## 2022-06-21 DIAGNOSIS — I480 Paroxysmal atrial fibrillation: Secondary | ICD-10-CM | POA: Diagnosis not present

## 2022-06-21 DIAGNOSIS — Z85048 Personal history of other malignant neoplasm of rectum, rectosigmoid junction, and anus: Secondary | ICD-10-CM | POA: Diagnosis not present

## 2022-06-21 DIAGNOSIS — K5909 Other constipation: Secondary | ICD-10-CM | POA: Diagnosis not present

## 2022-06-21 DIAGNOSIS — R911 Solitary pulmonary nodule: Secondary | ICD-10-CM | POA: Diagnosis not present

## 2022-06-21 DIAGNOSIS — J440 Chronic obstructive pulmonary disease with acute lower respiratory infection: Secondary | ICD-10-CM | POA: Diagnosis not present

## 2022-06-21 DIAGNOSIS — S22000D Wedge compression fracture of unspecified thoracic vertebra, subsequent encounter for fracture with routine healing: Secondary | ICD-10-CM | POA: Diagnosis not present

## 2022-06-21 DIAGNOSIS — M5136 Other intervertebral disc degeneration, lumbar region: Secondary | ICD-10-CM | POA: Diagnosis not present

## 2022-06-21 DIAGNOSIS — Z720 Tobacco use: Secondary | ICD-10-CM | POA: Diagnosis not present

## 2022-06-21 DIAGNOSIS — J441 Chronic obstructive pulmonary disease with (acute) exacerbation: Secondary | ICD-10-CM | POA: Diagnosis not present

## 2022-06-21 MED ORDER — OXYCODONE-ACETAMINOPHEN 10-325 MG PO TABS: 1.0000 | ORAL_TABLET | Freq: Four times a day (QID) | ORAL | 0 refills | Status: DC | PRN

## 2022-06-21 MED ORDER — OXYCODONE-ACETAMINOPHEN 10-325 MG PO TABS
1.0000 | ORAL_TABLET | Freq: Four times a day (QID) | ORAL | 0 refills | Status: DC | PRN
Start: 2022-08-21 — End: 2022-08-30

## 2022-06-21 NOTE — Progress Notes (Signed)
..Virtual Visit via Video Note  I connected with Sue Smith on 06/21/22 at  3:20 PM EDT by a video enabled telemedicine application and verified that I am speaking with the correct person using two identifiers.  Location: Patient: home Provider: clinic  .Marland KitchenParticipating in visit:  Patient: Sue Smith Provider:Brecken Walth PA-C   I discussed the limitations of evaluation and management by telemedicine and the availability of in person appointments. The patient expressed understanding and agreed to proceed.  History of Present Illness: Pt is a 67 yo female with end stage COPD, HTN, chronic pain, lumbar DDD who calls in for refills of pain medication.   No concerns. Pain controlled. Breathing is stable. Good and bad days.   .. 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   Simple chronic bronchitis ([facility]) 08/27/2016   Actinic keratosis 11/29/2016   Chronic pain syndrome 11/29/2016   Gastroesophageal reflux disease 11/29/2016   Current smoker 02/26/2017   Grief 06/30/2017   No energy 07/29/2018   Irregular heart rhythm 05/06/2019   PAC (premature atrial contraction) 05/06/2019   PVC (premature ventricular contraction) 05/06/2019   Atrial fibrillation with RVR ([facility]) 11/16/2019   COVID-19 virus infection 11/16/2019   Anal lesion 07/10/2020   Anal cancer ([facility]) 10/18/2020   Drug-induced constipation 03/26/2021   Common bile duct dilation 03/26/2021   History of rectal or anal cancer 03/26/2021   Colitis 03/26/2021   Hypocalcemia 04/30/2021   Clostridium difficile colitis 04/30/2021    Leukocytosis 05/01/2021   Unintended weight loss 05/01/2021   Compression fracture of body of thoracic vertebra ([facility]) 11/20/2021   Thrush 12/24/2021   End stage COPD ([facility]) 03/19/2022   Anxiety about health 03/19/2022   Pneumonia of both lower lobes due to infectious organism 04/15/2022   History of Clostridioides difficile infection 04/15/2022   Chronic respiratory failure with hypoxia ([facility]) 04/15/2022   Resolved Ambulatory Problems    Diagnosis Date Noted   Post-viral cough syndrome 11/16/2019   Past Medical History:  Diagnosis Date   Cervical dysplasia    COPD (chronic obstructive pulmonary disease) ([facility])    Hypertension        Observations/Objective: No acute distress On continuous O2 2-4L with some labored breathing Normal mood and appearance  .Marland Kitchen Today's Vitals   06/21/22 1503  BP: 128/83   There is no height or weight on file to calculate BMI.    Assessment and Plan: Marland KitchenMarland KitchenDiagnoses and all orders for this visit:  Chronic pain syndrome -     oxyCODONE-acetaminophen (PERCOCET) 10-325 MG tablet; Take 1 tablet by mouth every 6 (six) hours as needed for pain. -     oxyCODONE-acetaminophen (PERCOCET) 10-325 MG tablet; Take 1 tablet by mouth every 6 (six) hours as needed for pain. -     oxyCODONE-acetaminophen (PERCOCET) 10-325 MG tablet; Take 1 tablet by mouth every 6 (six) hours as needed for pain.  DDD (degenerative disc disease), lumbar -     oxyCODONE-acetaminophen (PERCOCET) 10-325 MG tablet; Take 1 tablet by mouth every 6 (six) hours as needed for pain. -     oxyCODONE-acetaminophen (PERCOCET) 10-325 MG tablet; Take 1 tablet by mouth every 6 (six)  hours as needed for pain. -     oxyCODONE-acetaminophen (PERCOCET) 10-325 MG tablet; Take 1 tablet by mouth every 6 (six) hours as needed for pain.  End stage COPD ([facility])   UTD pain contract and visit UTD UDS .Marland KitchenPDMP reviewed during this encounter. No concerns Refilled oxycodone for 3 months  COPD managed by  pulmonology-medications reviewed and updated      Follow Up Instructions:    I discussed the assessment and treatment plan with the patient. The patient was provided an opportunity to ask questions and all were answered. The patient agreed with the plan and demonstrated an understanding of the instructions.   The patient was advised to call back or seek an in-person evaluation if the symptoms worsen or if the condition fails to improve as anticipated.   Tandy Gaw, PA-C

## 2022-06-22 ENCOUNTER — Encounter: Payer: Self-pay | Admitting: Physician Assistant

## 2022-06-22 ENCOUNTER — Other Ambulatory Visit: Payer: Self-pay | Admitting: Physician Assistant

## 2022-06-22 DIAGNOSIS — Z720 Tobacco use: Secondary | ICD-10-CM | POA: Diagnosis not present

## 2022-06-22 DIAGNOSIS — Z85048 Personal history of other malignant neoplasm of rectum, rectosigmoid junction, and anus: Secondary | ICD-10-CM | POA: Diagnosis not present

## 2022-06-22 DIAGNOSIS — I480 Paroxysmal atrial fibrillation: Secondary | ICD-10-CM | POA: Diagnosis not present

## 2022-06-22 DIAGNOSIS — S22000D Wedge compression fracture of unspecified thoracic vertebra, subsequent encounter for fracture with routine healing: Secondary | ICD-10-CM | POA: Diagnosis not present

## 2022-06-22 DIAGNOSIS — J189 Pneumonia, unspecified organism: Secondary | ICD-10-CM | POA: Diagnosis not present

## 2022-06-22 DIAGNOSIS — G894 Chronic pain syndrome: Secondary | ICD-10-CM | POA: Diagnosis not present

## 2022-06-22 DIAGNOSIS — J9611 Chronic respiratory failure with hypoxia: Secondary | ICD-10-CM | POA: Diagnosis not present

## 2022-06-22 DIAGNOSIS — R911 Solitary pulmonary nodule: Secondary | ICD-10-CM | POA: Diagnosis not present

## 2022-06-22 DIAGNOSIS — R4589 Other symptoms and signs involving emotional state: Secondary | ICD-10-CM

## 2022-06-22 DIAGNOSIS — K5909 Other constipation: Secondary | ICD-10-CM | POA: Diagnosis not present

## 2022-06-22 DIAGNOSIS — Z8619 Personal history of other infectious and parasitic diseases: Secondary | ICD-10-CM | POA: Diagnosis not present

## 2022-06-22 DIAGNOSIS — K219 Gastro-esophageal reflux disease without esophagitis: Secondary | ICD-10-CM | POA: Diagnosis not present

## 2022-06-22 DIAGNOSIS — J440 Chronic obstructive pulmonary disease with acute lower respiratory infection: Secondary | ICD-10-CM | POA: Diagnosis not present

## 2022-06-22 DIAGNOSIS — J441 Chronic obstructive pulmonary disease with (acute) exacerbation: Secondary | ICD-10-CM | POA: Diagnosis not present

## 2022-06-23 DIAGNOSIS — K219 Gastro-esophageal reflux disease without esophagitis: Secondary | ICD-10-CM | POA: Diagnosis not present

## 2022-06-23 DIAGNOSIS — Z85048 Personal history of other malignant neoplasm of rectum, rectosigmoid junction, and anus: Secondary | ICD-10-CM | POA: Diagnosis not present

## 2022-06-23 DIAGNOSIS — Z720 Tobacco use: Secondary | ICD-10-CM | POA: Diagnosis not present

## 2022-06-23 DIAGNOSIS — R911 Solitary pulmonary nodule: Secondary | ICD-10-CM | POA: Diagnosis not present

## 2022-06-23 DIAGNOSIS — J189 Pneumonia, unspecified organism: Secondary | ICD-10-CM | POA: Diagnosis not present

## 2022-06-23 DIAGNOSIS — S22000D Wedge compression fracture of unspecified thoracic vertebra, subsequent encounter for fracture with routine healing: Secondary | ICD-10-CM | POA: Diagnosis not present

## 2022-06-23 DIAGNOSIS — Z8619 Personal history of other infectious and parasitic diseases: Secondary | ICD-10-CM | POA: Diagnosis not present

## 2022-06-23 DIAGNOSIS — I480 Paroxysmal atrial fibrillation: Secondary | ICD-10-CM | POA: Diagnosis not present

## 2022-06-23 DIAGNOSIS — G894 Chronic pain syndrome: Secondary | ICD-10-CM | POA: Diagnosis not present

## 2022-06-23 DIAGNOSIS — K5909 Other constipation: Secondary | ICD-10-CM | POA: Diagnosis not present

## 2022-06-23 DIAGNOSIS — J440 Chronic obstructive pulmonary disease with acute lower respiratory infection: Secondary | ICD-10-CM | POA: Diagnosis not present

## 2022-06-23 DIAGNOSIS — J9611 Chronic respiratory failure with hypoxia: Secondary | ICD-10-CM | POA: Diagnosis not present

## 2022-06-23 DIAGNOSIS — J441 Chronic obstructive pulmonary disease with (acute) exacerbation: Secondary | ICD-10-CM | POA: Diagnosis not present

## 2022-06-24 DIAGNOSIS — J441 Chronic obstructive pulmonary disease with (acute) exacerbation: Secondary | ICD-10-CM | POA: Diagnosis not present

## 2022-06-24 DIAGNOSIS — R911 Solitary pulmonary nodule: Secondary | ICD-10-CM | POA: Diagnosis not present

## 2022-06-24 DIAGNOSIS — K219 Gastro-esophageal reflux disease without esophagitis: Secondary | ICD-10-CM | POA: Diagnosis not present

## 2022-06-24 DIAGNOSIS — Z8619 Personal history of other infectious and parasitic diseases: Secondary | ICD-10-CM | POA: Diagnosis not present

## 2022-06-24 DIAGNOSIS — K5909 Other constipation: Secondary | ICD-10-CM | POA: Diagnosis not present

## 2022-06-24 DIAGNOSIS — G894 Chronic pain syndrome: Secondary | ICD-10-CM | POA: Diagnosis not present

## 2022-06-24 DIAGNOSIS — S22000D Wedge compression fracture of unspecified thoracic vertebra, subsequent encounter for fracture with routine healing: Secondary | ICD-10-CM | POA: Diagnosis not present

## 2022-06-24 DIAGNOSIS — J189 Pneumonia, unspecified organism: Secondary | ICD-10-CM | POA: Diagnosis not present

## 2022-06-24 DIAGNOSIS — I480 Paroxysmal atrial fibrillation: Secondary | ICD-10-CM | POA: Diagnosis not present

## 2022-06-24 DIAGNOSIS — J440 Chronic obstructive pulmonary disease with acute lower respiratory infection: Secondary | ICD-10-CM | POA: Diagnosis not present

## 2022-06-24 DIAGNOSIS — Z85048 Personal history of other malignant neoplasm of rectum, rectosigmoid junction, and anus: Secondary | ICD-10-CM | POA: Diagnosis not present

## 2022-06-24 DIAGNOSIS — Z720 Tobacco use: Secondary | ICD-10-CM | POA: Diagnosis not present

## 2022-06-24 DIAGNOSIS — J9611 Chronic respiratory failure with hypoxia: Secondary | ICD-10-CM | POA: Diagnosis not present

## 2022-06-24 MED ORDER — BUSPIRONE HCL 10 MG PO TABS: 10.0000 mg | ORAL_TABLET | Freq: Three times a day (TID) | ORAL | 1 refills | Status: DC

## 2022-06-25 DIAGNOSIS — G894 Chronic pain syndrome: Secondary | ICD-10-CM | POA: Diagnosis not present

## 2022-06-25 DIAGNOSIS — K5909 Other constipation: Secondary | ICD-10-CM | POA: Diagnosis not present

## 2022-06-25 DIAGNOSIS — Z8619 Personal history of other infectious and parasitic diseases: Secondary | ICD-10-CM | POA: Diagnosis not present

## 2022-06-25 DIAGNOSIS — S22000D Wedge compression fracture of unspecified thoracic vertebra, subsequent encounter for fracture with routine healing: Secondary | ICD-10-CM | POA: Diagnosis not present

## 2022-06-25 DIAGNOSIS — J189 Pneumonia, unspecified organism: Secondary | ICD-10-CM | POA: Diagnosis not present

## 2022-06-25 DIAGNOSIS — J9611 Chronic respiratory failure with hypoxia: Secondary | ICD-10-CM | POA: Diagnosis not present

## 2022-06-25 DIAGNOSIS — K219 Gastro-esophageal reflux disease without esophagitis: Secondary | ICD-10-CM | POA: Diagnosis not present

## 2022-06-25 DIAGNOSIS — J440 Chronic obstructive pulmonary disease with acute lower respiratory infection: Secondary | ICD-10-CM | POA: Diagnosis not present

## 2022-06-25 DIAGNOSIS — Z720 Tobacco use: Secondary | ICD-10-CM | POA: Diagnosis not present

## 2022-06-25 DIAGNOSIS — Z85048 Personal history of other malignant neoplasm of rectum, rectosigmoid junction, and anus: Secondary | ICD-10-CM | POA: Diagnosis not present

## 2022-06-25 DIAGNOSIS — J441 Chronic obstructive pulmonary disease with (acute) exacerbation: Secondary | ICD-10-CM | POA: Diagnosis not present

## 2022-06-25 DIAGNOSIS — I480 Paroxysmal atrial fibrillation: Secondary | ICD-10-CM | POA: Diagnosis not present

## 2022-06-25 DIAGNOSIS — R911 Solitary pulmonary nodule: Secondary | ICD-10-CM | POA: Diagnosis not present

## 2022-06-26 DIAGNOSIS — S22000D Wedge compression fracture of unspecified thoracic vertebra, subsequent encounter for fracture with routine healing: Secondary | ICD-10-CM | POA: Diagnosis not present

## 2022-06-26 DIAGNOSIS — G894 Chronic pain syndrome: Secondary | ICD-10-CM | POA: Diagnosis not present

## 2022-06-26 DIAGNOSIS — J189 Pneumonia, unspecified organism: Secondary | ICD-10-CM | POA: Diagnosis not present

## 2022-06-26 DIAGNOSIS — Z720 Tobacco use: Secondary | ICD-10-CM | POA: Diagnosis not present

## 2022-06-26 DIAGNOSIS — J441 Chronic obstructive pulmonary disease with (acute) exacerbation: Secondary | ICD-10-CM | POA: Diagnosis not present

## 2022-06-26 DIAGNOSIS — K5909 Other constipation: Secondary | ICD-10-CM | POA: Diagnosis not present

## 2022-06-26 DIAGNOSIS — K219 Gastro-esophageal reflux disease without esophagitis: Secondary | ICD-10-CM | POA: Diagnosis not present

## 2022-06-26 DIAGNOSIS — Z8619 Personal history of other infectious and parasitic diseases: Secondary | ICD-10-CM | POA: Diagnosis not present

## 2022-06-26 DIAGNOSIS — I480 Paroxysmal atrial fibrillation: Secondary | ICD-10-CM | POA: Diagnosis not present

## 2022-06-26 DIAGNOSIS — J9611 Chronic respiratory failure with hypoxia: Secondary | ICD-10-CM | POA: Diagnosis not present

## 2022-06-26 DIAGNOSIS — J440 Chronic obstructive pulmonary disease with acute lower respiratory infection: Secondary | ICD-10-CM | POA: Diagnosis not present

## 2022-06-26 DIAGNOSIS — R911 Solitary pulmonary nodule: Secondary | ICD-10-CM | POA: Diagnosis not present

## 2022-06-26 DIAGNOSIS — Z85048 Personal history of other malignant neoplasm of rectum, rectosigmoid junction, and anus: Secondary | ICD-10-CM | POA: Diagnosis not present

## 2022-06-27 DIAGNOSIS — S22000D Wedge compression fracture of unspecified thoracic vertebra, subsequent encounter for fracture with routine healing: Secondary | ICD-10-CM | POA: Diagnosis not present

## 2022-06-27 DIAGNOSIS — J189 Pneumonia, unspecified organism: Secondary | ICD-10-CM | POA: Diagnosis not present

## 2022-06-27 DIAGNOSIS — K219 Gastro-esophageal reflux disease without esophagitis: Secondary | ICD-10-CM | POA: Diagnosis not present

## 2022-06-27 DIAGNOSIS — Z85048 Personal history of other malignant neoplasm of rectum, rectosigmoid junction, and anus: Secondary | ICD-10-CM | POA: Diagnosis not present

## 2022-06-27 DIAGNOSIS — J441 Chronic obstructive pulmonary disease with (acute) exacerbation: Secondary | ICD-10-CM | POA: Diagnosis not present

## 2022-06-27 DIAGNOSIS — J440 Chronic obstructive pulmonary disease with acute lower respiratory infection: Secondary | ICD-10-CM | POA: Diagnosis not present

## 2022-06-27 DIAGNOSIS — J9611 Chronic respiratory failure with hypoxia: Secondary | ICD-10-CM | POA: Diagnosis not present

## 2022-06-27 DIAGNOSIS — G894 Chronic pain syndrome: Secondary | ICD-10-CM | POA: Diagnosis not present

## 2022-06-27 DIAGNOSIS — K5909 Other constipation: Secondary | ICD-10-CM | POA: Diagnosis not present

## 2022-06-27 DIAGNOSIS — R911 Solitary pulmonary nodule: Secondary | ICD-10-CM | POA: Diagnosis not present

## 2022-06-27 DIAGNOSIS — Z720 Tobacco use: Secondary | ICD-10-CM | POA: Diagnosis not present

## 2022-06-27 DIAGNOSIS — I480 Paroxysmal atrial fibrillation: Secondary | ICD-10-CM | POA: Diagnosis not present

## 2022-06-27 DIAGNOSIS — Z8619 Personal history of other infectious and parasitic diseases: Secondary | ICD-10-CM | POA: Diagnosis not present

## 2022-06-28 DIAGNOSIS — Z720 Tobacco use: Secondary | ICD-10-CM | POA: Diagnosis not present

## 2022-06-28 DIAGNOSIS — J189 Pneumonia, unspecified organism: Secondary | ICD-10-CM | POA: Diagnosis not present

## 2022-06-28 DIAGNOSIS — Z85048 Personal history of other malignant neoplasm of rectum, rectosigmoid junction, and anus: Secondary | ICD-10-CM | POA: Diagnosis not present

## 2022-06-28 DIAGNOSIS — K5909 Other constipation: Secondary | ICD-10-CM | POA: Diagnosis not present

## 2022-06-28 DIAGNOSIS — G894 Chronic pain syndrome: Secondary | ICD-10-CM | POA: Diagnosis not present

## 2022-06-28 DIAGNOSIS — R911 Solitary pulmonary nodule: Secondary | ICD-10-CM | POA: Diagnosis not present

## 2022-06-28 DIAGNOSIS — K219 Gastro-esophageal reflux disease without esophagitis: Secondary | ICD-10-CM | POA: Diagnosis not present

## 2022-06-28 DIAGNOSIS — J441 Chronic obstructive pulmonary disease with (acute) exacerbation: Secondary | ICD-10-CM | POA: Diagnosis not present

## 2022-06-28 DIAGNOSIS — I480 Paroxysmal atrial fibrillation: Secondary | ICD-10-CM | POA: Diagnosis not present

## 2022-06-28 DIAGNOSIS — S22000D Wedge compression fracture of unspecified thoracic vertebra, subsequent encounter for fracture with routine healing: Secondary | ICD-10-CM | POA: Diagnosis not present

## 2022-06-28 DIAGNOSIS — J9611 Chronic respiratory failure with hypoxia: Secondary | ICD-10-CM | POA: Diagnosis not present

## 2022-06-28 DIAGNOSIS — Z8619 Personal history of other infectious and parasitic diseases: Secondary | ICD-10-CM | POA: Diagnosis not present

## 2022-06-28 DIAGNOSIS — J440 Chronic obstructive pulmonary disease with acute lower respiratory infection: Secondary | ICD-10-CM | POA: Diagnosis not present

## 2022-06-29 ENCOUNTER — Other Ambulatory Visit: Payer: Self-pay | Admitting: Physician Assistant

## 2022-06-29 DIAGNOSIS — K219 Gastro-esophageal reflux disease without esophagitis: Secondary | ICD-10-CM | POA: Diagnosis not present

## 2022-06-29 DIAGNOSIS — J9611 Chronic respiratory failure with hypoxia: Secondary | ICD-10-CM | POA: Diagnosis not present

## 2022-06-29 DIAGNOSIS — Z720 Tobacco use: Secondary | ICD-10-CM | POA: Diagnosis not present

## 2022-06-29 DIAGNOSIS — R911 Solitary pulmonary nodule: Secondary | ICD-10-CM | POA: Diagnosis not present

## 2022-06-29 DIAGNOSIS — K5909 Other constipation: Secondary | ICD-10-CM | POA: Diagnosis not present

## 2022-06-29 DIAGNOSIS — R4589 Other symptoms and signs involving emotional state: Secondary | ICD-10-CM

## 2022-06-29 DIAGNOSIS — G894 Chronic pain syndrome: Secondary | ICD-10-CM | POA: Diagnosis not present

## 2022-06-29 DIAGNOSIS — S22000D Wedge compression fracture of unspecified thoracic vertebra, subsequent encounter for fracture with routine healing: Secondary | ICD-10-CM | POA: Diagnosis not present

## 2022-06-29 DIAGNOSIS — J189 Pneumonia, unspecified organism: Secondary | ICD-10-CM | POA: Diagnosis not present

## 2022-06-29 DIAGNOSIS — I480 Paroxysmal atrial fibrillation: Secondary | ICD-10-CM | POA: Diagnosis not present

## 2022-06-29 DIAGNOSIS — Z85048 Personal history of other malignant neoplasm of rectum, rectosigmoid junction, and anus: Secondary | ICD-10-CM | POA: Diagnosis not present

## 2022-06-29 DIAGNOSIS — Z8619 Personal history of other infectious and parasitic diseases: Secondary | ICD-10-CM | POA: Diagnosis not present

## 2022-06-29 DIAGNOSIS — J441 Chronic obstructive pulmonary disease with (acute) exacerbation: Secondary | ICD-10-CM | POA: Diagnosis not present

## 2022-06-29 DIAGNOSIS — J440 Chronic obstructive pulmonary disease with acute lower respiratory infection: Secondary | ICD-10-CM | POA: Diagnosis not present

## 2022-06-30 DIAGNOSIS — K5909 Other constipation: Secondary | ICD-10-CM | POA: Diagnosis not present

## 2022-06-30 DIAGNOSIS — J189 Pneumonia, unspecified organism: Secondary | ICD-10-CM | POA: Diagnosis not present

## 2022-06-30 DIAGNOSIS — J441 Chronic obstructive pulmonary disease with (acute) exacerbation: Secondary | ICD-10-CM | POA: Diagnosis not present

## 2022-06-30 DIAGNOSIS — I480 Paroxysmal atrial fibrillation: Secondary | ICD-10-CM | POA: Diagnosis not present

## 2022-06-30 DIAGNOSIS — Z720 Tobacco use: Secondary | ICD-10-CM | POA: Diagnosis not present

## 2022-06-30 DIAGNOSIS — Z8619 Personal history of other infectious and parasitic diseases: Secondary | ICD-10-CM | POA: Diagnosis not present

## 2022-06-30 DIAGNOSIS — R911 Solitary pulmonary nodule: Secondary | ICD-10-CM | POA: Diagnosis not present

## 2022-06-30 DIAGNOSIS — K219 Gastro-esophageal reflux disease without esophagitis: Secondary | ICD-10-CM | POA: Diagnosis not present

## 2022-06-30 DIAGNOSIS — J9611 Chronic respiratory failure with hypoxia: Secondary | ICD-10-CM | POA: Diagnosis not present

## 2022-06-30 DIAGNOSIS — G894 Chronic pain syndrome: Secondary | ICD-10-CM | POA: Diagnosis not present

## 2022-06-30 DIAGNOSIS — Z85048 Personal history of other malignant neoplasm of rectum, rectosigmoid junction, and anus: Secondary | ICD-10-CM | POA: Diagnosis not present

## 2022-06-30 DIAGNOSIS — J440 Chronic obstructive pulmonary disease with acute lower respiratory infection: Secondary | ICD-10-CM | POA: Diagnosis not present

## 2022-06-30 DIAGNOSIS — S22000D Wedge compression fracture of unspecified thoracic vertebra, subsequent encounter for fracture with routine healing: Secondary | ICD-10-CM | POA: Diagnosis not present

## 2022-07-01 DIAGNOSIS — S22000D Wedge compression fracture of unspecified thoracic vertebra, subsequent encounter for fracture with routine healing: Secondary | ICD-10-CM | POA: Diagnosis not present

## 2022-07-01 DIAGNOSIS — G894 Chronic pain syndrome: Secondary | ICD-10-CM | POA: Diagnosis not present

## 2022-07-01 DIAGNOSIS — J9611 Chronic respiratory failure with hypoxia: Secondary | ICD-10-CM | POA: Diagnosis not present

## 2022-07-01 DIAGNOSIS — I480 Paroxysmal atrial fibrillation: Secondary | ICD-10-CM | POA: Diagnosis not present

## 2022-07-01 DIAGNOSIS — J440 Chronic obstructive pulmonary disease with acute lower respiratory infection: Secondary | ICD-10-CM | POA: Diagnosis not present

## 2022-07-01 DIAGNOSIS — Z720 Tobacco use: Secondary | ICD-10-CM | POA: Diagnosis not present

## 2022-07-01 DIAGNOSIS — K219 Gastro-esophageal reflux disease without esophagitis: Secondary | ICD-10-CM | POA: Diagnosis not present

## 2022-07-01 DIAGNOSIS — Z85048 Personal history of other malignant neoplasm of rectum, rectosigmoid junction, and anus: Secondary | ICD-10-CM | POA: Diagnosis not present

## 2022-07-01 DIAGNOSIS — R911 Solitary pulmonary nodule: Secondary | ICD-10-CM | POA: Diagnosis not present

## 2022-07-01 DIAGNOSIS — J189 Pneumonia, unspecified organism: Secondary | ICD-10-CM | POA: Diagnosis not present

## 2022-07-01 DIAGNOSIS — J441 Chronic obstructive pulmonary disease with (acute) exacerbation: Secondary | ICD-10-CM | POA: Diagnosis not present

## 2022-07-01 DIAGNOSIS — K5909 Other constipation: Secondary | ICD-10-CM | POA: Diagnosis not present

## 2022-07-01 DIAGNOSIS — Z8619 Personal history of other infectious and parasitic diseases: Secondary | ICD-10-CM | POA: Diagnosis not present

## 2022-07-02 ENCOUNTER — Telehealth: Payer: Self-pay | Admitting: Physician Assistant

## 2022-07-02 DIAGNOSIS — S22000D Wedge compression fracture of unspecified thoracic vertebra, subsequent encounter for fracture with routine healing: Secondary | ICD-10-CM | POA: Diagnosis not present

## 2022-07-02 DIAGNOSIS — K219 Gastro-esophageal reflux disease without esophagitis: Secondary | ICD-10-CM | POA: Diagnosis not present

## 2022-07-02 DIAGNOSIS — J440 Chronic obstructive pulmonary disease with acute lower respiratory infection: Secondary | ICD-10-CM | POA: Diagnosis not present

## 2022-07-02 DIAGNOSIS — J9611 Chronic respiratory failure with hypoxia: Secondary | ICD-10-CM | POA: Diagnosis not present

## 2022-07-02 DIAGNOSIS — J441 Chronic obstructive pulmonary disease with (acute) exacerbation: Secondary | ICD-10-CM | POA: Diagnosis not present

## 2022-07-02 DIAGNOSIS — Z720 Tobacco use: Secondary | ICD-10-CM | POA: Diagnosis not present

## 2022-07-02 DIAGNOSIS — Z8619 Personal history of other infectious and parasitic diseases: Secondary | ICD-10-CM | POA: Diagnosis not present

## 2022-07-02 DIAGNOSIS — I480 Paroxysmal atrial fibrillation: Secondary | ICD-10-CM | POA: Diagnosis not present

## 2022-07-02 DIAGNOSIS — G894 Chronic pain syndrome: Secondary | ICD-10-CM | POA: Diagnosis not present

## 2022-07-02 DIAGNOSIS — Z85048 Personal history of other malignant neoplasm of rectum, rectosigmoid junction, and anus: Secondary | ICD-10-CM | POA: Diagnosis not present

## 2022-07-02 DIAGNOSIS — J189 Pneumonia, unspecified organism: Secondary | ICD-10-CM | POA: Diagnosis not present

## 2022-07-02 DIAGNOSIS — R911 Solitary pulmonary nodule: Secondary | ICD-10-CM | POA: Diagnosis not present

## 2022-07-02 DIAGNOSIS — K5909 Other constipation: Secondary | ICD-10-CM | POA: Diagnosis not present

## 2022-07-02 NOTE — Telephone Encounter (Signed)
Company called they need cmm and auto testing sent to them on patient Contact [phone] advised to sign a roi

## 2022-07-02 NOTE — Telephone Encounter (Signed)
[facility] spoke w/Jace via the Authorizations Dept and informed him that [facility] doesn't write the orders for her Oxygen or her supplies.   Advised him that they will need to contact her pulmonologist office Camelia Eng NP or Mingo Amber NP to get the orders signed. I gave him the phone # [phone] and fax # [phone]. He stated that he would contact their office to correct this.

## 2022-07-03 DIAGNOSIS — R911 Solitary pulmonary nodule: Secondary | ICD-10-CM | POA: Diagnosis not present

## 2022-07-03 DIAGNOSIS — J440 Chronic obstructive pulmonary disease with acute lower respiratory infection: Secondary | ICD-10-CM | POA: Diagnosis not present

## 2022-07-03 DIAGNOSIS — G894 Chronic pain syndrome: Secondary | ICD-10-CM | POA: Diagnosis not present

## 2022-07-03 DIAGNOSIS — J9611 Chronic respiratory failure with hypoxia: Secondary | ICD-10-CM | POA: Diagnosis not present

## 2022-07-03 DIAGNOSIS — Z720 Tobacco use: Secondary | ICD-10-CM | POA: Diagnosis not present

## 2022-07-03 DIAGNOSIS — K219 Gastro-esophageal reflux disease without esophagitis: Secondary | ICD-10-CM | POA: Diagnosis not present

## 2022-07-03 DIAGNOSIS — Z8619 Personal history of other infectious and parasitic diseases: Secondary | ICD-10-CM | POA: Diagnosis not present

## 2022-07-03 DIAGNOSIS — J189 Pneumonia, unspecified organism: Secondary | ICD-10-CM | POA: Diagnosis not present

## 2022-07-03 DIAGNOSIS — Z85048 Personal history of other malignant neoplasm of rectum, rectosigmoid junction, and anus: Secondary | ICD-10-CM | POA: Diagnosis not present

## 2022-07-03 DIAGNOSIS — J441 Chronic obstructive pulmonary disease with (acute) exacerbation: Secondary | ICD-10-CM | POA: Diagnosis not present

## 2022-07-03 DIAGNOSIS — S22000D Wedge compression fracture of unspecified thoracic vertebra, subsequent encounter for fracture with routine healing: Secondary | ICD-10-CM | POA: Diagnosis not present

## 2022-07-03 DIAGNOSIS — I480 Paroxysmal atrial fibrillation: Secondary | ICD-10-CM | POA: Diagnosis not present

## 2022-07-03 DIAGNOSIS — K5909 Other constipation: Secondary | ICD-10-CM | POA: Diagnosis not present

## 2022-07-04 DIAGNOSIS — Z720 Tobacco use: Secondary | ICD-10-CM | POA: Diagnosis not present

## 2022-07-04 DIAGNOSIS — K219 Gastro-esophageal reflux disease without esophagitis: Secondary | ICD-10-CM | POA: Diagnosis not present

## 2022-07-04 DIAGNOSIS — S22000D Wedge compression fracture of unspecified thoracic vertebra, subsequent encounter for fracture with routine healing: Secondary | ICD-10-CM | POA: Diagnosis not present

## 2022-07-04 DIAGNOSIS — R911 Solitary pulmonary nodule: Secondary | ICD-10-CM | POA: Diagnosis not present

## 2022-07-04 DIAGNOSIS — K5909 Other constipation: Secondary | ICD-10-CM | POA: Diagnosis not present

## 2022-07-04 DIAGNOSIS — J189 Pneumonia, unspecified organism: Secondary | ICD-10-CM | POA: Diagnosis not present

## 2022-07-04 DIAGNOSIS — J441 Chronic obstructive pulmonary disease with (acute) exacerbation: Secondary | ICD-10-CM | POA: Diagnosis not present

## 2022-07-04 DIAGNOSIS — J440 Chronic obstructive pulmonary disease with acute lower respiratory infection: Secondary | ICD-10-CM | POA: Diagnosis not present

## 2022-07-04 DIAGNOSIS — G894 Chronic pain syndrome: Secondary | ICD-10-CM | POA: Diagnosis not present

## 2022-07-04 DIAGNOSIS — I480 Paroxysmal atrial fibrillation: Secondary | ICD-10-CM | POA: Diagnosis not present

## 2022-07-04 DIAGNOSIS — Z85048 Personal history of other malignant neoplasm of rectum, rectosigmoid junction, and anus: Secondary | ICD-10-CM | POA: Diagnosis not present

## 2022-07-04 DIAGNOSIS — J9611 Chronic respiratory failure with hypoxia: Secondary | ICD-10-CM | POA: Diagnosis not present

## 2022-07-04 DIAGNOSIS — Z8619 Personal history of other infectious and parasitic diseases: Secondary | ICD-10-CM | POA: Diagnosis not present

## 2022-07-05 DIAGNOSIS — I480 Paroxysmal atrial fibrillation: Secondary | ICD-10-CM | POA: Diagnosis not present

## 2022-07-05 DIAGNOSIS — J9611 Chronic respiratory failure with hypoxia: Secondary | ICD-10-CM | POA: Diagnosis not present

## 2022-07-05 DIAGNOSIS — S22000D Wedge compression fracture of unspecified thoracic vertebra, subsequent encounter for fracture with routine healing: Secondary | ICD-10-CM | POA: Diagnosis not present

## 2022-07-05 DIAGNOSIS — J440 Chronic obstructive pulmonary disease with acute lower respiratory infection: Secondary | ICD-10-CM | POA: Diagnosis not present

## 2022-07-05 DIAGNOSIS — J189 Pneumonia, unspecified organism: Secondary | ICD-10-CM | POA: Diagnosis not present

## 2022-07-05 DIAGNOSIS — K5909 Other constipation: Secondary | ICD-10-CM | POA: Diagnosis not present

## 2022-07-05 DIAGNOSIS — Z8619 Personal history of other infectious and parasitic diseases: Secondary | ICD-10-CM | POA: Diagnosis not present

## 2022-07-05 DIAGNOSIS — J441 Chronic obstructive pulmonary disease with (acute) exacerbation: Secondary | ICD-10-CM | POA: Diagnosis not present

## 2022-07-05 DIAGNOSIS — Z720 Tobacco use: Secondary | ICD-10-CM | POA: Diagnosis not present

## 2022-07-05 DIAGNOSIS — K219 Gastro-esophageal reflux disease without esophagitis: Secondary | ICD-10-CM | POA: Diagnosis not present

## 2022-07-05 DIAGNOSIS — G894 Chronic pain syndrome: Secondary | ICD-10-CM | POA: Diagnosis not present

## 2022-07-05 DIAGNOSIS — R911 Solitary pulmonary nodule: Secondary | ICD-10-CM | POA: Diagnosis not present

## 2022-07-05 DIAGNOSIS — Z85048 Personal history of other malignant neoplasm of rectum, rectosigmoid junction, and anus: Secondary | ICD-10-CM | POA: Diagnosis not present

## 2022-07-06 DIAGNOSIS — Z8619 Personal history of other infectious and parasitic diseases: Secondary | ICD-10-CM | POA: Diagnosis not present

## 2022-07-06 DIAGNOSIS — J440 Chronic obstructive pulmonary disease with acute lower respiratory infection: Secondary | ICD-10-CM | POA: Diagnosis not present

## 2022-07-06 DIAGNOSIS — S22000D Wedge compression fracture of unspecified thoracic vertebra, subsequent encounter for fracture with routine healing: Secondary | ICD-10-CM | POA: Diagnosis not present

## 2022-07-06 DIAGNOSIS — R911 Solitary pulmonary nodule: Secondary | ICD-10-CM | POA: Diagnosis not present

## 2022-07-06 DIAGNOSIS — J189 Pneumonia, unspecified organism: Secondary | ICD-10-CM | POA: Diagnosis not present

## 2022-07-06 DIAGNOSIS — I480 Paroxysmal atrial fibrillation: Secondary | ICD-10-CM | POA: Diagnosis not present

## 2022-07-06 DIAGNOSIS — J9611 Chronic respiratory failure with hypoxia: Secondary | ICD-10-CM | POA: Diagnosis not present

## 2022-07-06 DIAGNOSIS — K5909 Other constipation: Secondary | ICD-10-CM | POA: Diagnosis not present

## 2022-07-06 DIAGNOSIS — K219 Gastro-esophageal reflux disease without esophagitis: Secondary | ICD-10-CM | POA: Diagnosis not present

## 2022-07-06 DIAGNOSIS — G894 Chronic pain syndrome: Secondary | ICD-10-CM | POA: Diagnosis not present

## 2022-07-06 DIAGNOSIS — Z720 Tobacco use: Secondary | ICD-10-CM | POA: Diagnosis not present

## 2022-07-06 DIAGNOSIS — J441 Chronic obstructive pulmonary disease with (acute) exacerbation: Secondary | ICD-10-CM | POA: Diagnosis not present

## 2022-07-06 DIAGNOSIS — Z85048 Personal history of other malignant neoplasm of rectum, rectosigmoid junction, and anus: Secondary | ICD-10-CM | POA: Diagnosis not present

## 2022-07-07 DIAGNOSIS — Z720 Tobacco use: Secondary | ICD-10-CM | POA: Diagnosis not present

## 2022-07-07 DIAGNOSIS — J441 Chronic obstructive pulmonary disease with (acute) exacerbation: Secondary | ICD-10-CM | POA: Diagnosis not present

## 2022-07-07 DIAGNOSIS — K219 Gastro-esophageal reflux disease without esophagitis: Secondary | ICD-10-CM | POA: Diagnosis not present

## 2022-07-07 DIAGNOSIS — J189 Pneumonia, unspecified organism: Secondary | ICD-10-CM | POA: Diagnosis not present

## 2022-07-07 DIAGNOSIS — Z8619 Personal history of other infectious and parasitic diseases: Secondary | ICD-10-CM | POA: Diagnosis not present

## 2022-07-07 DIAGNOSIS — I480 Paroxysmal atrial fibrillation: Secondary | ICD-10-CM | POA: Diagnosis not present

## 2022-07-07 DIAGNOSIS — Z85048 Personal history of other malignant neoplasm of rectum, rectosigmoid junction, and anus: Secondary | ICD-10-CM | POA: Diagnosis not present

## 2022-07-07 DIAGNOSIS — R911 Solitary pulmonary nodule: Secondary | ICD-10-CM | POA: Diagnosis not present

## 2022-07-07 DIAGNOSIS — K5909 Other constipation: Secondary | ICD-10-CM | POA: Diagnosis not present

## 2022-07-07 DIAGNOSIS — G894 Chronic pain syndrome: Secondary | ICD-10-CM | POA: Diagnosis not present

## 2022-07-07 DIAGNOSIS — J440 Chronic obstructive pulmonary disease with acute lower respiratory infection: Secondary | ICD-10-CM | POA: Diagnosis not present

## 2022-07-07 DIAGNOSIS — S22000D Wedge compression fracture of unspecified thoracic vertebra, subsequent encounter for fracture with routine healing: Secondary | ICD-10-CM | POA: Diagnosis not present

## 2022-07-07 DIAGNOSIS — J9611 Chronic respiratory failure with hypoxia: Secondary | ICD-10-CM | POA: Diagnosis not present

## 2022-07-08 DIAGNOSIS — S22000D Wedge compression fracture of unspecified thoracic vertebra, subsequent encounter for fracture with routine healing: Secondary | ICD-10-CM | POA: Diagnosis not present

## 2022-07-08 DIAGNOSIS — I480 Paroxysmal atrial fibrillation: Secondary | ICD-10-CM | POA: Diagnosis not present

## 2022-07-08 DIAGNOSIS — Z85048 Personal history of other malignant neoplasm of rectum, rectosigmoid junction, and anus: Secondary | ICD-10-CM | POA: Diagnosis not present

## 2022-07-08 DIAGNOSIS — J189 Pneumonia, unspecified organism: Secondary | ICD-10-CM | POA: Diagnosis not present

## 2022-07-08 DIAGNOSIS — Z720 Tobacco use: Secondary | ICD-10-CM | POA: Diagnosis not present

## 2022-07-08 DIAGNOSIS — J9611 Chronic respiratory failure with hypoxia: Secondary | ICD-10-CM | POA: Diagnosis not present

## 2022-07-08 DIAGNOSIS — Z8619 Personal history of other infectious and parasitic diseases: Secondary | ICD-10-CM | POA: Diagnosis not present

## 2022-07-08 DIAGNOSIS — K5909 Other constipation: Secondary | ICD-10-CM | POA: Diagnosis not present

## 2022-07-08 DIAGNOSIS — R911 Solitary pulmonary nodule: Secondary | ICD-10-CM | POA: Diagnosis not present

## 2022-07-08 DIAGNOSIS — J441 Chronic obstructive pulmonary disease with (acute) exacerbation: Secondary | ICD-10-CM | POA: Diagnosis not present

## 2022-07-08 DIAGNOSIS — G894 Chronic pain syndrome: Secondary | ICD-10-CM | POA: Diagnosis not present

## 2022-07-08 DIAGNOSIS — J440 Chronic obstructive pulmonary disease with acute lower respiratory infection: Secondary | ICD-10-CM | POA: Diagnosis not present

## 2022-07-08 DIAGNOSIS — K219 Gastro-esophageal reflux disease without esophagitis: Secondary | ICD-10-CM | POA: Diagnosis not present

## 2022-07-09 DIAGNOSIS — K219 Gastro-esophageal reflux disease without esophagitis: Secondary | ICD-10-CM | POA: Diagnosis not present

## 2022-07-09 DIAGNOSIS — K5909 Other constipation: Secondary | ICD-10-CM | POA: Diagnosis not present

## 2022-07-09 DIAGNOSIS — J9611 Chronic respiratory failure with hypoxia: Secondary | ICD-10-CM | POA: Diagnosis not present

## 2022-07-09 DIAGNOSIS — S22000D Wedge compression fracture of unspecified thoracic vertebra, subsequent encounter for fracture with routine healing: Secondary | ICD-10-CM | POA: Diagnosis not present

## 2022-07-09 DIAGNOSIS — J440 Chronic obstructive pulmonary disease with acute lower respiratory infection: Secondary | ICD-10-CM | POA: Diagnosis not present

## 2022-07-09 DIAGNOSIS — J189 Pneumonia, unspecified organism: Secondary | ICD-10-CM | POA: Diagnosis not present

## 2022-07-09 DIAGNOSIS — R911 Solitary pulmonary nodule: Secondary | ICD-10-CM | POA: Diagnosis not present

## 2022-07-09 DIAGNOSIS — Z720 Tobacco use: Secondary | ICD-10-CM | POA: Diagnosis not present

## 2022-07-09 DIAGNOSIS — Z85048 Personal history of other malignant neoplasm of rectum, rectosigmoid junction, and anus: Secondary | ICD-10-CM | POA: Diagnosis not present

## 2022-07-09 DIAGNOSIS — Z8619 Personal history of other infectious and parasitic diseases: Secondary | ICD-10-CM | POA: Diagnosis not present

## 2022-07-09 DIAGNOSIS — I480 Paroxysmal atrial fibrillation: Secondary | ICD-10-CM | POA: Diagnosis not present

## 2022-07-09 DIAGNOSIS — G894 Chronic pain syndrome: Secondary | ICD-10-CM | POA: Diagnosis not present

## 2022-07-09 DIAGNOSIS — J441 Chronic obstructive pulmonary disease with (acute) exacerbation: Secondary | ICD-10-CM | POA: Diagnosis not present

## 2022-07-10 DIAGNOSIS — J440 Chronic obstructive pulmonary disease with acute lower respiratory infection: Secondary | ICD-10-CM | POA: Diagnosis not present

## 2022-07-10 DIAGNOSIS — Z720 Tobacco use: Secondary | ICD-10-CM | POA: Diagnosis not present

## 2022-07-10 DIAGNOSIS — I480 Paroxysmal atrial fibrillation: Secondary | ICD-10-CM | POA: Diagnosis not present

## 2022-07-10 DIAGNOSIS — Z8619 Personal history of other infectious and parasitic diseases: Secondary | ICD-10-CM | POA: Diagnosis not present

## 2022-07-10 DIAGNOSIS — Z85048 Personal history of other malignant neoplasm of rectum, rectosigmoid junction, and anus: Secondary | ICD-10-CM | POA: Diagnosis not present

## 2022-07-10 DIAGNOSIS — K5909 Other constipation: Secondary | ICD-10-CM | POA: Diagnosis not present

## 2022-07-10 DIAGNOSIS — S22000D Wedge compression fracture of unspecified thoracic vertebra, subsequent encounter for fracture with routine healing: Secondary | ICD-10-CM | POA: Diagnosis not present

## 2022-07-10 DIAGNOSIS — K219 Gastro-esophageal reflux disease without esophagitis: Secondary | ICD-10-CM | POA: Diagnosis not present

## 2022-07-10 DIAGNOSIS — J189 Pneumonia, unspecified organism: Secondary | ICD-10-CM | POA: Diagnosis not present

## 2022-07-10 DIAGNOSIS — R911 Solitary pulmonary nodule: Secondary | ICD-10-CM | POA: Diagnosis not present

## 2022-07-10 DIAGNOSIS — G894 Chronic pain syndrome: Secondary | ICD-10-CM | POA: Diagnosis not present

## 2022-07-10 DIAGNOSIS — J9611 Chronic respiratory failure with hypoxia: Secondary | ICD-10-CM | POA: Diagnosis not present

## 2022-07-10 DIAGNOSIS — J441 Chronic obstructive pulmonary disease with (acute) exacerbation: Secondary | ICD-10-CM | POA: Diagnosis not present

## 2022-07-11 DIAGNOSIS — Z85048 Personal history of other malignant neoplasm of rectum, rectosigmoid junction, and anus: Secondary | ICD-10-CM | POA: Diagnosis not present

## 2022-07-11 DIAGNOSIS — Z720 Tobacco use: Secondary | ICD-10-CM | POA: Diagnosis not present

## 2022-07-11 DIAGNOSIS — G894 Chronic pain syndrome: Secondary | ICD-10-CM | POA: Diagnosis not present

## 2022-07-11 DIAGNOSIS — R911 Solitary pulmonary nodule: Secondary | ICD-10-CM | POA: Diagnosis not present

## 2022-07-11 DIAGNOSIS — K219 Gastro-esophageal reflux disease without esophagitis: Secondary | ICD-10-CM | POA: Diagnosis not present

## 2022-07-11 DIAGNOSIS — J9611 Chronic respiratory failure with hypoxia: Secondary | ICD-10-CM | POA: Diagnosis not present

## 2022-07-11 DIAGNOSIS — S22000D Wedge compression fracture of unspecified thoracic vertebra, subsequent encounter for fracture with routine healing: Secondary | ICD-10-CM | POA: Diagnosis not present

## 2022-07-11 DIAGNOSIS — K5909 Other constipation: Secondary | ICD-10-CM | POA: Diagnosis not present

## 2022-07-11 DIAGNOSIS — Z8619 Personal history of other infectious and parasitic diseases: Secondary | ICD-10-CM | POA: Diagnosis not present

## 2022-07-11 DIAGNOSIS — J441 Chronic obstructive pulmonary disease with (acute) exacerbation: Secondary | ICD-10-CM | POA: Diagnosis not present

## 2022-07-11 DIAGNOSIS — I480 Paroxysmal atrial fibrillation: Secondary | ICD-10-CM | POA: Diagnosis not present

## 2022-07-11 DIAGNOSIS — J189 Pneumonia, unspecified organism: Secondary | ICD-10-CM | POA: Diagnosis not present

## 2022-07-11 DIAGNOSIS — J440 Chronic obstructive pulmonary disease with acute lower respiratory infection: Secondary | ICD-10-CM | POA: Diagnosis not present

## 2022-07-12 DIAGNOSIS — J189 Pneumonia, unspecified organism: Secondary | ICD-10-CM | POA: Diagnosis not present

## 2022-07-12 DIAGNOSIS — J440 Chronic obstructive pulmonary disease with acute lower respiratory infection: Secondary | ICD-10-CM | POA: Diagnosis not present

## 2022-07-12 DIAGNOSIS — Z85048 Personal history of other malignant neoplasm of rectum, rectosigmoid junction, and anus: Secondary | ICD-10-CM | POA: Diagnosis not present

## 2022-07-12 DIAGNOSIS — K219 Gastro-esophageal reflux disease without esophagitis: Secondary | ICD-10-CM | POA: Diagnosis not present

## 2022-07-12 DIAGNOSIS — J9611 Chronic respiratory failure with hypoxia: Secondary | ICD-10-CM | POA: Diagnosis not present

## 2022-07-12 DIAGNOSIS — J441 Chronic obstructive pulmonary disease with (acute) exacerbation: Secondary | ICD-10-CM | POA: Diagnosis not present

## 2022-07-12 DIAGNOSIS — K5909 Other constipation: Secondary | ICD-10-CM | POA: Diagnosis not present

## 2022-07-12 DIAGNOSIS — I480 Paroxysmal atrial fibrillation: Secondary | ICD-10-CM | POA: Diagnosis not present

## 2022-07-12 DIAGNOSIS — Z720 Tobacco use: Secondary | ICD-10-CM | POA: Diagnosis not present

## 2022-07-12 DIAGNOSIS — R911 Solitary pulmonary nodule: Secondary | ICD-10-CM | POA: Diagnosis not present

## 2022-07-12 DIAGNOSIS — Z8619 Personal history of other infectious and parasitic diseases: Secondary | ICD-10-CM | POA: Diagnosis not present

## 2022-07-12 DIAGNOSIS — G894 Chronic pain syndrome: Secondary | ICD-10-CM | POA: Diagnosis not present

## 2022-07-12 DIAGNOSIS — S22000D Wedge compression fracture of unspecified thoracic vertebra, subsequent encounter for fracture with routine healing: Secondary | ICD-10-CM | POA: Diagnosis not present

## 2022-07-13 DIAGNOSIS — R911 Solitary pulmonary nodule: Secondary | ICD-10-CM | POA: Diagnosis not present

## 2022-07-13 DIAGNOSIS — K219 Gastro-esophageal reflux disease without esophagitis: Secondary | ICD-10-CM | POA: Diagnosis not present

## 2022-07-13 DIAGNOSIS — Z720 Tobacco use: Secondary | ICD-10-CM | POA: Diagnosis not present

## 2022-07-13 DIAGNOSIS — J441 Chronic obstructive pulmonary disease with (acute) exacerbation: Secondary | ICD-10-CM | POA: Diagnosis not present

## 2022-07-13 DIAGNOSIS — J189 Pneumonia, unspecified organism: Secondary | ICD-10-CM | POA: Diagnosis not present

## 2022-07-13 DIAGNOSIS — Z85048 Personal history of other malignant neoplasm of rectum, rectosigmoid junction, and anus: Secondary | ICD-10-CM | POA: Diagnosis not present

## 2022-07-13 DIAGNOSIS — K5909 Other constipation: Secondary | ICD-10-CM | POA: Diagnosis not present

## 2022-07-13 DIAGNOSIS — J440 Chronic obstructive pulmonary disease with acute lower respiratory infection: Secondary | ICD-10-CM | POA: Diagnosis not present

## 2022-07-13 DIAGNOSIS — Z8619 Personal history of other infectious and parasitic diseases: Secondary | ICD-10-CM | POA: Diagnosis not present

## 2022-07-13 DIAGNOSIS — J9611 Chronic respiratory failure with hypoxia: Secondary | ICD-10-CM | POA: Diagnosis not present

## 2022-07-13 DIAGNOSIS — G894 Chronic pain syndrome: Secondary | ICD-10-CM | POA: Diagnosis not present

## 2022-07-13 DIAGNOSIS — I480 Paroxysmal atrial fibrillation: Secondary | ICD-10-CM | POA: Diagnosis not present

## 2022-07-13 DIAGNOSIS — S22000D Wedge compression fracture of unspecified thoracic vertebra, subsequent encounter for fracture with routine healing: Secondary | ICD-10-CM | POA: Diagnosis not present

## 2022-07-14 DIAGNOSIS — J440 Chronic obstructive pulmonary disease with acute lower respiratory infection: Secondary | ICD-10-CM | POA: Diagnosis not present

## 2022-07-14 DIAGNOSIS — K219 Gastro-esophageal reflux disease without esophagitis: Secondary | ICD-10-CM | POA: Diagnosis not present

## 2022-07-14 DIAGNOSIS — Z85048 Personal history of other malignant neoplasm of rectum, rectosigmoid junction, and anus: Secondary | ICD-10-CM | POA: Diagnosis not present

## 2022-07-14 DIAGNOSIS — J189 Pneumonia, unspecified organism: Secondary | ICD-10-CM | POA: Diagnosis not present

## 2022-07-14 DIAGNOSIS — I480 Paroxysmal atrial fibrillation: Secondary | ICD-10-CM | POA: Diagnosis not present

## 2022-07-14 DIAGNOSIS — S22000D Wedge compression fracture of unspecified thoracic vertebra, subsequent encounter for fracture with routine healing: Secondary | ICD-10-CM | POA: Diagnosis not present

## 2022-07-14 DIAGNOSIS — R911 Solitary pulmonary nodule: Secondary | ICD-10-CM | POA: Diagnosis not present

## 2022-07-14 DIAGNOSIS — Z720 Tobacco use: Secondary | ICD-10-CM | POA: Diagnosis not present

## 2022-07-14 DIAGNOSIS — J9611 Chronic respiratory failure with hypoxia: Secondary | ICD-10-CM | POA: Diagnosis not present

## 2022-07-14 DIAGNOSIS — Z8619 Personal history of other infectious and parasitic diseases: Secondary | ICD-10-CM | POA: Diagnosis not present

## 2022-07-14 DIAGNOSIS — K5909 Other constipation: Secondary | ICD-10-CM | POA: Diagnosis not present

## 2022-07-14 DIAGNOSIS — J441 Chronic obstructive pulmonary disease with (acute) exacerbation: Secondary | ICD-10-CM | POA: Diagnosis not present

## 2022-07-14 DIAGNOSIS — G894 Chronic pain syndrome: Secondary | ICD-10-CM | POA: Diagnosis not present

## 2022-07-15 DIAGNOSIS — Z419 Encounter for procedure for purposes other than remedying health state, unspecified: Secondary | ICD-10-CM | POA: Diagnosis not present

## 2022-08-12 DIAGNOSIS — Z95828 Presence of other vascular implants and grafts: Secondary | ICD-10-CM | POA: Diagnosis not present

## 2022-08-12 DIAGNOSIS — D72829 Elevated white blood cell count, unspecified: Secondary | ICD-10-CM | POA: Diagnosis not present

## 2022-08-12 DIAGNOSIS — D735 Infarction of spleen: Secondary | ICD-10-CM | POA: Diagnosis not present

## 2022-08-12 DIAGNOSIS — C210 Malignant neoplasm of anus, unspecified: Secondary | ICD-10-CM | POA: Diagnosis not present

## 2022-08-12 DIAGNOSIS — D696 Thrombocytopenia, unspecified: Secondary | ICD-10-CM | POA: Diagnosis not present

## 2022-08-26 ENCOUNTER — Ambulatory Visit: Payer: Medicare Other | Admitting: Physician Assistant

## 2022-08-27 ENCOUNTER — Telehealth: Payer: Self-pay | Admitting: Physician Assistant

## 2022-08-29 ENCOUNTER — Telehealth: Payer: Self-pay | Admitting: Physician Assistant

## 2022-08-29 NOTE — Telephone Encounter (Signed)
Joni Reining called in yesterday to follow up on fax that was sent yesterday. Stated that they fax went to the landline and not fax number. Called to confirm fax was received this morning and Joni Reining stated that it was not.   Fax number: [phone]

## 2022-09-15 NOTE — Telephone Encounter (Signed)
Nichole with [facility] called.  She is following up on fax sent for Admitting Orders that needs pcp's signature. Nichole's contact [phone].

## 2022-09-15 DEATH — deceased

## 2022-10-04 ENCOUNTER — Other Ambulatory Visit: Payer: Self-pay | Admitting: Physician Assistant

## 2023-03-17 ENCOUNTER — Other Ambulatory Visit: Payer: Self-pay | Admitting: Physician Assistant

## 2023-11-09 IMAGING — DX DG CHEST 2V
2 series · 2 of 2 positions shown · non-contrast
Comparison: Chest x-ray and chest CT 02/05/2021

CLINICAL DATA: Shortness of breath.

EXAM:
CHEST - 2 VIEW

[chest pa]
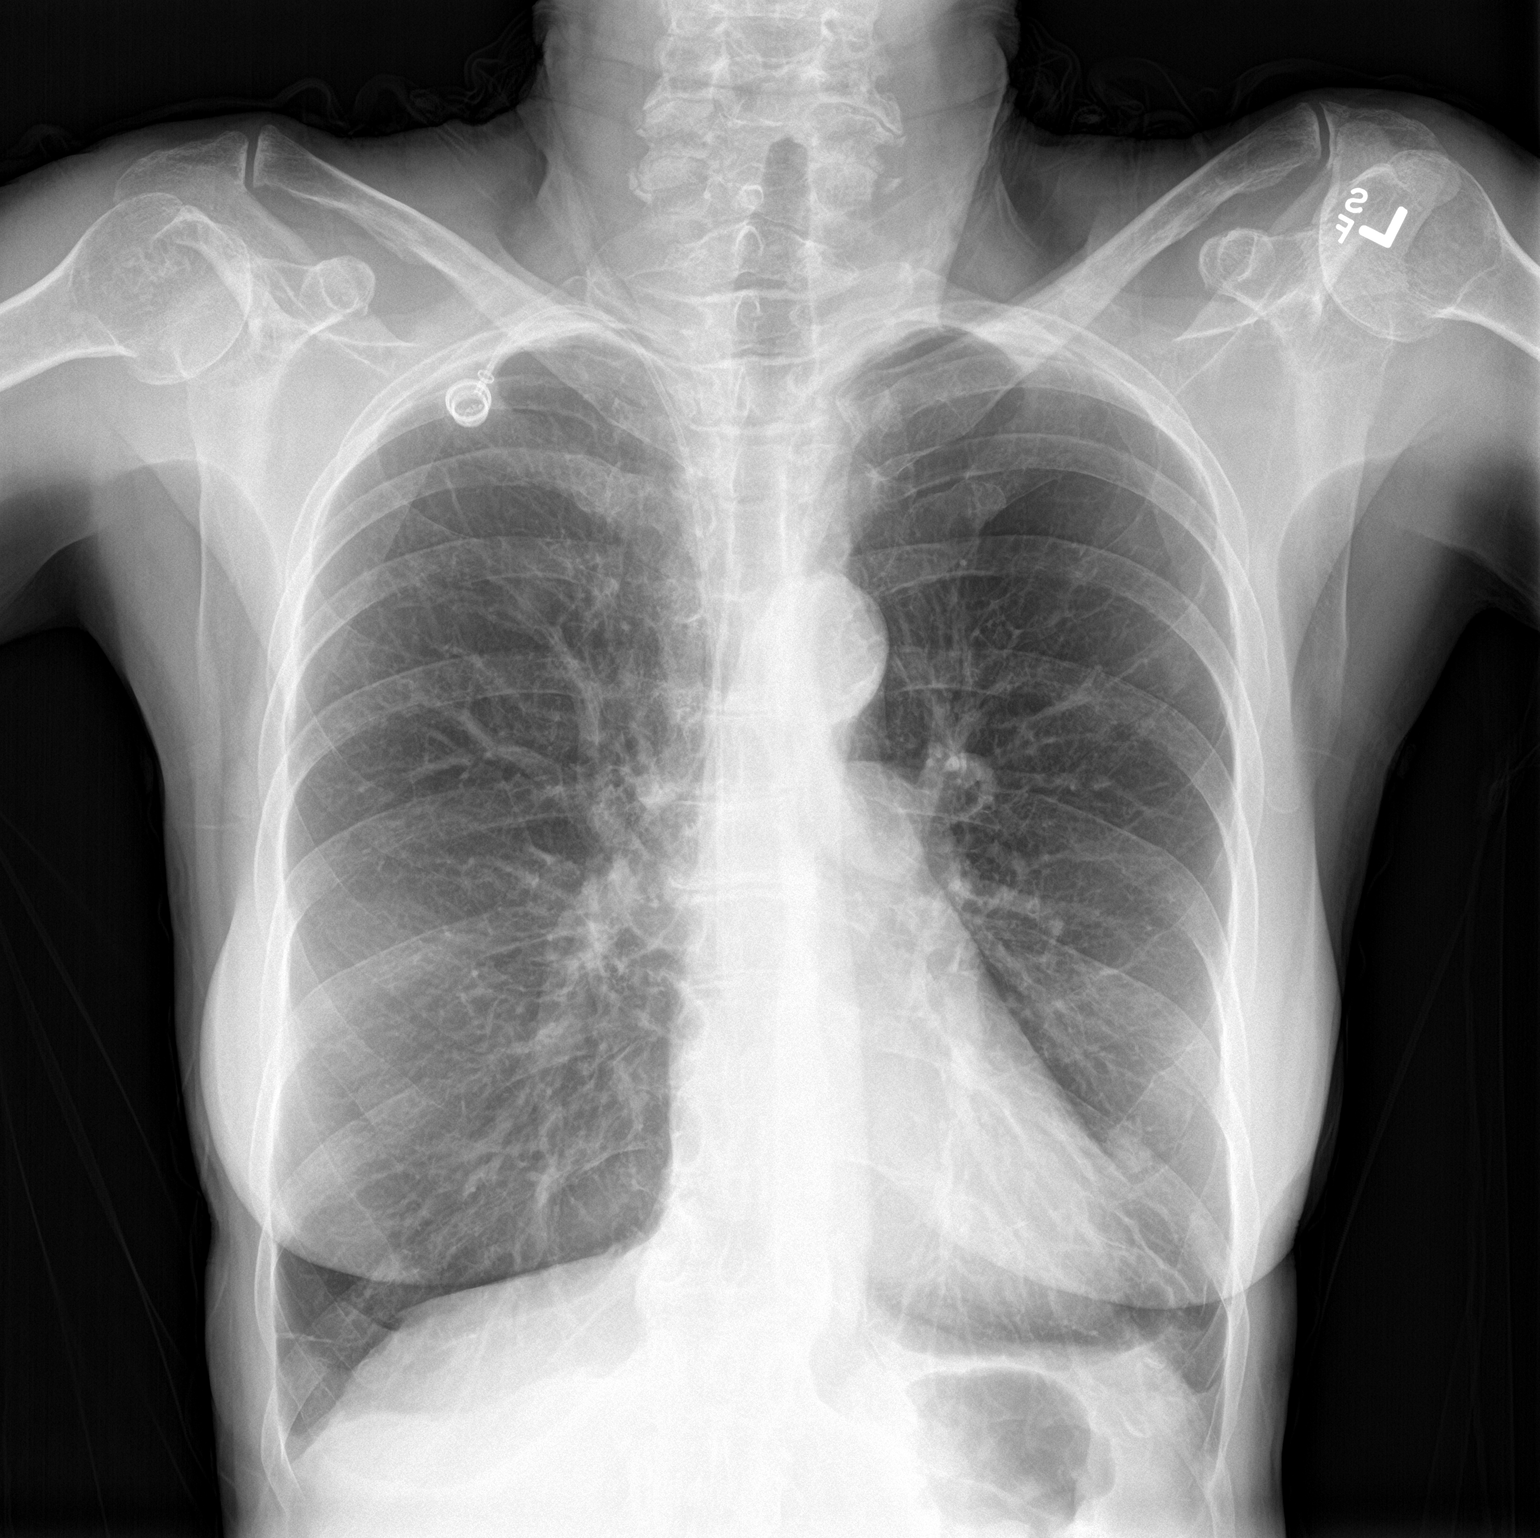

[chest lat]
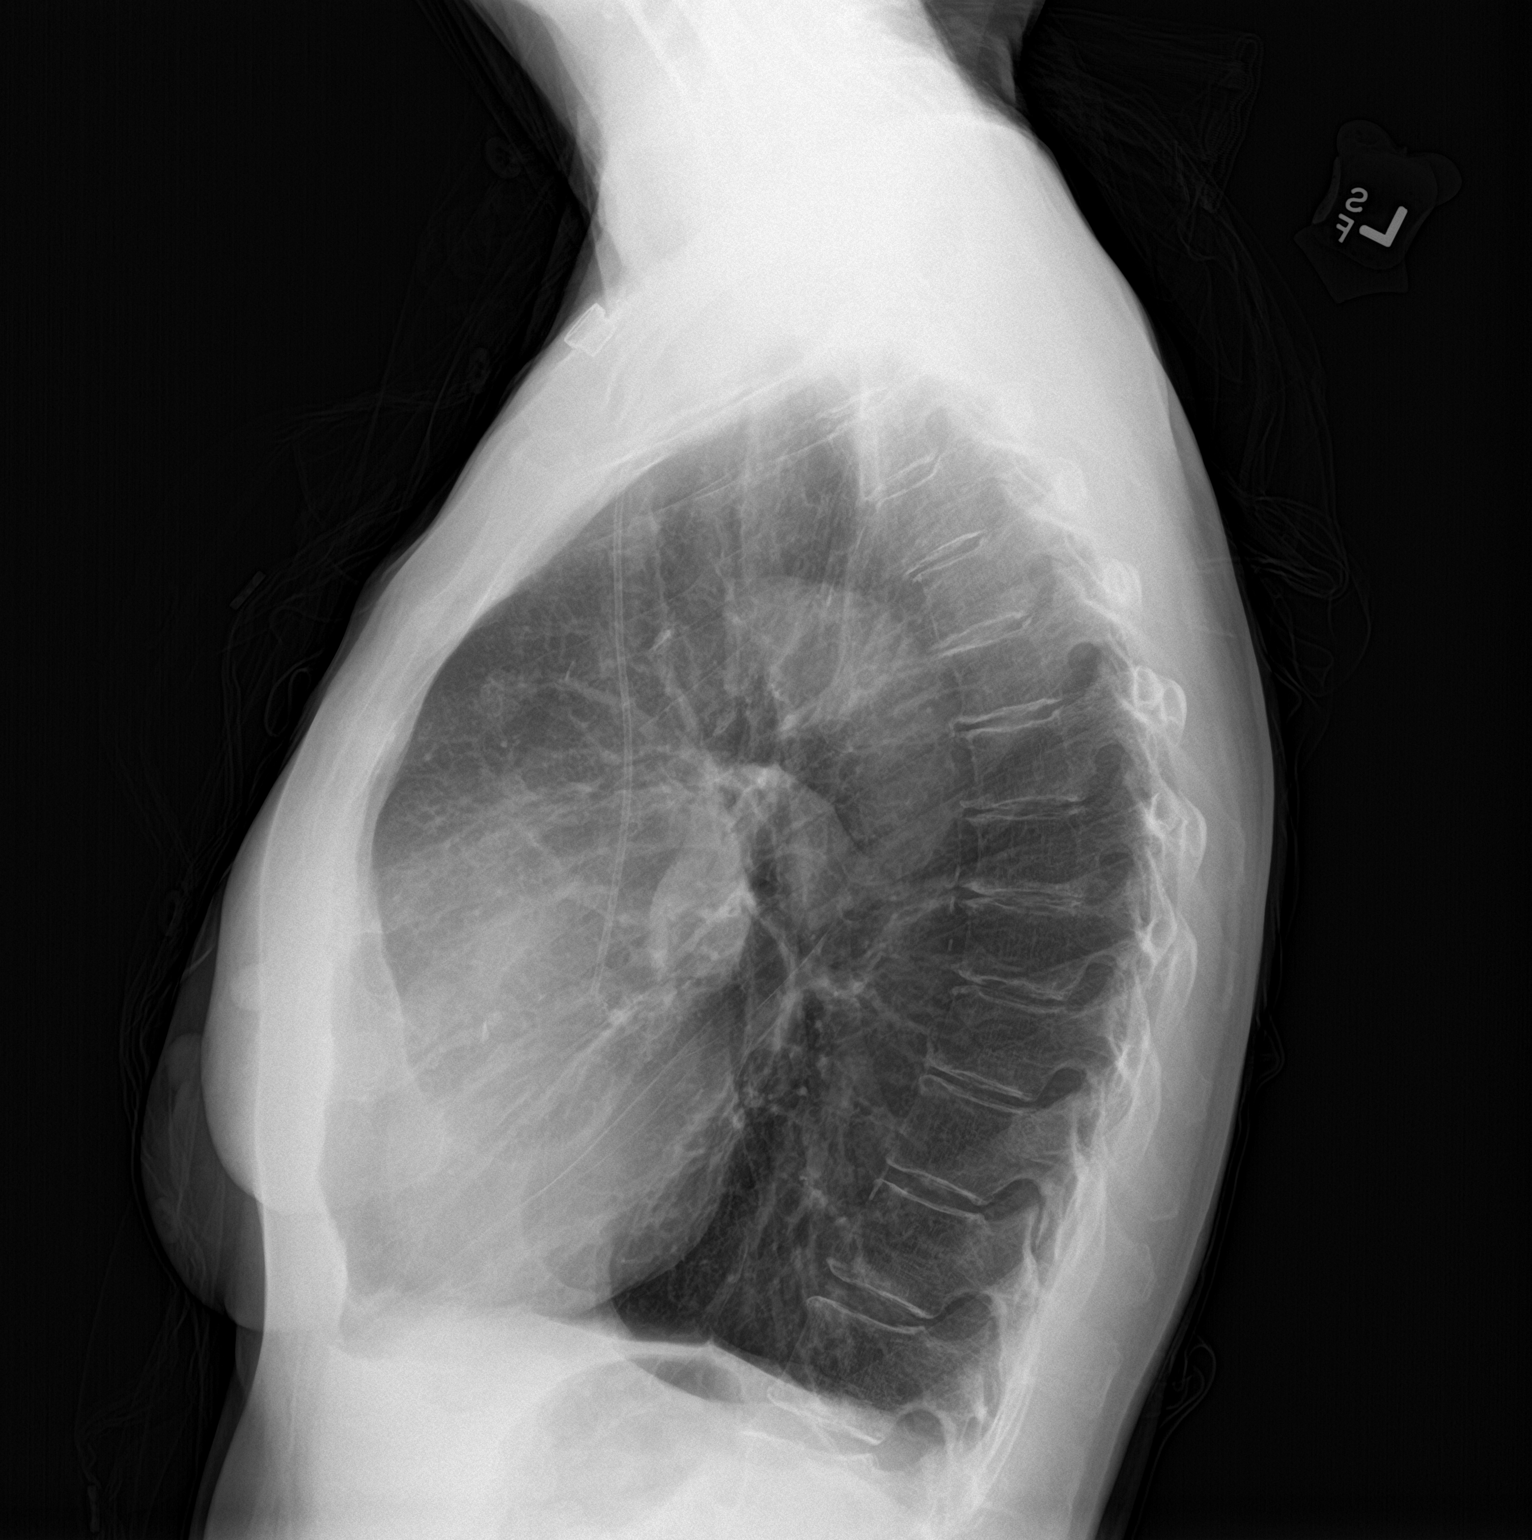

[2 of 2 positions shown; findings below may reference images not displayed]

FINDINGS: The cardiac silhouette, mediastinal and hilar contours are within
normal limits and stable. Stable right-sided Port-A-Cath. Stable
chronic lung changes with emphysema and pulmonary scarring. No acute
overlying pulmonary process. No worrisome pulmonary lesions or
pulmonary nodules. No pneumothorax. The bony thorax is intact.
IMPRESSION: Chronic lung changes but no acute overlying pulmonary process.
